# Patient Record
Sex: Female | Born: 1960
Health system: Southern US, Community
[De-identification: ages and names within clinical notes are randomized; demographics above are authoritative.]

## PROBLEM LIST (undated history)

## (undated) DIAGNOSIS — J45909 Unspecified asthma, uncomplicated: Secondary | ICD-10-CM

## (undated) DIAGNOSIS — F419 Anxiety disorder, unspecified: Secondary | ICD-10-CM

## (undated) DIAGNOSIS — E669 Obesity, unspecified: Secondary | ICD-10-CM

## (undated) DIAGNOSIS — I10 Essential (primary) hypertension: Secondary | ICD-10-CM

## (undated) DIAGNOSIS — I639 Cerebral infarction, unspecified: Secondary | ICD-10-CM

## (undated) HISTORY — DX: Anxiety disorder, unspecified: F41.9

## (undated) HISTORY — PX: NOSE SURGERY: SHX723

## (undated) HISTORY — PX: TUBAL LIGATION: SHX77

---

## 2007-08-18 ENCOUNTER — Emergency Department (HOSPITAL_COMMUNITY): Admission: EM | Admit: 2007-08-18 | Discharge: 2007-08-18 | Payer: Self-pay | Admitting: Emergency Medicine

## 2009-05-23 ENCOUNTER — Emergency Department (HOSPITAL_COMMUNITY): Admission: EM | Admit: 2009-05-23 | Discharge: 2009-05-23 | Payer: Self-pay | Admitting: Emergency Medicine

## 2011-02-15 ENCOUNTER — Emergency Department (HOSPITAL_COMMUNITY)
Admission: EM | Admit: 2011-02-15 | Discharge: 2011-02-15 | Disposition: A | Discharge status: Home / Self Care | Payer: Self-pay | Attending: Emergency Medicine | Admitting: Emergency Medicine

## 2011-02-15 DIAGNOSIS — R209 Unspecified disturbances of skin sensation: Secondary | ICD-10-CM | POA: Insufficient documentation

## 2011-02-15 DIAGNOSIS — R040 Epistaxis: Secondary | ICD-10-CM | POA: Insufficient documentation

## 2011-02-15 DIAGNOSIS — I10 Essential (primary) hypertension: Secondary | ICD-10-CM | POA: Insufficient documentation

## 2011-02-15 LAB — BASIC METABOLIC PANEL
BUN: 12 mg/dL (ref 6–23)
CO2: 25 mEq/L (ref 19–32)
Calcium: 9.5 mg/dL (ref 8.4–10.5)
Creatinine, Ser: 0.69 mg/dL (ref 0.4–1.2)
GFR calc non Af Amer: >60 mL/min (ref >60)
Glucose, Bld: 103 mg/dL — ABNORMAL HIGH (ref 70–99)
Sodium: 139 mEq/L (ref 135–145)

## 2011-02-15 LAB — DIFFERENTIAL
Basophils Absolute: 0 10*3/uL (ref 0.0–0.1)
Eosinophils Relative: 1 % (ref 0–5)
Lymphocytes Relative: 31 % (ref 12–46)
Monocytes Absolute: 0.7 10*3/uL (ref 0.1–1.0)
Monocytes Relative: 6 % (ref 3–12)
Neutro Abs: 6.9 10*3/uL (ref 1.7–7.7)

## 2011-02-15 LAB — CBC
HCT: 33.7 % — ABNORMAL LOW (ref 36.0–46.0)
Hemoglobin: 10.5 g/dL — ABNORMAL LOW (ref 12.0–15.0)
MCH: 23.6 pg — ABNORMAL LOW (ref 26.0–34.0)
MCHC: 31.2 g/dL (ref 30.0–36.0)
RDW: 15.6 % — ABNORMAL HIGH (ref 11.5–15.5)

## 2011-10-06 ENCOUNTER — Emergency Department (HOSPITAL_COMMUNITY): Payer: Self-pay

## 2011-10-06 ENCOUNTER — Emergency Department (HOSPITAL_COMMUNITY)
Admission: EM | Admit: 2011-10-06 | Discharge: 2011-10-06 | Disposition: A | Discharge status: Home / Self Care | Payer: Self-pay | Attending: Emergency Medicine | Admitting: Emergency Medicine

## 2011-10-06 DIAGNOSIS — I10 Essential (primary) hypertension: Secondary | ICD-10-CM | POA: Insufficient documentation

## 2011-10-06 DIAGNOSIS — M79609 Pain in unspecified limb: Secondary | ICD-10-CM | POA: Insufficient documentation

## 2011-10-06 DIAGNOSIS — S82839A Other fracture of upper and lower end of unspecified fibula, initial encounter for closed fracture: Secondary | ICD-10-CM | POA: Insufficient documentation

## 2011-10-06 DIAGNOSIS — X58XXXA Exposure to other specified factors, initial encounter: Secondary | ICD-10-CM | POA: Insufficient documentation

## 2011-10-06 DIAGNOSIS — M25569 Pain in unspecified knee: Secondary | ICD-10-CM | POA: Insufficient documentation

## 2012-01-04 HISTORY — PX: WRIST FRACTURE SURGERY: SHX121

## 2012-01-07 ENCOUNTER — Emergency Department (HOSPITAL_COMMUNITY): Payer: Self-pay

## 2012-01-07 ENCOUNTER — Encounter (HOSPITAL_COMMUNITY): Payer: Self-pay | Admitting: Emergency Medicine

## 2012-01-07 ENCOUNTER — Emergency Department (HOSPITAL_COMMUNITY)
Admission: EM | Admit: 2012-01-07 | Discharge: 2012-01-08 | Disposition: A | Discharge status: Home / Self Care | Payer: Self-pay | Attending: Emergency Medicine | Admitting: Emergency Medicine

## 2012-01-07 DIAGNOSIS — S52121A Displaced fracture of head of right radius, initial encounter for closed fracture: Secondary | ICD-10-CM

## 2012-01-07 DIAGNOSIS — F17200 Nicotine dependence, unspecified, uncomplicated: Secondary | ICD-10-CM | POA: Insufficient documentation

## 2012-01-07 DIAGNOSIS — S52509A Unspecified fracture of the lower end of unspecified radius, initial encounter for closed fracture: Secondary | ICD-10-CM | POA: Insufficient documentation

## 2012-01-07 DIAGNOSIS — Y92009 Unspecified place in unspecified non-institutional (private) residence as the place of occurrence of the external cause: Secondary | ICD-10-CM | POA: Insufficient documentation

## 2012-01-07 DIAGNOSIS — I10 Essential (primary) hypertension: Secondary | ICD-10-CM | POA: Insufficient documentation

## 2012-01-07 DIAGNOSIS — M25539 Pain in unspecified wrist: Secondary | ICD-10-CM | POA: Insufficient documentation

## 2012-01-07 DIAGNOSIS — M25569 Pain in unspecified knee: Secondary | ICD-10-CM | POA: Insufficient documentation

## 2012-01-07 DIAGNOSIS — W108XXA Fall (on) (from) other stairs and steps, initial encounter: Secondary | ICD-10-CM | POA: Insufficient documentation

## 2012-01-07 DIAGNOSIS — M25469 Effusion, unspecified knee: Secondary | ICD-10-CM | POA: Insufficient documentation

## 2012-01-07 DIAGNOSIS — M25439 Effusion, unspecified wrist: Secondary | ICD-10-CM | POA: Insufficient documentation

## 2012-01-07 DIAGNOSIS — W19XXXA Unspecified fall, initial encounter: Secondary | ICD-10-CM

## 2012-01-07 HISTORY — DX: Obesity, unspecified: E66.9

## 2012-01-07 HISTORY — DX: Essential (primary) hypertension: I10

## 2012-01-07 MED ORDER — HYDROCODONE-ACETAMINOPHEN 5-325 MG PO TABS: 1.0000 | ORAL_TABLET | ORAL | Status: DC | PRN

## 2012-01-07 MED ORDER — OXYCODONE-ACETAMINOPHEN 5-325 MG PO TABS
2.0000 | ORAL_TABLET | Freq: Once | ORAL | Status: AC
  Administered 2012-01-07: 2 via ORAL
  Filled 2012-01-07: qty 2

## 2012-01-07 NOTE — ED Notes (Signed)
Splint being applied  °

## 2012-01-07 NOTE — ED Notes (Signed)
Patient transported to X-ray 

## 2012-01-07 NOTE — ED Notes (Signed)
PT ambulated with a steady gait; VSS; splint applied and pulses intact; no signs of distress; A&Ox3; respirations even and unlabored; skin warm and dry; no questions.

## 2012-01-07 NOTE — ED Notes (Signed)
Ortho tech paged  

## 2012-01-07 NOTE — ED Provider Notes (Signed)
History     CSN: [id number]  Arrival date & time 01/07/12  2032   First MD Initiated Contact with Patient 01/07/12 2228      Chief Complaint  Patient presents with  . Fall     HPI  History provider the patient. Patient is a 51 year old female with history of hypertension obesity who presents after a fall with complaints of right wrist pain and right knee pain. Patient reports walking up or 3 steps into her house and tripping causing her to fall. She also states that her right knee seemed to give out on her some as she came down. Patient hit her right knee against a cement ground in her right wrist. She complains of greatest pain in the right wrist with swelling. Patient has been ambulatory since fall. She has not used any medications or had any treatment for pain. She denies any numbness or tingling in her hands and fingers. Patient denies any head trauma or LOC. Patient has no other complaints.    Past Medical History  Diagnosis Date  . Hypertension   . Obesity     History reviewed. No pertinent past surgical history.  No family history on file.  History  Substance Use Topics  . Smoking status: Current Everyday Smoker  . Smokeless tobacco: Not on file  . Alcohol Use: Yes    OB History    Grav Para Term Preterm Abortions TAB SAB Ect Mult Living                  Review of Systems  All other systems reviewed and are negative.    Allergies  Review of patient's allergies indicates no known allergies.  Home Medications   Current Outpatient Rx  Name Route Sig Dispense Refill  . NAPROXEN 500 MG PO TABS Oral Take 500 mg by mouth 2 (two) times daily with a meal.      BP 162/108  Pulse 91  Temp(Src) 98.2 F (36.8 C) (Oral)  Resp 22  SpO2 95%  LMP 12/06/2011  Physical Exam  Nursing note and vitals reviewed. Constitutional: She is oriented to person, place, and time. She appears well-developed and well-nourished. No distress.  HENT:  Head: Normocephalic and  atraumatic.  Cardiovascular: Normal rate and regular rhythm.   Pulmonary/Chest: Effort normal and breath sounds normal.  Abdominal: Soft.  Musculoskeletal:       Mild swelling to the lateral inferior aspect of right knee. Full range of motion of the knee. No crepitus or popping. No gross deformity the knee. She is able to bear weight and ambulate. Swelling over dorsal aspect of right wrist with tenderness to palpation. decreased range of motion and wrists secondary swelling and pain. Normal distal sensations and fingers with normal cap refill.  Neurological: She is alert and oriented to person, place, and time.  Skin: Skin is warm and dry. No rash noted.  Psychiatric: She has a normal mood and affect. Her behavior is normal.    ED Course  Procedures    Labs Reviewed - No data to display Dg Wrist Complete Right  01/07/2012  *RADIOLOGY REPORT*  Clinical Data: Pain and swelling post fall.  RIGHT WRIST - COMPLETE 3+ VIEW  Comparison: None.  Findings: There is a transverse minimally comminuted fracture of the distal radial metaphysis. A component of the fracture extends to the distal radial articular surface subchondral cortex, with approximate 2 mm distraction.  There is impaction of fracture fragments, with neutral angulation of the distal radial  articular surface.  There is an oblique minimally displaced fracture of the ulnar styloid.  Carpal rows intact.  Normal mineralization and alignment.  No significant osseous degenerative changes.  IMPRESSION:  1.  Comminuted impacted intra-articular fracture of the distal radius. 2.  Minimally-displaced ulnar styloid fracture.  Original Report Authenticated By: Osa Craver, M.D.   Dg Knee Complete 4 Views Right  01/07/2012  *RADIOLOGY REPORT*  Clinical Data: Right anterior knee pain status post fall  RIGHT KNEE - COMPLETE 4+ VIEW  Comparison: 10/06/2011  Findings: Tricompartmental DJD. Small joint effusion.  No acute fracture or dislocation  identified.  IMPRESSION: No acute fracture identified.  Tricompartmental DJD.If clinical concern for a fracture persists, recommend a repeat radiograph in 5- 10 days to evaluate for interval change or callus formation.  Original Report Authenticated By: Waneta Martins, M.D.     1. Fall   2. Fracture of radial head, right, closed       MDM  10:30 PM patient seen and evaluated. Patient no acute distress.   Patient discussed with attending physician and x-rays reviewed. Plan to put patient in Sugar tong splint and give orthopedic hand followup.     Angus Seller, [place] 01/08/12 [phone]

## 2012-01-07 NOTE — ED Notes (Signed)
Pt. fell today and injured right wrist , no loc , ambulatory, presents with right wrist swelling /pain .

## 2012-01-08 NOTE — ED Provider Notes (Signed)
Medical screening examination/treatment/procedure(s) were conducted as a shared visit with non-physician practitioner(s) and myself.  I personally evaluated the patient during the encounter    Celene Kras, MD 01/08/12 1102

## 2012-01-11 ENCOUNTER — Encounter (HOSPITAL_COMMUNITY): Payer: Self-pay

## 2012-01-11 ENCOUNTER — Encounter (HOSPITAL_COMMUNITY)
Admission: RE | Admit: 2012-01-11 | Discharge: 2012-01-11 | Disposition: A | Discharge status: Home / Self Care | Payer: Self-pay | Source: Ambulatory Visit | Attending: Orthopedic Surgery | Admitting: Orthopedic Surgery

## 2012-01-11 ENCOUNTER — Other Ambulatory Visit: Payer: Self-pay | Admitting: Orthopedic Surgery

## 2012-01-11 ENCOUNTER — Encounter (HOSPITAL_COMMUNITY)
Admission: RE | Admit: 2012-01-11 | Discharge: 2012-01-11 | Disposition: A | Discharge status: Home / Self Care | Payer: Self-pay | Source: Ambulatory Visit | Attending: Anesthesiology | Admitting: Anesthesiology

## 2012-01-11 ENCOUNTER — Other Ambulatory Visit: Payer: Self-pay

## 2012-01-11 LAB — COMPREHENSIVE METABOLIC PANEL
ALT: 21 U/L (ref 0–35)
AST: 22 U/L (ref 0–37)
CO2: 29 mEq/L (ref 19–32)
Calcium: 9.7 mg/dL (ref 8.4–10.5)
Chloride: 104 mEq/L (ref 96–112)
Creatinine, Ser: 0.66 mg/dL (ref 0.50–1.10)
GFR calc Af Amer: >90 mL/min (ref >90)
GFR calc non Af Amer: >90 mL/min (ref >90)
Glucose, Bld: 83 mg/dL (ref 70–99)
Sodium: 139 mEq/L (ref 135–145)
Total Bilirubin: 0.2 mg/dL — ABNORMAL LOW (ref 0.3–1.2)

## 2012-01-11 LAB — SURGICAL PCR SCREEN
MRSA, PCR: NEGATIVE
Staphylococcus aureus: NEGATIVE

## 2012-01-11 LAB — PROTIME-INR
INR: 1.02 (ref 0.00–1.49)
Prothrombin Time: 13.6 seconds (ref 11.6–15.2)

## 2012-01-11 LAB — CBC
MCH: 24.8 pg — ABNORMAL LOW (ref 26.0–34.0)
MCHC: 31.1 g/dL (ref 30.0–36.0)
Platelets: 266 10*3/uL (ref 150–400)

## 2012-01-11 NOTE — Progress Notes (Signed)
NOTIFIED ALLISON ZELENAK PA OF PATIENT  REPORTING DRINKING ALCOHOL DAILY AND REPORTING HYPERTENSION BUT HAS NO MEDICAL MD TO GIVE RX. LABS, CXR, EKG WERE DONE AS ORDERED.

## 2012-01-11 NOTE — Pre-Procedure Instructions (Signed)
20 SONNI BARSE  01/11/2012   Your procedure is scheduled on:  Saturday January 12, 2012  Report to [facility] at 0600 AM.  Call this number if you have problems the morning of surgery: [phone]   Remember:   Do not eat food:After Midnight.  May have clear liquids: up to 4 Hours before arrival.  (up to 2:00am)  Clear liquids include soda, tea, black coffee, apple or grape juice, broth.  Take these medicines the morning of surgery with A SIP OF WATER: hydrocodone   Do not wear jewelry, make-up or nail polish.  Do not wear lotions, powders, or perfumes. You may wear deodorant.  Do not shave 48 hours prior to surgery.  Do not bring valuables to the hospital.  Contacts, dentures or bridgework may not be worn into surgery.  Leave suitcase in the car. After surgery it may be brought to your room.  For patients admitted to the hospital, checkout time is 11:00 AM the day of discharge.   Patients discharged the day of surgery will not be allowed to drive home.  Name and phone number of your driver:  Family / friend  Special Instructions: CHG Shower Use Special Wash: 1/2 bottle night before surgery and 1/2 bottle morning of surgery.   Please read over the following fact sheets that you were given: Pain Booklet, Coughing and Deep Breathing, MRSA Information and Surgical Site Infection Prevention

## 2012-01-11 NOTE — Progress Notes (Signed)
REQUESTED ORDERS FROM DR [facility] OFFICE, [place] TO FAX.

## 2012-01-12 ENCOUNTER — Ambulatory Visit (HOSPITAL_COMMUNITY)
Admission: RE | Admit: 2012-01-12 | Discharge: 2012-01-12 | Disposition: A | Discharge status: Home / Self Care | Payer: Self-pay | Source: Ambulatory Visit | Attending: Orthopedic Surgery | Admitting: Orthopedic Surgery

## 2012-01-12 ENCOUNTER — Ambulatory Visit (HOSPITAL_COMMUNITY): Payer: Self-pay | Admitting: Anesthesiology

## 2012-01-12 ENCOUNTER — Encounter (HOSPITAL_COMMUNITY)
Admission: RE | Disposition: A | Discharge status: Home / Self Care | Payer: Self-pay | Source: Ambulatory Visit | Attending: Orthopedic Surgery

## 2012-01-12 ENCOUNTER — Encounter (HOSPITAL_COMMUNITY): Payer: Self-pay | Admitting: Anesthesiology

## 2012-01-12 ENCOUNTER — Encounter (HOSPITAL_COMMUNITY): Payer: Self-pay | Admitting: *Deleted

## 2012-01-12 DIAGNOSIS — Z01810 Encounter for preprocedural cardiovascular examination: Secondary | ICD-10-CM | POA: Insufficient documentation

## 2012-01-12 DIAGNOSIS — I10 Essential (primary) hypertension: Secondary | ICD-10-CM | POA: Insufficient documentation

## 2012-01-12 DIAGNOSIS — X58XXXA Exposure to other specified factors, initial encounter: Secondary | ICD-10-CM | POA: Insufficient documentation

## 2012-01-12 DIAGNOSIS — S52599A Other fractures of lower end of unspecified radius, initial encounter for closed fracture: Secondary | ICD-10-CM | POA: Insufficient documentation

## 2012-01-12 DIAGNOSIS — K219 Gastro-esophageal reflux disease without esophagitis: Secondary | ICD-10-CM | POA: Insufficient documentation

## 2012-01-12 DIAGNOSIS — E669 Obesity, unspecified: Secondary | ICD-10-CM | POA: Insufficient documentation

## 2012-01-12 DIAGNOSIS — S52501A Unspecified fracture of the lower end of right radius, initial encounter for closed fracture: Secondary | ICD-10-CM

## 2012-01-12 DIAGNOSIS — Z01818 Encounter for other preprocedural examination: Secondary | ICD-10-CM | POA: Insufficient documentation

## 2012-01-12 DIAGNOSIS — F17200 Nicotine dependence, unspecified, uncomplicated: Secondary | ICD-10-CM | POA: Insufficient documentation

## 2012-01-12 DIAGNOSIS — Z01812 Encounter for preprocedural laboratory examination: Secondary | ICD-10-CM | POA: Insufficient documentation

## 2012-01-12 HISTORY — PX: CARPAL TUNNEL RELEASE: SHX101

## 2012-01-12 SURGERY — OPEN REDUCTION INTERNAL FIXATION (ORIF) DISTAL RADIUS FRACTURE
Anesthesia: General | Laterality: Right

## 2012-01-12 MED ORDER — MIDAZOLAM HCL 5 MG/5ML IJ SOLN
INTRAMUSCULAR | Status: DC | PRN
  Administered 2012-01-12: 2 mg via INTRAVENOUS

## 2012-01-12 MED ORDER — CEFAZOLIN SODIUM 1-5 GM-% IV SOLN: 1.0000 g | INTRAVENOUS | Status: DC

## 2012-01-12 MED ORDER — CEFAZOLIN SODIUM-DEXTROSE 2-3 GM-% IV SOLR
INTRAVENOUS | Status: AC
  Administered 2012-01-12: 2 g via INTRAVENOUS
  Filled 2012-01-12: qty 50

## 2012-01-12 MED ORDER — LIDOCAINE HCL (CARDIAC) 20 MG/ML IV SOLN
INTRAVENOUS | Status: DC | PRN
  Administered 2012-01-12: 50 mg via INTRAVENOUS

## 2012-01-12 MED ORDER — OXYCODONE-ACETAMINOPHEN 5-325 MG PO TABS: 1.0000 | ORAL_TABLET | ORAL | Status: AC | PRN

## 2012-01-12 MED ORDER — ONDANSETRON HCL 4 MG/2ML IJ SOLN: 4.0000 mg | Freq: Four times a day (QID) | INTRAMUSCULAR | Status: DC | PRN

## 2012-01-12 MED ORDER — EPHEDRINE SULFATE 50 MG/ML IJ SOLN
INTRAMUSCULAR | Status: DC | PRN
  Administered 2012-01-12: 5 mg via INTRAVENOUS

## 2012-01-12 MED ORDER — LABETALOL HCL 5 MG/ML IV SOLN
INTRAVENOUS | Status: DC | PRN
  Administered 2012-01-12: 5 mg via INTRAVENOUS

## 2012-01-12 MED ORDER — HYDROMORPHONE HCL PF 1 MG/ML IJ SOLN
0.2500 mg | INTRAMUSCULAR | Status: DC | PRN
  Administered 2012-01-12 (×3): 0.5 mg via INTRAVENOUS

## 2012-01-12 MED ORDER — PROPOFOL 10 MG/ML IV EMUL
INTRAVENOUS | Status: DC | PRN
  Administered 2012-01-12: 160 mg via INTRAVENOUS

## 2012-01-12 MED ORDER — HYDROMORPHONE HCL PF 1 MG/ML IJ SOLN
INTRAMUSCULAR | Status: AC
  Administered 2012-01-12: 0.5 mg via INTRAVENOUS
  Filled 2012-01-12: qty 1

## 2012-01-12 MED ORDER — FENTANYL CITRATE 0.05 MG/ML IJ SOLN
INTRAMUSCULAR | Status: DC | PRN
  Administered 2012-01-12 (×2): 50 ug via INTRAVENOUS
  Administered 2012-01-12: 100 ug via INTRAVENOUS
  Administered 2012-01-12: 50 ug via INTRAVENOUS

## 2012-01-12 MED ORDER — BUPIVACAINE HCL (PF) 0.25 % IJ SOLN
INTRAMUSCULAR | Status: DC | PRN
  Administered 2012-01-12: 10 mL

## 2012-01-12 MED ORDER — CHLORHEXIDINE GLUCONATE 4 % EX LIQD: 60.0000 mL | Freq: Once | CUTANEOUS | Status: DC

## 2012-01-12 MED ORDER — LACTATED RINGERS IV SOLN
INTRAVENOUS | Status: DC | PRN
  Administered 2012-01-12 (×2): via INTRAVENOUS

## 2012-01-12 MED ORDER — ACETAMINOPHEN 10 MG/ML IV SOLN
1000.0000 mg | Freq: Once | INTRAVENOUS | Status: AC
  Administered 2012-01-12: 1000 mg via INTRAVENOUS

## 2012-01-12 MED ORDER — SUCCINYLCHOLINE CHLORIDE 20 MG/ML IJ SOLN
INTRAMUSCULAR | Status: DC | PRN
  Administered 2012-01-12: 120 mg via INTRAVENOUS

## 2012-01-12 MED ORDER — ACETAMINOPHEN 10 MG/ML IV SOLN
INTRAVENOUS | Status: AC
  Administered 2012-01-12: 1000 mg via INTRAVENOUS
  Filled 2012-01-12: qty 100

## 2012-01-12 MED ORDER — ONDANSETRON HCL 4 MG/2ML IJ SOLN
INTRAMUSCULAR | Status: DC | PRN
  Administered 2012-01-12: 4 mg via INTRAVENOUS

## 2012-01-12 SURGICAL SUPPLY — 52 items
APL SKNCLS STERI-STRIP NONHPOA (GAUZE/BANDAGES/DRESSINGS) ×1
BANDAGE ACE 4 STERILE (GAUZE/BANDAGES/DRESSINGS) ×1 IMPLANT
BANDAGE ELASTIC 3 VELCRO ST LF (GAUZE/BANDAGES/DRESSINGS) ×2 IMPLANT
BANDAGE GAUZE ELAST BULKY 4 IN (GAUZE/BANDAGES/DRESSINGS) ×2 IMPLANT
BENZOIN TINCTURE PRP APPL 2/3 (GAUZE/BANDAGES/DRESSINGS) ×1 IMPLANT
BNDG CMPR 9X4 STRL LF SNTH (GAUZE/BANDAGES/DRESSINGS) ×1
BNDG ESMARK 4X9 LF (GAUZE/BANDAGES/DRESSINGS) ×2 IMPLANT
CLOSURE STERI STRIP 1/2 X4 (GAUZE/BANDAGES/DRESSINGS) ×1 IMPLANT
CLOTH BEACON ORANGE TIMEOUT ST (SAFETY) ×2 IMPLANT
CORDS BIPOLAR (ELECTRODE) ×2 IMPLANT
COVER MAYO STAND STRL (DRAPES) IMPLANT
COVER SURGICAL LIGHT HANDLE (MISCELLANEOUS) ×2 IMPLANT
CUFF TOURNIQUET SINGLE 18IN (TOURNIQUET CUFF) IMPLANT
CUFF TOURNIQUET SINGLE 24IN (TOURNIQUET CUFF) IMPLANT
DRAPE SURG 17X23 STRL (DRAPES) ×2 IMPLANT
DURAPREP 26ML APPLICATOR (WOUND CARE) ×2 IMPLANT
GAUZE KERLIX 2  STERILE LF (GAUZE/BANDAGES/DRESSINGS) ×1 IMPLANT
GAUZE SPONGE 4X4 12PLY STRL LF (GAUZE/BANDAGES/DRESSINGS) ×1 IMPLANT
GAUZE XEROFORM 1X8 LF (GAUZE/BANDAGES/DRESSINGS) ×2 IMPLANT
GLOVE BIO SURGEON STRL SZ8.5 (GLOVE) ×2 IMPLANT
GOWN PREVENTION PLUS XXLARGE (GOWN DISPOSABLE) ×2 IMPLANT
GOWN SRG XL XLNG 56XLVL 4 (GOWN DISPOSABLE) ×1 IMPLANT
GOWN STRL NON-REIN LRG LVL3 (GOWN DISPOSABLE) ×2 IMPLANT
GOWN STRL NON-REIN XL XLG LVL4 (GOWN DISPOSABLE) ×2
KIT BASIN OR (CUSTOM PROCEDURE TRAY) ×2 IMPLANT
KIT ROOM TURNOVER OR (KITS) ×2 IMPLANT
NDL HYPO 25GX1X1/2 BEV (NEEDLE) IMPLANT
NEEDLE 22X1 1/2 (OR ONLY) (NEEDLE) IMPLANT
NEEDLE HYPO 25GX1X1/2 BEV (NEEDLE) IMPLANT
NS IRRIG 1000ML POUR BTL (IV SOLUTION) ×2 IMPLANT
PACK ORTHO EXTREMITY (CUSTOM PROCEDURE TRAY) ×2 IMPLANT
PAD ARMBOARD 7.5X6 YLW CONV (MISCELLANEOUS) ×4 IMPLANT
PAD CAST 4YDX4 CTTN HI CHSV (CAST SUPPLIES) ×1 IMPLANT
PADDING CAST COTTON 4X4 STRL (CAST SUPPLIES) ×2
PADDING WEBRIL 4 STERILE (GAUZE/BANDAGES/DRESSINGS) ×1 IMPLANT
PEG SUBCHONDRAL SMOOTH 2.0X20 (Peg) ×4 IMPLANT
PEG SUBCHONDRAL SMOOTH 2.0X22 (Peg) ×2 IMPLANT
PLATE F3 FRAG HI STR RT (Plate) ×1 IMPLANT
SCREW BN 12X3.5XNS CORT TI (Screw) IMPLANT
SCREW CORT 3.5X12 (Screw) ×2 IMPLANT
SCREW CORT 3.5X14 LNG (Screw) ×2 IMPLANT
SPONGE GAUZE 4X4 12PLY (GAUZE/BANDAGES/DRESSINGS) ×2 IMPLANT
STRIP CLOSURE SKIN 1/2X4 (GAUZE/BANDAGES/DRESSINGS) ×2 IMPLANT
SUT ETHILON 4 0 PS 2 18 (SUTURE) IMPLANT
SUT PROLENE 3 0 PS 2 (SUTURE) IMPLANT
SUT VIC AB 3-0 PS2 18 (SUTURE)
SUT VIC AB 3-0 PS2 18XBRD (SUTURE) IMPLANT
SYR CONTROL 10ML LL (SYRINGE) IMPLANT
TOWEL OR 17X24 6PK STRL BLUE (TOWEL DISPOSABLE) ×2 IMPLANT
TOWEL OR 17X26 10 PK STRL BLUE (TOWEL DISPOSABLE) ×2 IMPLANT
UNDERPAD 30X30 INCONTINENT (UNDERPADS AND DIAPERS) ×2 IMPLANT
WATER STERILE IRR 1000ML POUR (IV SOLUTION) ×2 IMPLANT

## 2012-01-12 NOTE — Progress Notes (Signed)
Dr. Chaney Malling updated concerning pain level. Acetominophen IV ordered and hung dw

## 2012-01-12 NOTE — Progress Notes (Signed)
Pt states pain still  9-10/10. resp rate and 02 sat dropping to lower levels for short intervals . Will hold additional pain med for now. Encouraging deep breathing and coughing. Discussed with patient and stated understanding.

## 2012-01-12 NOTE — Brief Op Note (Signed)
01/12/2012  8:43 AM  PATIENT:  Kathleen [name]  51 y.o. female  PRE-OPERATIVE DIAGNOSIS:  RIGHT DISTAL RADIUS FRACTURE  POST-OPERATIVE DIAGNOSIS:  right distal radius fracture  PROCEDURE:  Procedure(s): OPEN REDUCTION INTERNAL FIXATION (ORIF) DISTAL RADIAL FRACTURE CARPAL TUNNEL RELEASE  SURGEON:  Surgeon(s): Marlowe Shores, MD  PHYSICIAN ASSISTANT:   ASSISTANTS: none   ANESTHESIA:   general  EBL:     BLOOD ADMINISTERED:none  DRAINS: none   LOCAL MEDICATIONS USED:  MARCAINE 10CC  SPECIMEN:  No Specimen  DISPOSITION OF SPECIMEN:  N/A  COUNTS:  YES  TOURNIQUET:   Total Tourniquet Time Documented: Upper Arm (Right) - 48 minutes  DICTATION: .Other Dictation: Dictation Number [id number]  PLAN OF CARE: Discharge to home after PACU  PATIENT DISPOSITION:  PACU - hemodynamically stable.   Delay start of Pharmacological VTE agent (>24hrs) due to surgical blood loss or risk of bleeding:  {YES/NO/NOT APPLICABLE:20182

## 2012-01-12 NOTE — Anesthesia Preprocedure Evaluation (Addendum)
Anesthesia Evaluation  Patient identified by MRN, date of birth, ID band Patient awake    Reviewed: Allergy & Precautions, H&P , NPO status , Patient's Chart, lab work & pertinent test results  History of Anesthesia Complications (+) AWARENESS UNDER ANESTHESIA  Airway Mallampati: III  Neck ROM: limited    Dental   Pulmonary Current Smoker,          Cardiovascular hypertension,     Neuro/Psych    GI/Hepatic GERD-  Poorly Controlled,  Endo/Other    Renal/GU      Musculoskeletal   Abdominal   Peds  Hematology   Anesthesia Other Findings   Reproductive/Obstetrics                          Anesthesia Physical Anesthesia Plan  ASA: II  Anesthesia Plan: General   Post-op Pain Management:    Induction: Intravenous  Airway Management Planned: Oral ETT  Additional Equipment:   Intra-op Plan:   Post-operative Plan: Extubation in OR  Informed Consent: I have reviewed the patients History and Physical, chart, labs and discussed the procedure including the risks, benefits and alternatives for the proposed anesthesia with the patient or authorized representative who has indicated his/her understanding and acceptance.     Plan Discussed with: CRNA and Surgeon  Anesthesia Plan Comments:         Anesthesia Quick Evaluation

## 2012-01-12 NOTE — Anesthesia Procedure Notes (Signed)
Procedure Name: Intubation Date/Time: 01/12/2012 7:39 AM Performed by: Romie Minus Pre-anesthesia Checklist: Patient identified, Emergency Drugs available, Suction available and Patient being monitored Patient Re-evaluated:Patient Re-evaluated prior to inductionOxygen Delivery Method: Circle System Utilized Preoxygenation: Pre-oxygenation with 100% oxygen Intubation Type: IV induction and Rapid sequence Laryngoscope Size: Miller and 2 Grade View: Grade I Tube type: Oral Tube size: 7.5 mm Number of attempts: 1 Placement Confirmation: ETT inserted through vocal cords under direct vision,  positive ETCO2 and breath sounds checked- equal and bilateral Secured at: 21 cm Tube secured with: Tape Dental Injury: Teeth and Oropharynx as per pre-operative assessment

## 2012-01-12 NOTE — Anesthesia Postprocedure Evaluation (Signed)
Anesthesia Post Note  Patient: Kathleen [name]  Procedure(s) Performed:  OPEN REDUCTION INTERNAL FIXATION (ORIF) DISTAL RADIAL FRACTURE; CARPAL TUNNEL RELEASE  Anesthesia type: General  Patient location: PACU  Post pain: Pain level controlled and Adequate analgesia  Post assessment: Post-op Vital signs reviewed, Patient's Cardiovascular Status Stable, Respiratory Function Stable, Patent Airway and Pain level controlled  Last Vitals:  Filed Vitals:   01/12/12 0919  BP: 145/96  Pulse: 80  Temp:   Resp: 15    Post vital signs: Reviewed and stable  Level of consciousness: awake, alert  and oriented  Complications: No apparent anesthesia complications

## 2012-01-12 NOTE — Preoperative (Signed)
Beta Blockers   Reason not to administer Beta Blockers:Not Applicable 

## 2012-01-12 NOTE — Addendum Note (Signed)
Addendum  created 01/12/12 1610 by Raiford Simmonds, MD   Modules edited:Orders

## 2012-01-12 NOTE — Op Note (Signed)
NAMEINZA, MIKRUT               ACCOUNT NO.:  [id number]  MEDICAL RECORD NO.:  [id number]  LOCATION:  [facility]                         FACILITY:  [facility]  PHYSICIAN:  Artist Pais. Lerlene Treadwell, M.D.DATE OF BIRTH:  February 22, 1961  DATE OF PROCEDURE:  01/12/2012 DATE OF DISCHARGE:                              OPERATIVE REPORT   PREOPERATIVE DIAGNOSIS:  Displaced intra-articular fracture distal radius, right side.  POSTOPERATIVE DIAGNOSIS:  Displaced intra-articular fracture distal radius, right side.  PROCEDURE:  Open reduction and internal fixation above using DVR plate and screws, carpal tunnel release.  SURGEON:  Artist Pais. Mina Marble, MD  ASSISTANT:  None.  ANESTHESIA:  General.  TOURNIQUET TIME:  46 minutes.  COMPLICATIONS:  No complication.  DRAINS:  No drains.  DESCRIPTION OF PROCEDURE:  The patient was taken to the operating suite. After induction of adequate general anesthesia, right upper extremity was prepped and draped in usual sterile fashion.  An Esmarch was used to exsanguinate the limb.  Tourniquet was inflated to 250 mmHg.  At this point in time, standard DVR volar aspect approach of the distal radius was undertaken.  The skin was incised off the FCR tendon.  The FCR tendon sheath was incised, was tracked in the midline radial artery on lateral side.  Dissection was carried down to the level of pronator quadratus which was bilaterally stripped off.  X-rays revealing a volar displaced intra-articular fracture, 2-3 parts.  The brachioradialis was released off the distal fragment to aid in reduction.  Reduction was performed with the wrist being extended over rolled up towels.  Once this was done, a DVR plate was fastened to the volar aspect of the distal radius into the slotted hole, a screw was placed.  Intraoperative fluoroscopy was used to determine adequate position in AP and lateral view.  Remaining cortical screws were placed proximally followed by smooth pegs  distally.  Intraoperative fluoroscopy again revealed adequate reduction in AP, lateral, and oblique view.  At this point in time, the wound was thoroughly irrigated.  The median nerve was identified in the proximal aspect of the wound traced in the carpal canal.  A path was created dorsal and volar to the transverse carpal ligament.  The median nerve was protected with a Therapist, nutritional and then the transverse carpal ligament was divided with curved blunt scissors. Canal was inspected.  No osseous lesion was present.  It was irrigated. The wounds were closed in layers of 2-0 undyed Vicryl to reapproximate the pronator quadratus and a 3-0 Prolene subcuticular stitch on the skin.  Steri-Strips, 4x4s, fluffs, and a volar splint was applied.  The patient tolerated the procedure well and went to recovery room in stable fashion.     Artist Pais Mina Marble, M.D.     MAW/MEDQ  D:  01/12/2012  T:  01/12/2012  Job:  [id number]

## 2012-01-12 NOTE — H&P (Signed)
Kathleen [name] is an 52 y.o. female.   Chief Complaint: right wrist pain HPI: 51 y/o female s/p fall with displaced right distal radius fracture  Past Medical History  Diagnosis Date  . Obesity   . Hypertension     NO MED MD TO GIVE RX    Past Surgical History  Procedure Date  . Nose surgery     BROKEN   MANY YRS AGO   . Tubal ligation   . Cesarean section     History reviewed. No pertinent family history. Social History:  reports that she has been smoking.  She does not have any smokeless tobacco history on file. She reports that she drinks alcohol. She reports that she does not use illicit drugs.  Allergies:  Allergies  Allergen Reactions  . Hydrocodone Itching    Medications Prior to Admission  Medication Dose Route Frequency Provider Last Rate Last Dose  . ceFAZolin (ANCEF) 2-3 GM-% IVPB SOLR           . ceFAZolin (ANCEF) IVPB 1 g/50 mL premix  1 g Intravenous 60 min Pre-Op Marlowe Shores, MD      . chlorhexidine (HIBICLENS) 4 % liquid 4 application  60 mL Topical Once Marlowe Shores, MD      . chlorhexidine (HIBICLENS) 4 % liquid 4 application  60 mL Topical Once        Medications Prior to Admission  Medication Sig Dispense Refill  . HYDROcodone-acetaminophen (NORCO) 5-325 MG per tablet Take 1 tablet by mouth every 4 (four) hours as needed. For pain        Results for orders placed during the hospital encounter of 01/11/12 (from the past 48 hour(s))  COMPREHENSIVE METABOLIC PANEL     Status: Abnormal   Collection Time   01/11/12  2:17 PM      Component Value Range Comment   Sodium 139  135 - 145 (mEq/L)    Potassium 3.6  3.5 - 5.1 (mEq/L)    Chloride 104  96 - 112 (mEq/L)    CO2 29  19 - 32 (mEq/L)    Glucose, Bld 83  70 - 99 (mg/dL)    BUN 15  6 - 23 (mg/dL)    Creatinine, Ser 9.56  0.50 - 1.10 (mg/dL)    Calcium 9.7  8.4 - 10.5 (mg/dL)    Total Protein 7.6  6.0 - 8.3 (g/dL)    Albumin 3.4 (*) 3.5 - 5.2 (g/dL)    AST 22  0 - 37 (U/L)    ALT 21  0  - 35 (U/L)    Alkaline Phosphatase 94  39 - 117 (U/L)    Total Bilirubin 0.2 (*) 0.3 - 1.2 (mg/dL)    GFR calc non Af Amer >90  >90 (mL/min)    GFR calc Af Amer >90  >90 (mL/min)   CBC     Status: Abnormal   Collection Time   01/11/12  2:17 PM      Component Value Range Comment   WBC 9.2  4.0 - 10.5 (K/uL)    RBC 4.91  3.87 - 5.11 (MIL/uL)    Hemoglobin 12.2  12.0 - 15.0 (g/dL)    HCT 21.3  08.6 - 57.8 (%)    MCV 79.8  78.0 - 100.0 (fL)    MCH 24.8 (*) 26.0 - 34.0 (pg)    MCHC 31.1  30.0 - 36.0 (g/dL)    RDW 46.9  62.9 - 52.8 (%)  Platelets 266  150 - 400 (K/uL)   PROTIME-INR     Status: Normal   Collection Time   01/11/12  2:17 PM      Component Value Range Comment   Prothrombin Time 13.6  11.6 - 15.2 (seconds)    INR 1.02  0.00 - 1.49    APTT     Status: Normal   Collection Time   01/11/12  2:17 PM      Component Value Range Comment   aPTT 25  24 - 37 (seconds)   HCG, SERUM, QUALITATIVE     Status: Normal   Collection Time   01/11/12  2:17 PM      Component Value Range Comment   Preg, Serum NEGATIVE  NEGATIVE    SURGICAL PCR SCREEN     Status: Normal   Collection Time   01/11/12  2:19 PM      Component Value Range Comment   MRSA, PCR NEGATIVE  NEGATIVE     Staphylococcus aureus NEGATIVE  NEGATIVE     Dg Chest 2 View  01/11/2012  *RADIOLOGY REPORT*  Clinical Data: Preop for carpal tunnel surgery, smoking history  CHEST - 2 VIEW  Comparison: None.  Findings: No active infiltrate or effusion is seen.  The heart is mildly enlarged. Mediastinal contours appear normal.  No acute bony abnormality is seen with degenerative change in the mid to lower thoracic spine.  IMPRESSION: No active lung disease.  Original Report Authenticated By: Juline Patch, M.D.    Review of Systems  Constitutional: Negative.   HENT: Negative.   Eyes: Negative.   Respiratory: Negative.   Cardiovascular: Negative.   Gastrointestinal: Negative.   Genitourinary: Negative.   Musculoskeletal: Negative.     Skin: Negative.   Neurological: Negative.   Endo/Heme/Allergies: Negative.   Psychiatric/Behavioral: Negative.     Blood pressure 159/111, pulse 76, temperature 98.1 F (36.7 C), temperature source Oral, resp. rate 22, last menstrual period 12/06/2011, SpO2 99.00%. Physical Exam  Constitutional: She is oriented to person, place, and time. She appears well-developed and well-nourished.  HENT:  Head: Normocephalic and atraumatic.  Eyes: Pupils are equal, round, and reactive to light.  Neck: Normal range of motion.  Cardiovascular: Normal rate.   Respiratory: Effort normal.  Musculoskeletal:       Right wrist: She exhibits tenderness, bony tenderness, swelling and deformity.  Neurological: She is alert and oriented to person, place, and time.  Skin: Skin is warm.  Psychiatric: She has a normal mood and affect. Her speech is normal and behavior is normal. Thought content normal.     Assessment/Plan 51 y/o female with right distal radius fracture  Plan orif  Kamau Weatherall A 01/12/2012, 7:22 AM

## 2012-01-12 NOTE — Discharge Instructions (Signed)
Wrist Fracture Your caregiver has diagnosed you as having a fracture of the wrist. A fracture is a break in the bone or bones. A cast or splint is used to protect and keep your injured bone(s) from moving. The cast or splint will usually be on for about 5 to 6 weeks. One of the bones of the wrist (the navicular bone) often does not show up as a fracture on X-ray until later or in the healing phase. With this bone your caregiver will often cast as though it is fractured even if not seen on the X-ray. HOME CARE INSTRUCTIONS   To lessen the swelling, keep the injured part elevated while sitting or lying down. Keeping the injury above the level of your heart (the center of the chest) will decrease swelling and pain.   Do not wear rings or jewelry on the injured hand or wrist.   Apply ice to the injury for 15 to 20 minutes, 3 to 4 times per day while awake for 2 days. Put the ice in a plastic bag and place a thin towel between the bag of ice and your cast.   If you have a plaster or fiberglass cast:   Do not try to scratch the skin under the cast using sharp or pointed objects.   Check the skin around the cast every day. You may put lotion on any red or sore areas.   Keep your cast dry and clean.   If you have a plaster splint:   Wear the splint as directed.   You may loosen the elastic around the splint if your fingers become numb, tingle, or turn cold or blue.   If you have been put in a removable splint, wear and use as directed.   Do not use powders or deodorants in or around the cast or splint.   Do not remove padding from your cast or splint.   Do not put pressure on any part of your cast or splint. It may break. Rest your cast or splint only on a pillow the first 24 hours until it is fully hardened.   Gently move your fingers often, so they do not get stiff.   Do not remove the splint unless directed by your caregiver. Casts must be removed by an orthopedist.   Your cast or  splint can be protected during bathing with a plastic bag. Do not lower the cast or splint into water.   Only take over-the-counter or prescription medicines for pain, discomfort, or fever as directed by your caregiver.   Follow up with your caregiver as directed.  SEEK IMMEDIATE MEDICAL CARE IF:   Your cast or splint gets damaged or breaks.   Your cast or splint feels too tight or loose.   You have increased pain, not controlled with medication.   You have increased swelling.   Your skin or nails below the injury turn blue or grey or feel cold or numb.   You have trouble moving or feeling your fingers.   You experience any burning or stinging from the cast or splint.   There is a bad smell coming from under the cast or splint.   New stains or fluids are coming from under the cast or splint.   You have any new injuries while wearing the cast or splint.  Document Released: 08/29/2005 Document Revised: 08/01/2011 Document Reviewed: 06/18/2007 [employer] Patient Information 2012 [employer]. 

## 2012-01-12 NOTE — Transfer of Care (Signed)
Immediate Anesthesia Transfer of Care Note  Patient: Kathleen [name]  Procedure(s) Performed:  OPEN REDUCTION INTERNAL FIXATION (ORIF) DISTAL RADIAL FRACTURE; CARPAL TUNNEL RELEASE  Patient Location: PACU  Anesthesia Type: General  Level of Consciousness: awake  Airway & Oxygen Therapy: Patient Spontanous Breathing and Patient connected to nasal cannula oxygen  Post-op Assessment: Report given to PACU RN and Post -op Vital signs reviewed and stable  Post vital signs: stable  Complications: No apparent anesthesia complications

## 2012-01-12 NOTE — Op Note (Signed)
See dictated note [phone]

## 2012-01-14 ENCOUNTER — Encounter (HOSPITAL_COMMUNITY): Payer: Self-pay | Admitting: Orthopedic Surgery

## 2012-10-17 ENCOUNTER — Emergency Department (HOSPITAL_COMMUNITY)
Admission: EM | Admit: 2012-10-17 | Discharge: 2012-10-17 | Disposition: A | Discharge status: Home / Self Care | Payer: Self-pay | Attending: Emergency Medicine | Admitting: Emergency Medicine

## 2012-10-17 ENCOUNTER — Encounter (HOSPITAL_COMMUNITY): Payer: Self-pay | Admitting: Family Medicine

## 2012-10-17 ENCOUNTER — Emergency Department (HOSPITAL_COMMUNITY): Payer: Self-pay

## 2012-10-17 DIAGNOSIS — F17200 Nicotine dependence, unspecified, uncomplicated: Secondary | ICD-10-CM | POA: Insufficient documentation

## 2012-10-17 DIAGNOSIS — E669 Obesity, unspecified: Secondary | ICD-10-CM | POA: Insufficient documentation

## 2012-10-17 DIAGNOSIS — M25569 Pain in unspecified knee: Secondary | ICD-10-CM

## 2012-10-17 DIAGNOSIS — M25579 Pain in unspecified ankle and joints of unspecified foot: Secondary | ICD-10-CM | POA: Insufficient documentation

## 2012-10-17 DIAGNOSIS — M79673 Pain in unspecified foot: Secondary | ICD-10-CM

## 2012-10-17 DIAGNOSIS — I10 Essential (primary) hypertension: Secondary | ICD-10-CM | POA: Insufficient documentation

## 2012-10-17 MED ORDER — OXYCODONE-ACETAMINOPHEN 5-325 MG PO TABS
1.0000 | ORAL_TABLET | Freq: Once | ORAL | Status: AC
  Administered 2012-10-17: 1 via ORAL
  Filled 2012-10-17: qty 1

## 2012-10-17 MED ORDER — OXYCODONE-ACETAMINOPHEN 5-325 MG PO TABS: 1.0000 | ORAL_TABLET | Freq: Four times a day (QID) | ORAL | Status: DC | PRN

## 2012-10-17 NOTE — ED Notes (Signed)
Pt c/o bilateral knee pain for "a long time". States pain is worse today.

## 2012-10-17 NOTE — Progress Notes (Signed)
Orthopedic Tech Progress Note Patient Details:  Kathleen [name] [birth year] [id number]  Ortho Devices Type of Ortho Device: Knee Immobilizer Ortho Device/Splint Location: left leg Ortho Device/Splint Interventions: Application   Nikki Dom 10/17/2012, 5:02 PM

## 2012-10-17 NOTE — ED Notes (Signed)
Pt is supposed to be on BP med but does not take it.

## 2012-10-17 NOTE — ED Provider Notes (Signed)
History   This chart was scribed for [employer]. Rubin Payor, MD, by Marcina Millard scribe. The patient was seen in room TR06C/TR06C and the patient's care was started at 1450.    CSN: [id number]  Arrival date & time 10/17/12  1344   First MD Initiated Contact with Patient 10/17/12 1450      Chief Complaint  Patient presents with  . Knee Pain    (Consider location/radiation/quality/duration/timing/severity/associated sxs/prior treatment) HPI Comments: Kathleen [name] is a 51 y.o. female who presents to the Emergency Department complaining of constant, gradually worsening bilateral knee pain. She states that has a h/o of bilateral knee pain, but the current pain worsened 3 days ago to the point where she is unable to move after she wakes up. She reports that her left knee, which is aggravated by applying pressure, is worse than her right knee and that it gives out periodically, She reports that she has fallen 3 times within the past year because of her left knee giving out with the last time resulting in breaking her right wrist. She reports that she has been sent home from work and has been to the ED for similar issues in the past. She denies any other issues with other joints.      Past Medical History  Diagnosis Date  . Obesity   . Hypertension     NO MED MD TO GIVE RX    Past Surgical History  Procedure Date  . Nose surgery     BROKEN   MANY YRS AGO   . Tubal ligation   . Cesarean section   . Carpal tunnel release 01/12/2012    Procedure: CARPAL TUNNEL RELEASE;  Surgeon: Marlowe Shores, MD;  Location: [facility] OR;  Service: Orthopedics;  Laterality: Right;    History reviewed. No pertinent family history.  History  Substance Use Topics  . Smoking status: Current Every Day Smoker  . Smokeless tobacco: Not on file  . Alcohol Use: Yes     Comment: DAILY ALCOHOL     OB History    Grav Para Term Preterm Abortions TAB SAB Ect Mult Living                  Review of  Systems  Constitutional: Negative for fever.  Musculoskeletal:       Knee and left ankle pain  All other systems reviewed and are negative.    Allergies  Hydrocodone  Home Medications   Current Outpatient Rx  Name  Route  Sig  Dispense  Refill  . IBUPROFEN 200 MG PO TABS   Oral   Take 800 mg by mouth every 6 (six) hours as needed. For pain         . OXYCODONE-ACETAMINOPHEN 5-325 MG PO TABS   Oral   Take 1-2 tablets by mouth every 6 (six) hours as needed for pain.   15 tablet   0     BP 154/114  Pulse 93  Temp 98 F (36.7 C)  Resp 18  SpO2 98%  LMP 12/20/2011  Physical Exam  Nursing note and vitals reviewed. Constitutional: She is oriented to person, place, and time. She appears well-developed and well-nourished. No distress.  HENT:  Head: Normocephalic and atraumatic.  Eyes: EOM are normal. Pupils are equal, round, and reactive to light.  Neck: Normal range of motion. Neck supple. No tracheal deviation present.  Cardiovascular: Normal rate.   Pulmonary/Chest: Effort normal. No respiratory distress.  Abdominal: Soft. She exhibits no  distension.  Musculoskeletal: Normal range of motion. She exhibits edema.       She has mild tenderness to right knee and moderate tenderness to the left knee. She also has moderate tenderness to the left heel. She also bilateral pitting edema bilaterally to both legs.   Neurological: She is alert and oriented to person, place, and time.  Skin: Skin is warm and dry.  Psychiatric: She has a normal mood and affect. Her behavior is normal.    ED Course  Procedures (including critical care time)   COORDINATION OF CARE:  15:18- Discussed planned course of treatment with the patient, including a left foot and knee X-ray,  who is agreeable at this time.  16:52- Recheck- Discussed the X-ray findings with the pt.  Results for orders placed during the hospital encounter of 10/17/12  URIC ACID      Component Value Range   Uric Acid,  Serum 3.8  2.4 - 7.0 mg/dL   Dg Knee Complete 4 Views Left  10/17/2012  *RADIOLOGY REPORT*  Clinical Data:  Generalized left knee pain  LEFT KNEE - COMPLETE 4+ VIEW  Comparison: None  Findings: Osseous demineralization. Osteoarthritic changes with joint space narrowing and spur formation, greatest at medial compartment. No acute fracture, dislocation or bone destruction. Knee joint effusion present.  IMPRESSION: Osteoarthritic changes with knee joint effusion. Osseous demineralization.   Original Report Authenticated By: Ulyses Southward, M.D.    Dg Foot Complete Left  10/17/2012  *RADIOLOGY REPORT*  Clinical Data: Left foot and heel pain  LEFT FOOT - COMPLETE 3+ VIEW  Comparison: None  Findings: Osseous demineralization. Minimal hallux valgus with overlying soft tissue swelling medially. Joint spaces preserved. Plantar calcaneal spur. No acute fracture, dislocation or bone destruction. Soft tissue swelling of foot and ankle.  IMPRESSION: No acute osseous abnormalities.   Original Report Authenticated By: Ulyses Southward, M.D.        Labs Reviewed  URIC ACID   Dg Knee Complete 4 Views Left  10/17/2012  *RADIOLOGY REPORT*  Clinical Data:  Generalized left knee pain  LEFT KNEE - COMPLETE 4+ VIEW  Comparison: None  Findings: Osseous demineralization. Osteoarthritic changes with joint space narrowing and spur formation, greatest at medial compartment. No acute fracture, dislocation or bone destruction. Knee joint effusion present.  IMPRESSION: Osteoarthritic changes with knee joint effusion. Osseous demineralization.   Original Report Authenticated By: Ulyses Southward, M.D.    Dg Foot Complete Left  10/17/2012  *RADIOLOGY REPORT*  Clinical Data: Left foot and heel pain  LEFT FOOT - COMPLETE 3+ VIEW  Comparison: None  Findings: Osseous demineralization. Minimal hallux valgus with overlying soft tissue swelling medially. Joint spaces preserved. Plantar calcaneal spur. No acute fracture, dislocation or bone  destruction. Soft tissue swelling of foot and ankle.  IMPRESSION: No acute osseous abnormalities.   Original Report Authenticated By: Ulyses Southward, M.D.      1. Knee pain   2. Foot pain       MDM  Patient with knee pain and foot pain. Has been going on for years but worse recently. She is an effusion on the left knee. X-ray does not show fracture. She'll be discharged with a knee immobilizer and Ortho follow up  I personally performed the services described in this documentation, which was scribed in my presence. The recorded information has been reviewed and is accurate.         Juliet Rude. Rubin Payor, MD 10/17/12 [phone]

## 2012-10-17 NOTE — ED Notes (Signed)
Per pt years of bilateral knee pain that is worsening.

## 2012-10-17 NOTE — ED Notes (Signed)
Patient is alert and orientedx4.  Patient was explained discharge instructions and she did not have any questions. 

## 2012-12-19 ENCOUNTER — Emergency Department (HOSPITAL_COMMUNITY)
Admission: EM | Admit: 2012-12-19 | Discharge: 2012-12-19 | Disposition: A | Discharge status: Home / Self Care | Payer: Self-pay | Attending: Emergency Medicine | Admitting: Emergency Medicine

## 2012-12-19 ENCOUNTER — Encounter (HOSPITAL_COMMUNITY): Payer: Self-pay | Admitting: Emergency Medicine

## 2012-12-19 DIAGNOSIS — E669 Obesity, unspecified: Secondary | ICD-10-CM | POA: Insufficient documentation

## 2012-12-19 DIAGNOSIS — Z79899 Other long term (current) drug therapy: Secondary | ICD-10-CM | POA: Insufficient documentation

## 2012-12-19 DIAGNOSIS — M549 Dorsalgia, unspecified: Secondary | ICD-10-CM | POA: Insufficient documentation

## 2012-12-19 DIAGNOSIS — M25519 Pain in unspecified shoulder: Secondary | ICD-10-CM | POA: Insufficient documentation

## 2012-12-19 DIAGNOSIS — Z9089 Acquired absence of other organs: Secondary | ICD-10-CM | POA: Insufficient documentation

## 2012-12-19 DIAGNOSIS — I10 Essential (primary) hypertension: Secondary | ICD-10-CM | POA: Insufficient documentation

## 2012-12-19 DIAGNOSIS — F17200 Nicotine dependence, unspecified, uncomplicated: Secondary | ICD-10-CM | POA: Insufficient documentation

## 2012-12-19 DIAGNOSIS — R079 Chest pain, unspecified: Secondary | ICD-10-CM | POA: Insufficient documentation

## 2012-12-19 MED ORDER — OXYCODONE-ACETAMINOPHEN 5-325 MG PO TABS: 1.0000 | ORAL_TABLET | ORAL | Status: DC | PRN

## 2012-12-19 MED ORDER — HYDROCHLOROTHIAZIDE 25 MG PO TABS: 25.0000 mg | ORAL_TABLET | Freq: Every day | ORAL | Status: DC

## 2012-12-19 MED ORDER — OXYCODONE-ACETAMINOPHEN 5-325 MG PO TABS
1.0000 | ORAL_TABLET | Freq: Once | ORAL | Status: AC
  Administered 2012-12-19: 1 via ORAL
  Filled 2012-12-19: qty 1

## 2012-12-19 NOTE — ED Provider Notes (Signed)
History     CSN: [id number]  Arrival date & time 12/19/12  0714   First MD Initiated Contact with Patient 12/19/12 [phone]      Chief Complaint  Patient presents with  . Back Pain     HPI Patient ports proximal and one month of right scapular and right back pain radiating into her right shoulder.  She states the pain is been constant for once a month is worse with movement of her right arm and palpation of her right shoulder blade.  She doesn't know if she injured her shoulder.  Her pain is been constant.  She denies nausea vomiting diarrhea.  No shortness of breath.  Some of the pain radiates around to her right lower chest.  She is status post cholecystectomy.  No fevers or chills.  No rash.  No neck pain.  No other complaints.  Her symptoms are mild/moderate in severity.  Nothing improves her symptoms.  She's not have her primary care physician.  She also reports some difficulty with sleep at night and she also reports hypertension that is not currently receiving any medications.  She does not know what medication she is to be on.   Past Medical History  Diagnosis Date  . Obesity   . Hypertension     NO MED MD TO GIVE RX    Past Surgical History  Procedure Date  . Nose surgery     BROKEN   MANY YRS AGO   . Tubal ligation   . Cesarean section   . Carpal tunnel release 01/12/2012    Procedure: CARPAL TUNNEL RELEASE;  Surgeon: Marlowe Shores, MD;  Location: [facility] OR;  Service: Orthopedics;  Laterality: Right;  . Wrist fracture surgery Feb 2013    History reviewed. No pertinent family history.  History  Substance Use Topics  . Smoking status: Current Some Day Smoker  . Smokeless tobacco: Not on file  . Alcohol Use: 0.6 oz/week    1 Glasses of wine per week     Comment: DAILY ALCOHOL     OB History    Grav Para Term Preterm Abortions TAB SAB Ect Mult Living                  Review of Systems  All other systems reviewed and are negative.    Allergies   Hydrocodone  Home Medications   Current Outpatient Rx  Name  Route  Sig  Dispense  Refill  . IBUPROFEN 200 MG PO TABS   Oral   Take 800 mg by mouth every 6 (six) hours as needed. For pain         . HYDROCHLOROTHIAZIDE 25 MG PO TABS   Oral   Take 1 tablet (25 mg total) by mouth daily.   30 tablet   1   . OXYCODONE-ACETAMINOPHEN 5-325 MG PO TABS   Oral   Take 1 tablet by mouth every 4 (four) hours as needed for pain.   20 tablet   0     BP 176/110  Pulse 93  SpO2 100%  LMP 11/13/2011  Physical Exam  Nursing note and vitals reviewed. Constitutional: She is oriented to person, place, and time. She appears well-developed and well-nourished. No distress.  HENT:  Head: Normocephalic and atraumatic.  Eyes: EOM are normal.  Neck: Normal range of motion.       No midline cervical tenderness.   Cardiovascular: Normal rate, regular rhythm and normal heart sounds.   Pulmonary/Chest: Effort normal  and breath sounds normal.  Abdominal: Soft. She exhibits no distension. There is no tenderness.  Musculoskeletal: Normal range of motion.       Tenderness to right medial scapular muscles.  Tenderness of her right trapezius muscle with mild spasm.  Pain with range of motion of her right shoulder which exacerbates the pain in her right scapula.  Normal right radial pulse.  Normal grip strength on the right.  No rash noted on her back.  Neurological: She is alert and oriented to person, place, and time.       5 out of 5 strength in bilateral upper lower extremities  Skin: Skin is warm and dry.  Psychiatric: She has a normal mood and affect. Judgment normal.    ED Course  Procedures (including critical care time)  Labs Reviewed - No data to display No results found.   1. Back pain   2. Shoulder pain   3. HTN (hypertension)       MDM  Her pain seems musculoskeletal in nature.  Pain treated.  Recommended develop a relationship with her primary care physician.  The patient is  hypertensive in the emergency department at 176/110.  I do not think this is related to any of her symptoms today.  She restarted on hydrochlorothiazide and instructed to followup with her primary care Dr. for recheck of her blood pressure and monitoring of additional or changes in hypertensive medications.  Outpatient resources for local primary care physician's an indigent care provided        Lyanne Co, MD 12/19/12 [phone]

## 2012-12-19 NOTE — ED Notes (Signed)
Pt has multiple complaints, c/o back and right shoulder pain, knee pain and right thoracic pain under breast. Pt also states she gets weak and sob on occasion. Pt denies CP or trauma.

## 2013-03-06 ENCOUNTER — Emergency Department (INDEPENDENT_AMBULATORY_CARE_PROVIDER_SITE_OTHER): Payer: Self-pay

## 2013-03-06 ENCOUNTER — Encounter (HOSPITAL_COMMUNITY): Payer: Self-pay | Admitting: Emergency Medicine

## 2013-03-06 ENCOUNTER — Telehealth (HOSPITAL_COMMUNITY): Payer: Self-pay | Admitting: *Deleted

## 2013-03-06 ENCOUNTER — Emergency Department (INDEPENDENT_AMBULATORY_CARE_PROVIDER_SITE_OTHER)
Admission: EM | Admit: 2013-03-06 | Discharge: 2013-03-06 | Disposition: A | Discharge status: Home / Self Care | Payer: Self-pay | Source: Home / Self Care | Attending: Family Medicine | Admitting: Family Medicine

## 2013-03-06 DIAGNOSIS — M722 Plantar fascial fibromatosis: Secondary | ICD-10-CM

## 2013-03-06 MED ORDER — DICLOFENAC POTASSIUM 50 MG PO TABS: 50.0000 mg | ORAL_TABLET | Freq: Three times a day (TID) | ORAL | Status: DC

## 2013-03-06 NOTE — ED Notes (Signed)
Reports: left heal pain and swelling. Pain is severe with walking and putting on shoes  Denies injury. Pt also c/o right wrist and forearm pain. Pain is a tingling/numbing sensation and has weakness in hand.  Pt has a hx of right broken wrist.   Pt has taken otc meds with no relief.

## 2013-03-06 NOTE — ED Notes (Signed)
Pt. called on VM.  I called her back she said she was seen today and needs a work note for Sunday.  I told her there weren't any providers here now.  Suggested she come her tomorrow so they can ask one of the doctors and print it out for her. Pt. is supposed to call Dr. Luiz Blare on Monday for f/u. Kathleen [name] 03/06/2013

## 2013-03-06 NOTE — ED Provider Notes (Signed)
History     CSN: [id number]  Arrival date & time 03/06/13  1429   First MD Initiated Contact with Patient 03/06/13 1727      Chief Complaint  Patient presents with  . Foot Pain    left foot /heal pain.   Marland Kitchen Extremity Weakness    right forearm and wrist pain with weakness    (Consider location/radiation/quality/duration/timing/severity/associated sxs/prior treatment) Patient is a 52 y.o. female presenting with lower extremity pain. The history is provided by the patient.  Foot Pain This is a chronic problem. The current episode started more than 1 week ago (onset 1 yr ago, worse past month, can't afford to be off work, stands all day.). The problem has been gradually worsening. The symptoms are aggravated by walking.    Past Medical History  Diagnosis Date  . Obesity   . Hypertension     NO MED MD TO GIVE RX    Past Surgical History  Procedure Laterality Date  . Nose surgery      BROKEN   MANY YRS AGO   . Tubal ligation    . Cesarean section    . Carpal tunnel release  01/12/2012    Procedure: CARPAL TUNNEL RELEASE;  Surgeon: Marlowe Shores, MD;  Location: [facility] OR;  Service: Orthopedics;  Laterality: Right;  . Wrist fracture surgery  Feb 2013    History reviewed. No pertinent family history.  History  Substance Use Topics  . Smoking status: Current Some Day Smoker  . Smokeless tobacco: Not on file  . Alcohol Use: 0.6 oz/week    1 Glasses of wine per week     Comment: DAILY ALCOHOL     OB History   Grav Para Term Preterm Abortions TAB SAB Ect Mult Living                  Review of Systems  Constitutional: Negative.   Musculoskeletal: Positive for arthralgias and gait problem. Negative for joint swelling.  Skin: Negative.     Allergies  Hydrocodone  Home Medications   Current Outpatient Rx  Name  Route  Sig  Dispense  Refill  . diclofenac (CATAFLAM) 50 MG tablet   Oral   Take 1 tablet (50 mg total) by mouth 3 (three) times daily. For foot pain   30  tablet   0   . hydrochlorothiazide (HYDRODIURIL) 25 MG tablet   Oral   Take 1 tablet (25 mg total) by mouth daily.   30 tablet   1   . ibuprofen (ADVIL,MOTRIN) 200 MG tablet   Oral   Take 800 mg by mouth every 6 (six) hours as needed. For pain         . oxyCODONE-acetaminophen (PERCOCET/ROXICET) 5-325 MG per tablet   Oral   Take 1 tablet by mouth every 4 (four) hours as needed for pain.   20 tablet   0     Pulse 94  Temp(Src) 98.4 F (36.9 C) (Oral)  Resp 18  SpO2 100%  LMP 11/13/2011  Physical Exam  Nursing note and vitals reviewed. Constitutional: She is oriented to person, place, and time. She appears well-developed and well-nourished.  Musculoskeletal: She exhibits tenderness.       Feet:  Neurological: She is alert and oriented to person, place, and time.  Skin: Skin is warm and dry.    ED Course  Procedures (including critical care time)  Labs Reviewed - No data to display Dg Os Calcis Left  03/06/2013  *  RADIOLOGY REPORT*  Clinical Data: Left heel pain and swelling.  LEFT OS CALCIS - 2+ VIEW  Comparison: None.  Findings: Posterior and plantar calcaneal spurring is present.  No fracture, dislocation, or bony destruction is seen.  There is a slightly osteopenic appearance of the bone.  IMPRESSION: Calcaneal spurring.   Original Report Authenticated By: Onalee Hua Call      1. Plantar fasciitis of left foot       MDM  X-rays reviewed and report per radiologist.         Linna Hoff, MD 03/06/13 2003

## 2013-03-06 NOTE — ED Notes (Signed)
Waiting discharge papers 

## 2013-03-11 ENCOUNTER — Emergency Department (INDEPENDENT_AMBULATORY_CARE_PROVIDER_SITE_OTHER)
Admission: EM | Admit: 2013-03-11 | Discharge: 2013-03-11 | Disposition: A | Discharge status: Home / Self Care | Payer: Self-pay | Source: Home / Self Care

## 2013-03-11 ENCOUNTER — Encounter (HOSPITAL_COMMUNITY): Payer: Self-pay

## 2013-03-11 DIAGNOSIS — M79609 Pain in unspecified limb: Secondary | ICD-10-CM

## 2013-03-11 DIAGNOSIS — I10 Essential (primary) hypertension: Secondary | ICD-10-CM

## 2013-03-11 DIAGNOSIS — M79672 Pain in left foot: Secondary | ICD-10-CM

## 2013-03-11 LAB — LIPID PANEL
HDL: 64 mg/dL (ref >39)
Total CHOL/HDL Ratio: 2.7 RATIO
VLDL: 20 mg/dL (ref 0–40)

## 2013-03-11 LAB — BASIC METABOLIC PANEL
BUN: 13 mg/dL (ref 6–23)
CO2: 29 mEq/L (ref 19–32)
Chloride: 104 mEq/L (ref 96–112)
Creatinine, Ser: 0.71 mg/dL (ref 0.50–1.10)
Potassium: 3.5 mEq/L (ref 3.5–5.1)

## 2013-03-11 MED ORDER — LISINOPRIL 20 MG PO TABS: 20.0000 mg | ORAL_TABLET | Freq: Every day | ORAL | Status: DC

## 2013-03-11 MED ORDER — OXYCODONE-ACETAMINOPHEN 5-325 MG PO TABS: 1.0000 | ORAL_TABLET | ORAL | Status: DC | PRN

## 2013-03-11 NOTE — ED Notes (Signed)
Follow up HTN 

## 2013-03-11 NOTE — ED Provider Notes (Signed)
History     CSN: [id number]  Arrival date & time 03/11/13  1131   First MD Initiated Contact with Patient 03/11/13 1151      Chief Complaint  Patient presents with  . Follow-up    (Consider location/radiation/quality/duration/timing/severity/associated sxs/prior treatment) HPI Pt is 52 yo female who presents to clinic for evaluation of high BP. She was in the orthopedic office this AM and the ortho doctor was going to give her prednisone for left foot plantar fasciitis and due to very high BP she was referred to PCP to have that evaluation. Pt reports taking HCTZ regularly, denies checking BP at home so she is not sure how well is control. Pt denies chest pain or shortness of breath this AM, no specific abdominal or urinary concerns.  Past Medical History  Diagnosis Date  . Obesity   . Hypertension     NO MED MD TO GIVE RX    Past Surgical History  Procedure Laterality Date  . Nose surgery      BROKEN   MANY YRS AGO   . Tubal ligation    . Cesarean section    . Carpal tunnel release  01/12/2012    Procedure: CARPAL TUNNEL RELEASE;  Surgeon: Marlowe Shores, MD;  Location: [facility] OR;  Service: Orthopedics;  Laterality: Right;  . Wrist fracture surgery  Feb 2013    Family history of HTN  History  Substance Use Topics  . Smoking status: Current Some Day Smoker  . Smokeless tobacco: Not on file  . Alcohol Use: 0.6 oz/week    1 Glasses of wine per week     Comment: DAILY ALCOHOL     OB History   Grav Para Term Preterm Abortions TAB SAB Ect Mult Living                  Review of Systems Review of Systems  Constitutional: Negative for fever, chills, diaphoresis, activity change, appetite change and fatigue.  HENT: Negative for ear pain, nosebleeds, congestion, facial swelling, rhinorrhea, neck pain, neck stiffness and ear discharge.   Eyes: Negative for pain, discharge, redness, itching and visual disturbance.  Respiratory: Negative for cough, choking, chest tightness,  shortness of breath, wheezing and stridor.   Cardiovascular: Negative for chest pain, palpitations and leg swelling.  Gastrointestinal: Negative for abdominal distention.  Genitourinary: Negative for dysuria, urgency, frequency, hematuria, flank pain, decreased urine volume, difficulty urinating and dyspareunia.  Musculoskeletal: Negative for back pain, joint swelling, arthralgias. Positive for left foot pain Neurological: Negative for dizziness, tremors, seizures, syncope, facial asymmetry, speech difficulty, weakness, light-headedness, numbness and headaches.  Hematological: Negative for adenopathy. Does not bruise/bleed easily.  Psychiatric/Behavioral: Negative for hallucinations, behavioral problems, confusion, dysphoric mood, decreased concentration and agitation.    Allergies  Hydrocodone  Home Medications   Current Outpatient Rx  Name  Route  Sig  Dispense  Refill  . diclofenac (CATAFLAM) 50 MG tablet   Oral   Take 1 tablet (50 mg total) by mouth 3 (three) times daily. For foot pain   30 tablet   0   . hydrochlorothiazide (HYDRODIURIL) 25 MG tablet   Oral   Take 1 tablet (25 mg total) by mouth daily.   30 tablet   1   . ibuprofen (ADVIL,MOTRIN) 200 MG tablet   Oral   Take 800 mg by mouth every 6 (six) hours as needed. For pain         . lisinopril (PRINIVIL,ZESTRIL) 20 MG tablet   Oral  Take 1 tablet (20 mg total) by mouth daily.   30 tablet   3   . oxyCODONE-acetaminophen (PERCOCET/ROXICET) 5-325 MG per tablet   Oral   Take 1 tablet by mouth every 4 (four) hours as needed for pain.   65 tablet   0     BP 164/107  Pulse 76  Temp(Src) 98.1 F (36.7 C) (Oral)  Resp 19  SpO2 97%  LMP 11/13/2011  Physical Exam Physical Exam  Constitutional: Appears well-developed and well-nourished. No distress.  HENT: Normocephalic. External right and left ear normal. Oropharynx is clear and moist.  Eyes: Conjunctivae and EOM are normal. PERRLA, no scleral icterus.   Neck: Normal ROM. Neck supple. No JVD. No tracheal deviation. No thyromegaly.  CVS: RRR, S1/S2 +, no murmurs, no gallops, no carotid bruit.  Pulmonary: Effort and breath sounds normal, no stridor, rhonchi, wheezes, rales.  Abdominal: Soft. BS +,  no distension, tenderness, rebound or guarding.  Musculoskeletal: Normal range of motion. No edema and no tenderness.  Lymphadenopathy: No lymphadenopathy noted, cervical, inguinal. Neuro: Alert. Normal reflexes, muscle tone coordination. No cranial nerve deficit. Skin: Skin is warm and dry. No rash noted. Not diaphoretic. No erythema. No pallor.  Psychiatric: Normal mood and affect. Behavior, judgment, thought content normal.    ED Course  Procedures (including critical care time)  Labs Reviewed  BASIC METABOLIC PANEL  LIPID PANEL   No results found.   1. Left foot pain - recently diagnosed with plantar fasciitis, has been seen by orhto this AM, will continue to follow up on recommendations   2. HTN (hypertension) - BP at the orthopedic doctor apparently > 220/ 120. Repeat BP here in the office much better. I will start pt on Lisinopril and will obtain BMP today to ensure that electrolytes and kidney function is stable. I have advised pt to come back in AM to have BP rechecked and we have also discussed the importance of medical compliance and regular BP checks       MDM  Uncontrolled HTN, add Lisinopril to medical regimen         Dorothea Ogle, MD 03/11/13 1209

## 2013-03-12 ENCOUNTER — Emergency Department (HOSPITAL_COMMUNITY)
Admission: EM | Admit: 2013-03-12 | Discharge: 2013-03-12 | Disposition: A | Discharge status: Home / Self Care | Payer: Self-pay | Source: Home / Self Care

## 2013-03-12 NOTE — ED Notes (Signed)
Patient here for blood pressure check

## 2013-03-26 ENCOUNTER — Ambulatory Visit: Payer: Self-pay | Attending: Family Medicine | Admitting: Physical Therapy

## 2013-03-26 DIAGNOSIS — IMO0001 Reserved for inherently not codable concepts without codable children: Secondary | ICD-10-CM | POA: Insufficient documentation

## 2013-03-26 DIAGNOSIS — R269 Unspecified abnormalities of gait and mobility: Secondary | ICD-10-CM | POA: Insufficient documentation

## 2013-03-26 DIAGNOSIS — R262 Difficulty in walking, not elsewhere classified: Secondary | ICD-10-CM | POA: Insufficient documentation

## 2013-03-26 DIAGNOSIS — M25579 Pain in unspecified ankle and joints of unspecified foot: Secondary | ICD-10-CM | POA: Insufficient documentation

## 2013-04-01 ENCOUNTER — Ambulatory Visit: Payer: Self-pay | Admitting: Physical Therapy

## 2013-04-07 ENCOUNTER — Ambulatory Visit: Payer: No Typology Code available for payment source | Attending: Family Medicine | Admitting: Physical Therapy

## 2013-04-07 DIAGNOSIS — M25579 Pain in unspecified ankle and joints of unspecified foot: Secondary | ICD-10-CM | POA: Insufficient documentation

## 2013-04-07 DIAGNOSIS — IMO0001 Reserved for inherently not codable concepts without codable children: Secondary | ICD-10-CM | POA: Insufficient documentation

## 2013-04-07 DIAGNOSIS — R269 Unspecified abnormalities of gait and mobility: Secondary | ICD-10-CM | POA: Insufficient documentation

## 2013-04-07 DIAGNOSIS — R262 Difficulty in walking, not elsewhere classified: Secondary | ICD-10-CM | POA: Insufficient documentation

## 2013-04-08 ENCOUNTER — Emergency Department (HOSPITAL_COMMUNITY)
Admission: EM | Admit: 2013-04-08 | Discharge: 2013-04-08 | Disposition: A | Discharge status: Home / Self Care | Payer: No Typology Code available for payment source | Source: Home / Self Care

## 2013-04-08 ENCOUNTER — Encounter (HOSPITAL_COMMUNITY): Payer: Self-pay

## 2013-04-08 DIAGNOSIS — G4700 Insomnia, unspecified: Secondary | ICD-10-CM

## 2013-04-08 DIAGNOSIS — F329 Major depressive disorder, single episode, unspecified: Secondary | ICD-10-CM

## 2013-04-08 DIAGNOSIS — F32A Depression, unspecified: Secondary | ICD-10-CM

## 2013-04-08 DIAGNOSIS — M5416 Radiculopathy, lumbar region: Secondary | ICD-10-CM

## 2013-04-08 DIAGNOSIS — I10 Essential (primary) hypertension: Secondary | ICD-10-CM

## 2013-04-08 MED ORDER — CYCLOBENZAPRINE HCL 5 MG PO TABS: 5.0000 mg | ORAL_TABLET | Freq: Two times a day (BID) | ORAL | Status: DC | PRN

## 2013-04-08 MED ORDER — ESCITALOPRAM OXALATE 10 MG PO TABS: 10.0000 mg | ORAL_TABLET | Freq: Every day | ORAL | Status: DC

## 2013-04-08 MED ORDER — TRAZODONE HCL 50 MG PO TABS: 50.0000 mg | ORAL_TABLET | Freq: Every day | ORAL | Status: DC

## 2013-04-08 NOTE — ED Provider Notes (Signed)
History     CSN: [id number]  Arrival date & time 04/08/13  7746  52 year old morbidly obese female with a history of hypertension, osteoarthritis who presents to [facility] for evaluation of her blood pressure. The patient states that she has been taking ibuprofen 3 tablets on a daily basis and that does not help her with the pain. She works at [employer] and has to stand on her feet for the most part. The patient is tearful and is requesting something for depression. She also states that she has a hard time falling asleep and stays up at least 2-3 hours in the night if she wakes up at night. She denies any chest pain any shortness of breath any orthopnea paroxysmal nocturnal dyspnea. She has dependent swelling in her left lower extremity and is awaiting a steroid injection to her left ankle    Chief Complaint  Patient presents with  . Follow-up    (Consider location/radiation/quality/duration/timing/severity/associated sxs/prior treatment) HPI  Past Medical History  Diagnosis Date  . Obesity   . Hypertension     NO MED MD TO GIVE RX    Past Surgical History  Procedure Laterality Date  . Nose surgery      BROKEN   MANY YRS AGO   . Tubal ligation    . Cesarean section    . Carpal tunnel release  01/12/2012    Procedure: CARPAL TUNNEL RELEASE;  Surgeon: Marlowe Shores, MD;  Location: [facility] OR;  Service: Orthopedics;  Laterality: Right;  . Wrist fracture surgery  Feb 2013    No family history on file.  History  Substance Use Topics  . Smoking status: Current Some Day Smoker  . Smokeless tobacco: Not on file  . Alcohol Use: 0.6 oz/week    1 Glasses of wine per week     Comment: DAILY ALCOHOL     OB History   Grav Para Term Preterm Abortions TAB SAB Ect Mult Living                  Review of Systems As in history of present illness Allergies  Hydrocodone  Home Medications   Current Outpatient Rx  Name  Route  Sig  Dispense  Refill  . cyclobenzaprine (FLEXERIL) 5  MG tablet   Oral   Take 1 tablet (5 mg total) by mouth 2 (two) times daily as needed for muscle spasms.   30 tablet   0   . diclofenac (CATAFLAM) 50 MG tablet   Oral   Take 1 tablet (50 mg total) by mouth 3 (three) times daily. For foot pain   30 tablet   0   . escitalopram (LEXAPRO) 10 MG tablet   Oral   Take 1 tablet (10 mg total) by mouth daily.   30 tablet   0   . hydrochlorothiazide (HYDRODIURIL) 25 MG tablet   Oral   Take 1 tablet (25 mg total) by mouth daily.   30 tablet   1   . ibuprofen (ADVIL,MOTRIN) 200 MG tablet   Oral   Take 800 mg by mouth every 6 (six) hours as needed. For pain         . lisinopril (PRINIVIL,ZESTRIL) 20 MG tablet   Oral   Take 1 tablet (20 mg total) by mouth daily.   30 tablet   3   . oxyCODONE-acetaminophen (PERCOCET/ROXICET) 5-325 MG per tablet   Oral   Take 1 tablet by mouth every 4 (four) hours as needed for pain.  65 tablet   0   . traZODone (DESYREL) 50 MG tablet   Oral   Take 1 tablet (50 mg total) by mouth at bedtime.   30 tablet   0     BP 151/107  Pulse 84  Temp(Src) 98 F (36.7 C) (Oral)  Resp 16  SpO2 98%  LMP 11/13/2011  Physical Exam CVS: RRR, S1/S2 +, no murmurs, no gallops, no carotid bruit.  Pulmonary: Effort and breath sounds normal, no stridor, rhonchi, wheezes, rales.  Abdominal: Soft. BS +, no distension, tenderness, rebound or guarding.  Musculoskeletal: Normal range of motion. No edema and no tenderness.  Lymphadenopathy: No lymphadenopathy noted, cervical, inguinal. Neuro: Alert. Normal reflexes, muscle tone coordination. No cranial nerve deficit.  Skin: Skin is warm and dry. No rash noted. Not diaphoretic. No erythema. No pallor.  Psychiatric: Normal mood and affect. Behavior, judgment, thought content normal.   ED Course  Procedures (including critical care time)  Labs Reviewed - No data to display No results found.   1. Hypertension continue with the current regimen. Blood pressure  exacerbated by the fact that the patient is taking NSAIDs.    2. Depression start the patient on Lexapro 10 mg a day   3. Lumbar radiculopathy, chronic started the patient on Flexeril 5 mg as needed twice a day Prescription for Percocet provided as the patient had a recent refill on 03/11/13 for 65 tablets,   4. Insomnia prescribed the patient trazodone     patient explained about the drug drug interactions and advised to take sleeping pill around 9 PM to get a full night sleep before she starts driving to work   MDM           Richarda Overlie, MD 04/08/13 1750

## 2013-04-08 NOTE — ED Notes (Signed)
Follow up-back pain and hypertension

## 2013-04-09 ENCOUNTER — Ambulatory Visit: Payer: No Typology Code available for payment source | Admitting: Physical Therapy

## 2013-06-02 ENCOUNTER — Ambulatory Visit: Payer: No Typology Code available for payment source

## 2013-10-14 ENCOUNTER — Ambulatory Visit: Payer: Self-pay | Attending: Internal Medicine | Admitting: Internal Medicine

## 2013-10-14 VITALS — BP 134/93 | HR 89 | Temp 98.7°F | Resp 16 | Ht 66.0 in | Wt 326.0 lb

## 2013-10-14 DIAGNOSIS — E669 Obesity, unspecified: Secondary | ICD-10-CM | POA: Insufficient documentation

## 2013-10-14 DIAGNOSIS — I10 Essential (primary) hypertension: Secondary | ICD-10-CM | POA: Insufficient documentation

## 2013-10-14 DIAGNOSIS — M129 Arthropathy, unspecified: Secondary | ICD-10-CM

## 2013-10-14 DIAGNOSIS — M1990 Unspecified osteoarthritis, unspecified site: Secondary | ICD-10-CM

## 2013-10-14 DIAGNOSIS — G4700 Insomnia, unspecified: Secondary | ICD-10-CM

## 2013-10-14 MED ORDER — LISINOPRIL-HYDROCHLOROTHIAZIDE 20-12.5 MG PO TABS: 1.0000 | ORAL_TABLET | Freq: Every day | ORAL | Status: DC

## 2013-10-14 MED ORDER — CYCLOBENZAPRINE HCL 5 MG PO TABS: 5.0000 mg | ORAL_TABLET | Freq: Two times a day (BID) | ORAL | Status: DC | PRN

## 2013-10-14 MED ORDER — TRAZODONE HCL 100 MG PO TABS: 50.0000 mg | ORAL_TABLET | Freq: Every day | ORAL | Status: DC

## 2013-10-14 NOTE — Progress Notes (Signed)
Patient Demographics  Kathleen [name], is a 52 y.o. female  RUE:[id number]  JYN:[id number]  DOB - [birth year]  Chief Complaint  Patient presents with  . Establish Care        Subjective:   Kathleen [name] today is here for a follow up visit. Patient is a 52 year old African American female who has a history of obesity, hypertension, severe osteoarthritis of her bilateral knees presenting for a followup visit. She has been noncompliant with antihypertensive medications, only intermittently takes them. She complains of pain in her right forearm area, she claims that a few months ago she had wrist fracture and that was repaired. For approximately a month she has had pain in the right upper extremity-mostly in the right forearm area. There is no swelling or erythema in the forearm. She also has ongoing pain in her bilateral knees, follows up with a primary orthopedist-? [facility]. She has been told she probably will need knee replacement surgery, and periodically gets intra-articular steroids from her orthopedist.  Patient claims that she does not like doctors and does not feel like she needs to go on unless her symptoms worsen. I've tried to make her understand that she needs to go see her regular doctors on a regular basis so that we can intervene early. I have asked her to go and make an appointment with her primary orthopedic surgeon as soon as possible.  Patient has No headache, No chest pain, No abdominal pain - No Nausea, No new weakness tingling or numbness, No Cough - SOB.  Objective:    Filed Vitals:   10/14/13 1534  BP: 134/93  Pulse: 89  Temp: 98.7 F (37.1 C)  TempSrc: Oral  Resp: 16  Height: 5\' 6"  (1.676 m)  Weight: 326 lb (147.873 kg)  SpO2: 90%     ALLERGIES:   Allergies  Allergen Reactions  . Hydrocodone Itching    PAST MEDICAL HISTORY: Past Medical History  Diagnosis Date  . Obesity   . Hypertension     NO MED MD TO GIVE RX    MEDICATIONS AT  HOME: Prior to Admission medications   Medication Sig Start Date End Date Taking? Authorizing Provider  cyclobenzaprine (FLEXERIL) 5 MG tablet Take 1 tablet (5 mg total) by mouth 2 (two) times daily as needed for muscle spasms. 10/14/13  Yes Shanker Levora Dredge, MD  diclofenac (CATAFLAM) 50 MG tablet Take 1 tablet (50 mg total) by mouth 3 (three) times daily. For foot pain 03/06/13   Linna Hoff, MD  ibuprofen (ADVIL,MOTRIN) 200 MG tablet Take 800 mg by mouth every 6 (six) hours as needed. For pain    Historical Provider, MD  lisinopril-hydrochlorothiazide (ZESTORETIC) 20-12.5 MG per tablet Take 1 tablet by mouth daily. 10/14/13   Shanker Levora Dredge, MD  traZODone (DESYREL) 100 MG tablet Take 0.5 tablets (50 mg total) by mouth at bedtime. 10/14/13   Shanker Levora Dredge, MD     Exam  General appearance :Awake, alert, not in any distress. Speech Clear. Not toxic Looking HEENT: Atraumatic and Normocephalic, pupils equally reactive to light and accomodation Neck: supple, no JVD. No cervical lymphadenopathy.  Chest:Good air entry bilaterally, no added sounds  CVS: S1 S2 regular, no murmurs.  Abdomen: Bowel sounds present, Non tender and not distended with no gaurding, rigidity or rebound. Extremities: B/L Lower Ext shows no edema, both legs are warm to touch Neurology: Awake alert, and oriented X 3, CN II-XII intact, Non focal Skin:No Rash Wounds:N/A    Data Review  CBC No results found for this basename: WBC, HGB, HCT, PLT, MCV, MCH, MCHC, RDW, NEUTRABS, LYMPHSABS, MONOABS, EOSABS, BASOSABS, BANDABS, BANDSABD,  in the last 168 hours  Chemistries   No results found for this basename: NA, K, CL, CO2, GLUCOSE, BUN, CREATININE, GFRCGP, CALCIUM, MG, AST, ALT, ALKPHOS, BILITOT,  in the last 168 hours ------------------------------------------------------------------------------------------------------------------ No results found for this basename: HGBA1C,  in the last 72  hours ------------------------------------------------------------------------------------------------------------------ No results found for this basename: CHOL, HDL, LDLCALC, TRIG, CHOLHDL, LDLDIRECT,  in the last 72 hours ------------------------------------------------------------------------------------------------------------------ No results found for this basename: TSH, T4TOTAL, FREET3, T3FREE, THYROIDAB,  in the last 72 hours ------------------------------------------------------------------------------------------------------------------ No results found for this basename: VITAMINB12, FOLATE, FERRITIN, TIBC, IRON, RETICCTPCT,  in the last 72 hours  Coagulation profile  No results found for this basename: INR, PROTIME,  in the last 168 hours    Assessment & Plan  Hypertension - Refill lisinopril/HCTZ-counseled regarding compliance to medication and followup  Obesity - Counseled regarding weight loss  Right forearm pain/bilateral knee pain - I have asked the patient to follow up with her primary orthopedic surgeon-patient claims that she will make an appointment as soon as possible. In the meantime patient is to use Tylenol or NSAIDs for pain.  Insomnia - Will refill trazodone  ? Depression - Referred to psychiatry  Health Maintenance -Colonoscopy: Will refer to GI -Pap Smear: Will refer to GYN -Mammogram: Will order -Vaccinations:  -Influenza- will ask our and to administer today  Follow up in one month  The patient was given clear instructions to go to ER or return to medical center if symptoms don't improve, worsen or new problems develop. The patient verbalized understanding. The patient was told to call to get lab results if they haven't heard anything in the next week.

## 2013-10-14 NOTE — Progress Notes (Signed)
Pt is here to establish care. Pt had surgery on her right arm that is now causing her a lot of pain. She is also having B/L knee pain.

## 2013-10-16 ENCOUNTER — Ambulatory Visit: Payer: Self-pay

## 2013-10-27 ENCOUNTER — Other Ambulatory Visit: Payer: Self-pay | Admitting: Internal Medicine

## 2013-10-27 ENCOUNTER — Encounter (HOSPITAL_COMMUNITY): Payer: Self-pay | Admitting: Emergency Medicine

## 2013-10-27 ENCOUNTER — Emergency Department (HOSPITAL_COMMUNITY)
Admission: EM | Admit: 2013-10-27 | Discharge: 2013-10-27 | Disposition: A | Discharge status: Home / Self Care | Payer: Self-pay | Attending: Emergency Medicine | Admitting: Emergency Medicine

## 2013-10-27 DIAGNOSIS — W1809XA Striking against other object with subsequent fall, initial encounter: Secondary | ICD-10-CM | POA: Insufficient documentation

## 2013-10-27 DIAGNOSIS — E669 Obesity, unspecified: Secondary | ICD-10-CM | POA: Insufficient documentation

## 2013-10-27 DIAGNOSIS — Y9389 Activity, other specified: Secondary | ICD-10-CM | POA: Insufficient documentation

## 2013-10-27 DIAGNOSIS — I10 Essential (primary) hypertension: Secondary | ICD-10-CM | POA: Insufficient documentation

## 2013-10-27 DIAGNOSIS — M25569 Pain in unspecified knee: Secondary | ICD-10-CM | POA: Insufficient documentation

## 2013-10-27 DIAGNOSIS — Z79899 Other long term (current) drug therapy: Secondary | ICD-10-CM | POA: Insufficient documentation

## 2013-10-27 DIAGNOSIS — W19XXXA Unspecified fall, initial encounter: Secondary | ICD-10-CM

## 2013-10-27 DIAGNOSIS — Y9289 Other specified places as the place of occurrence of the external cause: Secondary | ICD-10-CM | POA: Insufficient documentation

## 2013-10-27 DIAGNOSIS — F17200 Nicotine dependence, unspecified, uncomplicated: Secondary | ICD-10-CM | POA: Insufficient documentation

## 2013-10-27 DIAGNOSIS — Z1231 Encounter for screening mammogram for malignant neoplasm of breast: Secondary | ICD-10-CM

## 2013-10-27 DIAGNOSIS — G8929 Other chronic pain: Secondary | ICD-10-CM | POA: Insufficient documentation

## 2013-10-27 DIAGNOSIS — S0990XA Unspecified injury of head, initial encounter: Secondary | ICD-10-CM | POA: Insufficient documentation

## 2013-10-27 MED ORDER — LORAZEPAM 1 MG PO TABS: 1.0000 mg | ORAL_TABLET | Freq: Three times a day (TID) | ORAL | Status: DC | PRN

## 2013-10-27 MED ORDER — ACETAMINOPHEN 325 MG PO TABS
650.0000 mg | ORAL_TABLET | Freq: Once | ORAL | Status: AC
  Administered 2013-10-27: 650 mg via ORAL
  Filled 2013-10-27: qty 2

## 2013-10-27 NOTE — ED Provider Notes (Signed)
CSN: [id number]     Arrival date & time 10/27/13  1157 History   First MD Initiated Contact with Patient 10/27/13 1206     Chief Complaint  Patient presents with  . Fall     HPI  Patient presents today as after a fall with pain persistently about the left posterior lateral head.  She states that she was sitting, fell backwards.  No loss of consciousness, either then or subsequently. No confusion, disorientation, visual changes, nausea, vomiting, incontinence, new weakness in any extremity. Patient has not taken any medication for relief of her symptoms thus far.    Past Medical History  Diagnosis Date  . Obesity   . Hypertension     NO MED MD TO GIVE RX   Past Surgical History  Procedure Laterality Date  . Nose surgery      BROKEN   MANY YRS AGO   . Tubal ligation    . Cesarean section    . Carpal tunnel release  01/12/2012    Procedure: CARPAL TUNNEL RELEASE;  Surgeon: Marlowe Shores, MD;  Location: [facility] OR;  Service: Orthopedics;  Laterality: Right;  . Wrist fracture surgery  Feb 2013   History reviewed. No pertinent family history. History  Substance Use Topics  . Smoking status: Current Some Day Smoker  . Smokeless tobacco: Not on file  . Alcohol Use: 0.6 oz/week    1 Glasses of wine per week     Comment: DAILY ALCOHOL    OB History   Grav Para Term Preterm Abortions TAB SAB Ect Mult Living                 Review of Systems  Constitutional:       Per HPI, otherwise negative  HENT:       Per HPI, otherwise negative  Respiratory:       Per HPI, otherwise negative  Cardiovascular:       Per HPI, otherwise negative  Gastrointestinal: Negative for nausea and vomiting.  Endocrine:       Negative aside from HPI  Genitourinary:       Neg aside from HPI   Musculoskeletal:       Chronic bilat knee pain and R UE pain, all unchanged  Skin: Negative.   Neurological: Positive for headaches. Negative for dizziness, tremors, seizures, syncope, facial asymmetry,  speech difficulty, weakness, light-headedness and numbness.    Allergies  Hydrocodone  Home Medications   Current Outpatient Rx  Name  Route  Sig  Dispense  Refill  . cyclobenzaprine (FLEXERIL) 5 MG tablet   Oral   Take 1 tablet (5 mg total) by mouth 2 (two) times daily as needed for muscle spasms.   30 tablet   0   . ibuprofen (ADVIL,MOTRIN) 200 MG tablet   Oral   Take 800 mg by mouth every 6 (six) hours as needed. For pain         . lisinopril-hydrochlorothiazide (ZESTORETIC) 20-12.5 MG per tablet   Oral   Take 1 tablet by mouth daily.   90 tablet   3   . traZODone (DESYREL) 100 MG tablet   Oral   Take 0.5 tablets (50 mg total) by mouth at bedtime.   30 tablet   0    BP 135/98  Pulse 107  Temp(Src) 98.2 F (36.8 C) (Oral)  Resp 18  Wt 320 lb 14.4 oz (145.559 kg)  SpO2 99%  LMP 11/13/2011 Physical Exam  Nursing note and vitals  reviewed. Constitutional: She is oriented to person, place, and time. She appears well-developed and well-nourished. No distress.  HENT:  Head: Normocephalic and atraumatic.    Right Ear: External ear normal.  Left Ear: External ear normal.  Nose: Nose normal.  Eyes: Conjunctivae and EOM are normal.  Neck: No spinous process tenderness present.    Cardiovascular: Normal rate and regular rhythm.   Pulmonary/Chest: Effort normal and breath sounds normal. No stridor. No respiratory distress.  Abdominal: She exhibits no distension.  Musculoskeletal: She exhibits no edema.  Neurological: She is alert and oriented to person, place, and time. No cranial nerve deficit.  Skin: Skin is warm and dry.  Psychiatric: She has a normal mood and affect.    ED Course  Procedures (including critical care time) Labs Review Labs Reviewed - No data to display Imaging Review No results found.  EKG Interpretation   None       MDM   1. Fall, initial encounter    This patient presents with after a fall with ongoing pain.  On exam she is  awake and alert, neurologically intact and in no distress.  Patient does have elevated blood pressure, though no evidence for end organ effects.  She has a primary care physician with whom she will followup, as she was advised to do in this regard.  With no distress, no neurologic compromise, and given the passage of this since the fall, there is no indication for imaging.  Patient was provided new analgesics, with relaxants given the description of a tense pain with muscle tightness.  She discharged in stable condition to    Gerhard Munch, MD 10/27/13 1228

## 2013-10-27 NOTE — ED Notes (Signed)
Pt states she was sitting backwards on Sunday and hit the back of her head and her heads been hurting since. States she has "felt woozy" but her vision is normal and denies LOC. Pt states she also wants to have her R arm checked out because shes been having weakness in the R arm "for a while." A&Ox4, ambulatory, mae

## 2013-10-31 ENCOUNTER — Encounter (HOSPITAL_COMMUNITY): Payer: Self-pay | Admitting: Emergency Medicine

## 2013-10-31 ENCOUNTER — Emergency Department (HOSPITAL_COMMUNITY)
Admission: EM | Admit: 2013-10-31 | Discharge: 2013-10-31 | Disposition: A | Discharge status: Home / Self Care | Payer: Self-pay | Attending: Emergency Medicine | Admitting: Emergency Medicine

## 2013-10-31 ENCOUNTER — Emergency Department (HOSPITAL_COMMUNITY): Payer: Self-pay

## 2013-10-31 DIAGNOSIS — E669 Obesity, unspecified: Secondary | ICD-10-CM | POA: Insufficient documentation

## 2013-10-31 DIAGNOSIS — K0889 Other specified disorders of teeth and supporting structures: Secondary | ICD-10-CM

## 2013-10-31 DIAGNOSIS — Z79899 Other long term (current) drug therapy: Secondary | ICD-10-CM | POA: Insufficient documentation

## 2013-10-31 DIAGNOSIS — R519 Headache, unspecified: Secondary | ICD-10-CM

## 2013-10-31 DIAGNOSIS — J329 Chronic sinusitis, unspecified: Secondary | ICD-10-CM | POA: Insufficient documentation

## 2013-10-31 DIAGNOSIS — R51 Headache: Secondary | ICD-10-CM | POA: Insufficient documentation

## 2013-10-31 DIAGNOSIS — K089 Disorder of teeth and supporting structures, unspecified: Secondary | ICD-10-CM | POA: Insufficient documentation

## 2013-10-31 DIAGNOSIS — I10 Essential (primary) hypertension: Secondary | ICD-10-CM | POA: Insufficient documentation

## 2013-10-31 DIAGNOSIS — F17200 Nicotine dependence, unspecified, uncomplicated: Secondary | ICD-10-CM | POA: Insufficient documentation

## 2013-10-31 MED ORDER — OXYCODONE-ACETAMINOPHEN 5-325 MG PO TABS: 1.0000 | ORAL_TABLET | ORAL | Status: DC | PRN

## 2013-10-31 MED ORDER — AMOXICILLIN-POT CLAVULANATE 875-125 MG PO TABS: 1.0000 | ORAL_TABLET | Freq: Two times a day (BID) | ORAL | Status: DC

## 2013-10-31 MED ORDER — OXYCODONE-ACETAMINOPHEN 5-325 MG PO TABS
1.0000 | ORAL_TABLET | Freq: Once | ORAL | Status: AC
  Administered 2013-10-31: 1 via ORAL
  Filled 2013-10-31: qty 1

## 2013-10-31 MED ORDER — PENICILLIN V POTASSIUM 250 MG PO TABS
500.0000 mg | ORAL_TABLET | Freq: Once | ORAL | Status: AC
  Administered 2013-10-31: 500 mg via ORAL
  Filled 2013-10-31: qty 2

## 2013-10-31 NOTE — Discharge Instructions (Signed)
Take antibiotic as prescribed for both sinus and dental infection.  Take percocet as needed for severe pain.  Do not drive within four hours of taking this medication (may cause drowsiness or confusion).   Try over the counter coricidin for sinus congestion and post-nasal drip.  Follow up with your dentist and primary care doctor. Please return to the ER if your headache worsens or becomes associated with fever, vision changes, dizziness, vomiting, or any other concerning symptoms.  You should also return if you develop increased swelling of the left side of your mouth.

## 2013-10-31 NOTE — ED Notes (Addendum)
Facial swelling since Wednesday. Location: left side. States, "upper jaw hurts, making it difficult to eat."  and h/a that she cannot get rid of. Fall at work last Sunday.  Don't know if her head is hurting from that - here on 10/27/13. Similar complaints. Prescribed lorazepam from prev. Visit for back pain.

## 2013-10-31 NOTE — ED Provider Notes (Signed)
CSN: [id number]     Arrival date & time 10/31/13  1928 History   First MD Initiated Contact with Patient 10/31/13 2018     Chief Complaint  Patient presents with  . Facial Swelling  . Headache   (Consider location/radiation/quality/duration/timing/severity/associated sxs/prior Treatment) HPI History provided by pt.   Pt presents w/ two complaints.  Had a fall 6 days ago and may or may not have hit her head, but has had a persistent headache that waxes and wanes but is currently severe, ever since.  Located on top of head and bilateral temples.  Associated w/ "wooziness".  Denies fever, vision changes, photo/phonophobia, nasal congestion, rhinorrhea, N/V, atypical extremity weakness/paresthesias. No prior h/o same.  Also c/o L upper dental pain x 3 days.  Severe and radiates to entire L side of face/head.  Has taken aleve and tylenol pm for both dental pain and headache w/out relief.   Past Medical History  Diagnosis Date  . Obesity   . Hypertension     NO MED MD TO GIVE RX   Past Surgical History  Procedure Laterality Date  . Nose surgery      BROKEN   MANY YRS AGO   . Tubal ligation    . Cesarean section    . Carpal tunnel release  01/12/2012    Procedure: CARPAL TUNNEL RELEASE;  Surgeon: Marlowe Shores, MD;  Location: [facility] OR;  Service: Orthopedics;  Laterality: Right;  . Wrist fracture surgery  Feb 2013   History reviewed. No pertinent family history. History  Substance Use Topics  . Smoking status: Current Some Day Smoker  . Smokeless tobacco: Not on file  . Alcohol Use: 0.6 oz/week    1 Glasses of wine per week     Comment: DAILY ALCOHOL    OB History   Grav Para Term Preterm Abortions TAB SAB Ect Mult Living                 Review of Systems  All other systems reviewed and are negative.    Allergies  Hydrocodone  Home Medications   Current Outpatient Rx  Name  Route  Sig  Dispense  Refill  . Ibuprofen-Diphenhydramine HCl (ADVIL PM) 200-25 MG CAPS   Oral    Take 2 tablets by mouth at bedtime as needed (sleep).         Marland Kitchen lisinopril-hydrochlorothiazide (PRINZIDE,ZESTORETIC) 20-12.5 MG per tablet   Oral   Take 1 tablet by mouth daily.         Marland Kitchen LORazepam (ATIVAN) 1 MG tablet   Oral   Take 1 mg by mouth every 8 (eight) hours as needed for anxiety.         . naproxen sodium (ANAPROX) 220 MG tablet   Oral   Take 660 mg by mouth once as needed (for pain).         . traZODone (DESYREL) 100 MG tablet   Oral   Take 100 mg by mouth at bedtime.          BP 141/95  Pulse 97  Temp(Src) 98.7 F (37.1 C) (Oral)  Resp 22  SpO2 98%  LMP 11/13/2011 Physical Exam  Nursing note and vitals reviewed. Constitutional: She is oriented to person, place, and time. She appears well-developed and well-nourished. No distress.  HENT:  Head: Normocephalic and atraumatic.  No tenderness of sinuses or temples.   Mouth exam limited by pain, but there is no visible abscess.  There is plaque on gingiva  adjacent to L upper molars, and these teeth as well as the gingiva are ttp w/ guarding.  No obvious edema of buccal mucosa.   Eyes:  Normal appearance  Neck: Normal range of motion. Neck supple. No rigidity. No Brudzinski's sign and no Kernig's sign noted.  Cardiovascular: Normal rate, regular rhythm and intact distal pulses.   Pulmonary/Chest: Effort normal and breath sounds normal.  Musculoskeletal: Normal range of motion.  Lymphadenopathy:    She has no cervical adenopathy.  Neurological: She is alert and oriented to person, place, and time. No sensory deficit. Coordination normal.  CN 3-12 intact.  No nystagmus. 5/5 and equal upper and lower extremity strength.  No past pointing.     Skin: Skin is warm and dry. No rash noted.  Psychiatric: She has a normal mood and affect. Her behavior is normal.    ED Course  Procedures (including critical care time) Labs Review Labs Reviewed - No data to display Imaging Review Ct Head Wo  Contrast  10/31/2013   CLINICAL DATA:  52 year old female with left-sided headache and left facial swelling.  EXAM: CT HEAD WITHOUT CONTRAST  TECHNIQUE: Contiguous axial images were obtained from the base of the skull through the vertex without intravenous contrast.  COMPARISON:  None.  FINDINGS: No intracranial abnormalities are identified, including mass lesion or mass effect, hydrocephalus, extra-axial fluid collection, midline shift, hemorrhage, or acute infarction.  Mucosal thickening within the left maxillary sinus is noted.  The visualized bony calvarium is unremarkable.  IMPRESSION: No evidence of intracranial abnormality.  Chronic left maxillary sinus disease/ sinusitis.   Electronically Signed   By: Laveda Abbe M.D.   On: 10/31/2013 21:37    EKG Interpretation   None       MDM   1. Headache   2. Sinusitis   3. Pain, dental    52yo obese F w/ HTN presents w/ persistent headache since fall 6 days ago, as well as 3 days of dental pain.  Suspect periapical abscess. Pt to receive 1 percocet as well as first dose penicillin.  She has a dentist to f/u with.  Afebrile, non-toxic and comfortable appearing, no focal neuro deficits and no meningeal signs on exam.  Because this is a new problem at the age of 25 as well as the course of sx and reported severity of pain, CT head ordered and is pending.  9:13 PM   CT head negative w/ exception of L maxillary sinusitis, likely chronic.  Results discussed w/ patient.  On re-examination, L maxillary sinus is very ttp w/ guarding.  Prescribed augmentin for dental infection because that will cover for potential bacterial sinusitis as well.  Headache may be secondary to sinusitis or radiated from tooth.  Also prescribed short course of oxycodone.  Return precautions discussed.     Otilio Miu, PA-C 11/01/13 [phone]

## 2013-11-01 NOTE — ED Provider Notes (Signed)
Medical screening examination/treatment/procedure(s) were performed by non-physician practitioner and as supervising physician I was immediately available for consultation/collaboration.  EKG Interpretation   None         William Takiah Maiden, MD 11/01/13 1935 

## 2013-11-16 ENCOUNTER — Ambulatory Visit: Payer: Self-pay

## 2013-11-16 ENCOUNTER — Encounter: Payer: Self-pay | Admitting: Internal Medicine

## 2013-11-19 ENCOUNTER — Other Ambulatory Visit: Payer: Self-pay

## 2013-11-19 ENCOUNTER — Other Ambulatory Visit: Payer: Self-pay | Admitting: Internal Medicine

## 2013-11-19 MED ORDER — TRAZODONE HCL 100 MG PO TABS: 100.0000 mg | ORAL_TABLET | Freq: Every day | ORAL | Status: DC

## 2013-12-09 ENCOUNTER — Ambulatory Visit: Payer: Self-pay

## 2014-01-29 ENCOUNTER — Emergency Department (HOSPITAL_COMMUNITY)
Admission: EM | Admit: 2014-01-29 | Discharge: 2014-01-29 | Disposition: A | Discharge status: Home / Self Care | Payer: Self-pay | Attending: Emergency Medicine | Admitting: Emergency Medicine

## 2014-01-29 ENCOUNTER — Encounter (HOSPITAL_COMMUNITY): Payer: Self-pay | Admitting: Emergency Medicine

## 2014-01-29 DIAGNOSIS — G8929 Other chronic pain: Secondary | ICD-10-CM | POA: Insufficient documentation

## 2014-01-29 DIAGNOSIS — M170 Bilateral primary osteoarthritis of knee: Secondary | ICD-10-CM

## 2014-01-29 DIAGNOSIS — E669 Obesity, unspecified: Secondary | ICD-10-CM | POA: Insufficient documentation

## 2014-01-29 DIAGNOSIS — M1710 Unilateral primary osteoarthritis, unspecified knee: Secondary | ICD-10-CM | POA: Insufficient documentation

## 2014-01-29 DIAGNOSIS — R209 Unspecified disturbances of skin sensation: Secondary | ICD-10-CM | POA: Insufficient documentation

## 2014-01-29 DIAGNOSIS — IMO0002 Reserved for concepts with insufficient information to code with codable children: Secondary | ICD-10-CM | POA: Insufficient documentation

## 2014-01-29 DIAGNOSIS — F17200 Nicotine dependence, unspecified, uncomplicated: Secondary | ICD-10-CM | POA: Insufficient documentation

## 2014-01-29 DIAGNOSIS — M25561 Pain in right knee: Secondary | ICD-10-CM

## 2014-01-29 DIAGNOSIS — Z79899 Other long term (current) drug therapy: Secondary | ICD-10-CM | POA: Insufficient documentation

## 2014-01-29 DIAGNOSIS — IMO0001 Reserved for inherently not codable concepts without codable children: Secondary | ICD-10-CM | POA: Insufficient documentation

## 2014-01-29 DIAGNOSIS — M25562 Pain in left knee: Secondary | ICD-10-CM

## 2014-01-29 DIAGNOSIS — I10 Essential (primary) hypertension: Secondary | ICD-10-CM | POA: Insufficient documentation

## 2014-01-29 MED ORDER — OXYCODONE-ACETAMINOPHEN 5-325 MG PO TABS
1.0000 | ORAL_TABLET | Freq: Once | ORAL | Status: AC
  Administered 2014-01-29: 1 via ORAL
  Filled 2014-01-29: qty 1

## 2014-01-29 MED ORDER — OXYCODONE-ACETAMINOPHEN 5-325 MG PO TABS: 1.0000 | ORAL_TABLET | ORAL | Status: DC | PRN

## 2014-01-29 NOTE — ED Notes (Signed)
Patient didn't drive today.   Patient states that she is supposed to have bilateral knee replacement, but claims "I am not ready for that yet".   Patient states she just needs something for pain.

## 2014-01-29 NOTE — ED Provider Notes (Signed)
CSN: [id number]     Arrival date & time 01/29/14  1001 History   This chart was scribed for non-physician practitioner, Jeannett Senior, PA-C, working with Mervin Kung, MD by Ladene Artist, ED Scribe. This patient was seen in room TR08C/TR08C and the patient's care was started at 11:20 AM.   Chief Complaint  Patient presents with  . Knee Pain     The history is provided by the patient. No language interpreter was used.    HPI Comments: Kathleen [name] is a 53 y.o. female with history of chronic bilateral knee pain secondary to arthritis who presents to the Emergency Department complaining of sharp bilateral knee pain onset a few days ago, however the right knee pain is more intense. She also states that her knee was warm to touch last night. Pt states that the pain sometimes radiates to her hip and lower back. Pt states that she has swelling in both knees and feels a "pop" with standing. She also reports intermittent numbness in her feet after standing for extended periods of time. Patient has seen her PCP and an orthopedist, Dr. Rip Harbour, regarding her arthritis, and bilateral knee replacements were recommended. She has not pursued due to financial reasons. She has received cortisone shots in the past for relief and her last one was approximately in November 2014. Currently she takes 3 tablets of Aleve per day with minimal relief. Her medical history includes hypertension. She smokes cigarettes.   Past Medical History  Diagnosis Date  . Obesity   . Hypertension     NO MED MD TO GIVE RX   Past Surgical History  Procedure Laterality Date  . Nose surgery      BROKEN   MANY YRS AGO   . Tubal ligation    . Cesarean section    . Carpal tunnel release  01/12/2012    Procedure: CARPAL TUNNEL RELEASE;  Surgeon: Schuyler Amor, MD;  Location: [facility];  Service: Orthopedics;  Laterality: Right;  . Wrist fracture surgery  Feb 2013   History reviewed. No pertinent family  history. History  Substance Use Topics  . Smoking status: Current Some Day Smoker  . Smokeless tobacco: Not on file  . Alcohol Use: 0.6 oz/week    1 Glasses of wine per week     Comment: DAILY ALCOHOL    OB History   Grav Para Term Preterm Abortions TAB SAB Ect Mult Living                 Review of Systems  Musculoskeletal: Positive for arthralgias (bilateral knee pain), back pain and myalgias.  Neurological: Positive for numbness. Negative for weakness.      Allergies  Hydrocodone  Home Medications   Current Outpatient Rx  Name  Route  Sig  Dispense  Refill  . lisinopril-hydrochlorothiazide (PRINZIDE,ZESTORETIC) 20-12.5 MG per tablet   Oral   Take 1 tablet by mouth daily.         Marland Kitchen LORazepam (ATIVAN) 1 MG tablet   Oral   Take 1 mg by mouth every 8 (eight) hours as needed for anxiety.         . naproxen sodium (ANAPROX) 220 MG tablet   Oral   Take 660 mg by mouth once as needed (for pain).         . traZODone (DESYREL) 100 MG tablet   Oral   Take 1 tablet (100 mg total) by mouth at bedtime.   30 tablet   1  Triage Vitals: BP 157/121  Pulse 84  Temp(Src) 98 F (36.7 C) (Oral)  Resp 17  Wt 320 lb (145.151 kg)  SpO2 99%  LMP 11/13/2011 Physical Exam  Nursing note and vitals reviewed. Constitutional: She is oriented to person, place, and time. She appears well-developed and well-nourished. No distress.  HENT:  Head: Normocephalic and atraumatic.  Eyes: EOM are normal.  Neck: Neck supple. No tracheal deviation present.  Cardiovascular: Normal rate.   Pulmonary/Chest: Effort normal. No respiratory distress.  Musculoskeletal: Normal range of motion.  Normal appearing bilateral knees. No obvious swelling, erythema, warmth to the touch. Pain with flexion and extension of the knee joints bilaterally. Diffuse tenderness over bilateral anterior, feel, lateral knee joints. Negative anterior posterior drawer signs. Dorsal pedal pulses intact bilaterally.   Neurological: She is alert and oriented to person, place, and time.  Skin: Skin is warm and dry.  Psychiatric: She has a normal mood and affect. Her behavior is normal.    ED Course  Procedures (including critical care time) DIAGNOSTIC STUDIES: Oxygen Saturation is 99% on room air, normal by my interpretation.    COORDINATION OF CARE: 11:28 AM- Patient informed of current plan for treatment and evaluation and agrees with plan at this time.     Labs Review Labs Reviewed - No data to display   Imaging Review No results found.  EKG Interpretation  None  MDM   Final diagnoses:  Knee pain, bilateral  Osteoarthritis of both knees    Patient with chronic bilateral knee pain, history and prior x-ray showing tricompartmental osteoarthritis. Patient was told she needed knee replacements but states she's not ready and financially not there yet. Patient is followed by [facility] orthoped[facility]. She is here for pain control. There is no signs of infections of the joints. Will treat with oxycodone, followup with orthopedics. Also discussed weight management.  Filed Vitals:   01/29/14 1004 01/29/14 1157  BP: 157/121 152/103  Pulse: 84 87  Temp: 98 F (36.7 C)   TempSrc: Oral   Resp: 17 16  Weight: 320 lb (145.151 kg)   SpO2: 99% 98%   I personally performed the services described in this documentation, which was scribed in my presence. The recorded information has been reviewed and is accurate.    Renold Genta, PA-C 01/29/14 1649

## 2014-01-29 NOTE — ED Notes (Signed)
Per pt sts chronic bilateral knee pain. sts she has been taking aleve with minimal relief.

## 2014-01-29 NOTE — Discharge Instructions (Signed)
Alternate ice and heat at home. Continue aleve. Take percocet as prescribed as needed for severe pain. Follow up with your orthopedics doctor for recheck as soon as able.   Wear and Tear Disorders of the Knee (Arthritis, Osteoarthritis) Everyone will experience wear and tear injuries (arthritis, osteoarthritis) of the knee. These are the changes we all get as we age. They come from the joint stress of daily living. The amount of cartilage damage in your knee and your symptoms determine if you need surgery. Mild problems require approximately two months recovery time. More severe problems take several months to recover. With mild problems, your surgeon may find worn and rough cartilage surfaces. With severe changes, your surgeon may find cartilage that has completely worn away and exposed the bone. Loose bodies of bone and cartilage, bone spurs (excess bone growth), and injuries to the menisci (cushions between the large bones of your leg) are also common. All of these problems can cause pain. For a mild wear and tear problem, rough cartilage may simply need to be shaved and smoothed. For more severe problems with areas of exposed bone, your surgeon may use an instrument for roughing up the bone surfaces to stimulate new cartilage growth. Loose bodies are usually removed. Torn menisci may be trimmed or repaired. ABOUT THE ARTHROSCOPIC PROCEDURE Arthroscopy is a surgical technique. It allows your orthopedic surgeon to diagnose and treat your knee injury with accuracy. The surgeon looks into your knee through a small scope. The scope is like a small (pencil-sized) telescope. Arthroscopy is less invasive than open knee surgery. You can expect a more rapid recovery. After the procedure, you will be moved to a recovery area until most of the effects of the medication have worn off. Your caregiver will discuss the test results with you. RECOVERY The severity of the arthritis and the type of procedure performed will  determine recovery time. Other important factors include age, physical condition, medical conditions, and the type of rehabilitation program. Strengthening your muscles after arthroscopy helps guarantee a better recovery. Follow your caregiver's instructions. Use crutches, rest, elevate, ice, and do knee exercises as instructed. Your caregivers will help you and instruct you with exercises and other physical therapy required to regain your mobility, muscle strength, and functioning following surgery. Only take over-the-counter or prescription medicines for pain, discomfort, or fever as directed by your caregiver.  SEEK MEDICAL CARE IF:   There is increased bleeding (more than a small spot) from the wound.  You notice redness, swelling, or increasing pain in the wound.  Pus is coming from wound.  You develop an unexplained oral temperature above 102 F (38.9 C) , or as your caregiver suggests.  You notice a foul smell coming from the wound or dressing.  You have severe pain with motion of the knee. SEEK IMMEDIATE MEDICAL CARE IF:   You develop a rash.  You have difficulty breathing.  You have any allergic problems. MAKE SURE YOU:   Understand these instructions.  Will watch your condition.  Will get help right away if you are not doing well or get worse. Document Released: 11/16/2000 Document Revised: 02/11/2012 Document Reviewed: 04/14/2008 [facility] Patient Information 2014 [employer], [place].

## 2014-02-01 NOTE — ED Provider Notes (Signed)
Medical screening examination/treatment/procedure(s) were performed by non-physician practitioner and as supervising physician I was immediately available for consultation/collaboration.   EKG Interpretation None        Mervin Kung, MD 02/01/14 1012

## 2014-04-11 ENCOUNTER — Encounter (HOSPITAL_COMMUNITY): Payer: Self-pay | Admitting: Emergency Medicine

## 2014-04-11 ENCOUNTER — Emergency Department (HOSPITAL_COMMUNITY)
Admission: EM | Admit: 2014-04-11 | Discharge: 2014-04-11 | Disposition: A | Discharge status: Home / Self Care | Payer: Self-pay | Attending: Emergency Medicine | Admitting: Emergency Medicine

## 2014-04-11 ENCOUNTER — Emergency Department (HOSPITAL_COMMUNITY): Payer: Self-pay

## 2014-04-11 DIAGNOSIS — I10 Essential (primary) hypertension: Secondary | ICD-10-CM | POA: Insufficient documentation

## 2014-04-11 DIAGNOSIS — D259 Leiomyoma of uterus, unspecified: Secondary | ICD-10-CM | POA: Insufficient documentation

## 2014-04-11 DIAGNOSIS — A599 Trichomoniasis, unspecified: Secondary | ICD-10-CM

## 2014-04-11 DIAGNOSIS — A5901 Trichomonal vulvovaginitis: Secondary | ICD-10-CM | POA: Insufficient documentation

## 2014-04-11 DIAGNOSIS — Z9851 Tubal ligation status: Secondary | ICD-10-CM | POA: Insufficient documentation

## 2014-04-11 DIAGNOSIS — E669 Obesity, unspecified: Secondary | ICD-10-CM | POA: Insufficient documentation

## 2014-04-11 DIAGNOSIS — Z79899 Other long term (current) drug therapy: Secondary | ICD-10-CM | POA: Insufficient documentation

## 2014-04-11 DIAGNOSIS — F17200 Nicotine dependence, unspecified, uncomplicated: Secondary | ICD-10-CM | POA: Insufficient documentation

## 2014-04-11 LAB — COMPREHENSIVE METABOLIC PANEL
ALBUMIN: 3.6 g/dL (ref 3.5–5.2)
ALT: 19 U/L (ref 0–35)
AST: 22 U/L (ref 0–37)
Alkaline Phosphatase: 84 U/L (ref 39–117)
BUN: 11 mg/dL (ref 6–23)
CALCIUM: 9.4 mg/dL (ref 8.4–10.5)
CO2: 24 mEq/L (ref 19–32)
Chloride: 103 mEq/L (ref 96–112)
Creatinine, Ser: 0.65 mg/dL (ref 0.50–1.10)
GFR calc non Af Amer: >90 mL/min (ref >90)
GLUCOSE: 105 mg/dL — AB (ref 70–99)
Potassium: 4.1 mEq/L (ref 3.7–5.3)
Sodium: 140 mEq/L (ref 137–147)
TOTAL PROTEIN: 7.4 g/dL (ref 6.0–8.3)
Total Bilirubin: 0.3 mg/dL (ref 0.3–1.2)

## 2014-04-11 LAB — BASIC METABOLIC PANEL
BUN: 11 mg/dL (ref 6–23)
CALCIUM: 9.4 mg/dL (ref 8.4–10.5)
CO2: 24 meq/L (ref 19–32)
Chloride: 102 mEq/L (ref 96–112)
Creatinine, Ser: 0.66 mg/dL (ref 0.50–1.10)
GFR calc Af Amer: >90 mL/min (ref >90)
GFR calc non Af Amer: >90 mL/min (ref >90)
GLUCOSE: 111 mg/dL — AB (ref 70–99)
Potassium: 4.1 mEq/L (ref 3.7–5.3)
Sodium: 139 mEq/L (ref 137–147)

## 2014-04-11 LAB — LIPASE, BLOOD: LIPASE: 18 U/L (ref 11–59)

## 2014-04-11 LAB — CBC WITH DIFFERENTIAL/PLATELET
Basophils Absolute: 0 10*3/uL (ref 0.0–0.1)
Basophils Relative: 0 % (ref 0–1)
EOS ABS: 0 10*3/uL (ref 0.0–0.7)
EOS PCT: 1 % (ref 0–5)
HCT: 39.8 % (ref 36.0–46.0)
Hemoglobin: 12.3 g/dL (ref 12.0–15.0)
LYMPHS ABS: 1.3 10*3/uL (ref 0.7–4.0)
Lymphocytes Relative: 16 % (ref 12–46)
MCH: 25.5 pg — ABNORMAL LOW (ref 26.0–34.0)
MCHC: 30.9 g/dL (ref 30.0–36.0)
MCV: 82.6 fL (ref 78.0–100.0)
Monocytes Absolute: 0.4 10*3/uL (ref 0.1–1.0)
Monocytes Relative: 5 % (ref 3–12)
Neutro Abs: 6.5 10*3/uL (ref 1.7–7.7)
Neutrophils Relative %: 78 % — ABNORMAL HIGH (ref 43–77)
PLATELETS: 179 10*3/uL (ref 150–400)
RBC: 4.82 MIL/uL (ref 3.87–5.11)
RDW: 14.8 % (ref 11.5–15.5)
WBC: 8.3 10*3/uL (ref 4.0–10.5)

## 2014-04-11 LAB — WET PREP, GENITAL
CLUE CELLS WET PREP: NONE SEEN
WBC WET PREP: NONE SEEN
Yeast Wet Prep HPF POC: NONE SEEN

## 2014-04-11 LAB — RPR

## 2014-04-11 LAB — HIV ANTIBODY (ROUTINE TESTING W REFLEX): HIV 1&2 Ab, 4th Generation: NONREACTIVE

## 2014-04-11 MED ORDER — OXYCODONE-ACETAMINOPHEN 5-325 MG PO TABS: 2.0000 | ORAL_TABLET | Freq: Once | ORAL | Status: DC

## 2014-04-11 MED ORDER — ONDANSETRON 4 MG PO TBDP
4.0000 mg | ORAL_TABLET | Freq: Once | ORAL | Status: AC
  Administered 2014-04-11: 4 mg via ORAL
  Filled 2014-04-11: qty 1

## 2014-04-11 MED ORDER — OXYCODONE-ACETAMINOPHEN 5-325 MG PO TABS: 1.0000 | ORAL_TABLET | Freq: Four times a day (QID) | ORAL | Status: DC | PRN

## 2014-04-11 MED ORDER — HYDROMORPHONE HCL PF 1 MG/ML IJ SOLN
1.0000 mg | Freq: Once | INTRAMUSCULAR | Status: AC
  Administered 2014-04-11: 1 mg via INTRAVENOUS
  Filled 2014-04-11: qty 1

## 2014-04-11 MED ORDER — ONDANSETRON HCL 4 MG/2ML IJ SOLN: 4.0000 mg | Freq: Once | INTRAMUSCULAR | Status: DC

## 2014-04-11 MED ORDER — METRONIDAZOLE 500 MG PO TABS
2000.0000 mg | ORAL_TABLET | Freq: Once | ORAL | Status: AC
  Administered 2014-04-11: 2000 mg via ORAL
  Filled 2014-04-11: qty 4

## 2014-04-11 NOTE — ED Provider Notes (Signed)
CSN: [id number]     Arrival date & time 04/11/14  1040 History   First MD Initiated Contact with Patient 04/11/14 1112     Chief Complaint  Patient presents with  . Abdominal Pain     (Consider location/radiation/quality/duration/timing/severity/associated sxs/prior Treatment) HPI Kathleen [name] is a(n) 53 y.o. female who presents the emergency department with chief complaint of vaginal bleeding, abdominal pain and back pain. She is perimenopausal. She states that the longest time she has gone without having a period has been 4 months. Patient states that she got her period last month and started her period 2 nights ago again. She states that at 3 AM on Saturday, 04/10/2014 she'll from sleep with vaginal bleeding, severe pelvic and lower back pain. She states that she took ibuprofen, Tylenol without relief of her symptoms. She also used warm compresses on her stomach in her lower back. She states that over the course of the past day and half she has had increasingly worsening severe pain. She has difficulty describing it but states that "her vagina is on fire." She complains of pain in the pubic symphysis to she points. She states that it is constant, pressure-like and she feels the sensation to have a bowel movement. Patient states that she has been having normal bowel movements and denies constipation. She also states she does not feel she could be pregnant. She states that she noticed some blood clots when she urinated which were from her vagina. She denies a history of similar symptoms as today. She states this is the worst pain she's ever had. She has associated nausea and one episode of vomiting nonbloody nonbilious vomitus. Patient denies any flank pain or urinary symptoms. She denies a history of kidney stones. Denies fevers, chills, myalgias, arthralgias. Denies DOE, SOB, chest tightness or pressure, radiation to left arm, jaw or back, or diaphoresis. Denies dysuria, flank pain, suprapubic  pain, frequency, urgency, or hematuria. Denies headaches, light headedness, weakness, visual disturbances.   Past Medical History  Diagnosis Date  . Obesity   . Hypertension     NO MED MD TO GIVE RX   Past Surgical History  Procedure Laterality Date  . Nose surgery      BROKEN   MANY YRS AGO   . Tubal ligation    . Cesarean section    . Carpal tunnel release  01/12/2012    Procedure: CARPAL TUNNEL RELEASE;  Surgeon: Schuyler Amor, MD;  Location: [facility];  Service: Orthopedics;  Laterality: Right;  . Wrist fracture surgery  Feb 2013   No family history on file. History  Substance Use Topics  . Smoking status: Current Some Day Smoker  . Smokeless tobacco: Not on file  . Alcohol Use: 0.6 oz/week    1 Glasses of wine per week     Comment: DAILY ALCOHOL    OB History   Grav Para Term Preterm Abortions TAB SAB Ect Mult Living                 Review of Systems  Ten systems reviewed and are negative for acute change, except as noted in the HPI.    Allergies  Hydrocodone  Home Medications   Prior to Admission medications   Medication Sig Start Date End Date Taking? Authorizing Provider  lisinopril-hydrochlorothiazide (PRINZIDE,ZESTORETIC) 20-12.5 MG per tablet Take 1 tablet by mouth daily.    Historical Provider, MD  LORazepam (ATIVAN) 1 MG tablet Take 1 mg by mouth every 8 (eight) hours as  needed for anxiety.    Historical Provider, MD  naproxen sodium (ANAPROX) 220 MG tablet Take 660 mg by mouth once as needed (for pain).    Historical Provider, MD  oxyCODONE-acetaminophen (PERCOCET) 5-325 MG per tablet Take 1 tablet by mouth every 4 (four) hours as needed for severe pain. 01/29/14   Tatyana A Kirichenko, PA-C  traZODone (DESYREL) 100 MG tablet Take 1 tablet (100 mg total) by mouth at bedtime. 11/19/13   Shanker Kristeen Mans, MD   BP 172/122  Pulse 74  Temp(Src) 99 F (37.2 C) (Oral)  Resp 16  Wt 280 lb (127.007 kg)  SpO2 100%  LMP 11/13/2011 Physical Exam Physical  Exam  Nursing note and vitals reviewed. Constitutional: She is oriented to person, place, and time. She appears well-developed and well-nourished. Patient appears extremely uncomfortable. She looks as if she cannot find a comfortable position. HENT:  Head: Normocephalic and atraumatic.  Eyes: Conjunctivae normal and EOM are normal. Pupils are equal, round, and reactive to light. No scleral icterus.  Neck: Normal range of motion.  Cardiovascular: Normal rate, regular rhythm and normal heart sounds.  Exam reveals no gallop and no friction rub.   No murmur heard. Pulmonary/Chest: Effort normal and breath sounds normal. No respiratory distress.  Abdominal: Soft. Bowel sounds are normal. She exhibits no distension and no mass. Patient is tender to palpation at the pubic symphysis.  Neurological: She is alert and oriented to person, place, and time.  Skin: Skin is warm and dry. She is not diaphoretic.    ED Course  Procedures (including critical care time) Labs Review Labs Reviewed  WET PREP, GENITAL - Abnormal; Notable for the following:    Trich, Wet Prep RARE (*)    All other components within normal limits  BASIC METABOLIC PANEL - Abnormal; Notable for the following:    Glucose, Bld 111 (*)    All other components within normal limits  CBC WITH DIFFERENTIAL - Abnormal; Notable for the following:    MCH 25.5 (*)    Neutrophils Relative % 78 (*)    All other components within normal limits  COMPREHENSIVE METABOLIC PANEL - Abnormal; Notable for the following:    Glucose, Bld 105 (*)    All other components within normal limits  GC/CHLAMYDIA PROBE AMP  LIPASE, BLOOD  URINALYSIS, ROUTINE W REFLEX MICROSCOPIC  RPR  HIV ANTIBODY (ROUTINE TESTING)    Imaging Review No results found.   EKG Interpretation None      MDM   Final diagnoses:  None    12:07 PM BP 172/122  Pulse 74  Temp(Src) 99 F (37.2 C) (Oral)  Resp 16  Wt 280 lb (127.007 kg)  SpO2 100%  LMP  11/13/2011 Patient is hypertensive. This is likely due to her pain. Her BMP is unremarkable. Except for slightly elevated glucose. Her CBC is without acute abnormality. Pelvic examination is pending.  1:57 PM BP 158/99  Pulse 79  Temp(Src) 99 F (37.2 C) (Oral)  Resp 20  Wt 280 lb (127.007 kg)  SpO2 98%  LMP 11/13/2011  Pelvic exam: VULVA: normal appearing vulva with no masses, tenderness or lesions, VAGINA: normal appearing vagina with normal color and discharge, no lesions, CERVIX: normal appearing cervix without discharge or lesions, bleeding, UTERUS: uterus is normal size, shape, consistency and nontender, unable to palpate, ADNEXA: nontender, exam limited by Obesity, exam chaperoned by [employer] + for trich. Will treat wit flagyl 2g here in the ED. Hyperglycemia. Her htn is decreasing.  Filed Vitals:   04/11/14 1052 04/11/14 1313 04/11/14 1531  BP: 172/122 158/99 163/98  Pulse: 74 79 71  Temp: 99 F (37.2 C)    TempSrc: Oral    Resp: 16 20 19   Weight: 280 lb (127.007 kg)    SpO2: 100% 98% 96%    Patient US shows uterine fibroid. contines to be hypertensive. Discussed that she need s pcp / ob follow up . Discussed htn. Her sxs today are likelydue to her fibroid. Discussed need for partner treatment for trich. The patient appears reasonably screened and/or stabilized for discharge and I doubt any other medical condition or other [name] requiring further screening, evaluation, or treatment in the ED at this time prior to discharge.      Margarita Mail, PA-C 04/13/14 [phone]

## 2014-04-11 NOTE — ED Notes (Addendum)
Patient reports onset of lower abdominal pain/vaginal pain last night.  She states she had a light period last month.  She states she has spotting last night and today.  She states her pain radiates into her back.  Patient reports she has also had n/v.  She took muscle relaxer at 0300 and aleve at 0730

## 2014-04-11 NOTE — Discharge Instructions (Signed)
Uterine Fibroid A uterine fibroid is a growth (tumor) that occurs in your uterus. This type of tumor is not cancerous and does not spread out of the uterus. You can have one or many fibroids. Fibroids can vary in size, weight, and where they grow in the uterus. Some can become quite large. Most fibroids do not require medical treatment, but some can cause pain or heavy bleeding during and between periods. CAUSES  A fibroid is the result of a single uterine cell that keeps growing (unregulated), which is different than most cells in the human body. Most cells have a control mechanism that keeps them from reproducing without control.  SIGNS AND SYMPTOMS   Bleeding.  Pelvic pain and pressure.  Bladder problems due to the size of the fibroid.  Infertility and miscarriages depending on the size and location of the fibroid. DIAGNOSIS  Uterine fibroids are diagnosed through a physical exam. Your health care provider may feel the lumpy tumors during a pelvic exam. Ultrasonography may be done to get information regarding size, location, and number of tumors.  TREATMENT   Your health care provider may recommend watchful waiting. This involves getting the fibroid checked by your health care provider to see if it grows or shrinks.   Hormone treatment or an intrauterine device (IUD) may be prescribed.   Surgery may be needed to remove the fibroids (myomectomy) or the uterus (hysterectomy). This depends on your situation. When fibroids interfere with fertility and a woman wants to become pregnant, a health care provider may recommend having the fibroids removed.  [place] care depends on how you were treated. In general:   Keep all follow-up appointments with your health care provider.   Only take over-the-counter or prescription medicines as directed by your health care provider. If you were prescribed a hormone treatment, take the hormone medicines exactly as directed. Do not  take aspirin. It can cause bleeding.   Talk to your health care provider about taking iron pills.  If your periods are troublesome but not so heavy, lie down with your feet raised slightly above your heart. Place cold packs on your lower abdomen.   If your periods are heavy, write down the number of pads or tampons you use per month. Bring this information to your health care provider.   Include green vegetables in your diet.  SEEK IMMEDIATE MEDICAL CARE IF:  You have pelvic pain or cramps not controlled with medicines.   You have a sudden increase in pelvic pain.   You have an increase in bleeding between and during periods.   You have excessive periods and soak tampons or pads in a half hour or less.  You feel lightheaded or have fainting episodes. Document Released: 11/16/2000 Document Revised: 09/09/2013 Document Reviewed: 06/18/2013 [facility] Patient Information 2014 [place], [place].  Trichomoniasis Trichomoniasis is an infection, caused by the Trichomonas organism, that affects both women and men. In women, the outer female genitalia and the vagina are affected. In men, the penis is mainly affected, but the prostate and other reproductive organs can also be involved. Trichomoniasis is a sexually transmitted disease (STD) and is most often passed to another person through sexual contact. The majority of people who get trichomoniasis do so from a sexual encounter and are also at risk for other STDs. CAUSES   Sexual intercourse with an infected partner.  It can be present in swimming pools or hot tubs. SYMPTOMS   Abnormal gray-green frothy vaginal discharge in women.  Vaginal itching and irritation in women.  Itching and irritation of the area outside the vagina in women.  Penile discharge with or without pain in males.  Inflammation of the urethra (urethritis), causing painful urination.  Bleeding after sexual intercourse. RELATED COMPLICATIONS  Pelvic  inflammatory disease.  Infection of the uterus (endometritis).  Infertility.  Tubal (ectopic) pregnancy.  It can be associated with other STDs, including gonorrhea and chlamydia, hepatitis B, and HIV. COMPLICATIONS DURING PREGNANCY  Early (premature) delivery.  Premature rupture of the membranes (PROM).  Low birth weight. DIAGNOSIS   Visualization of Trichomonas under the microscope from the vagina discharge.  Ph of the vagina greater than 4.5, tested with a test tape.  Trich Rapid Test.  Culture of the organism, but this is not usually needed.  It may be found on a Pap test.  Having a "strawberry cervix,"which means the cervix looks very red like a strawberry. TREATMENT   You may be given medication to fight the infection. Inform your caregiver if you could be or are pregnant. Some medications used to treat the infection should not be taken during pregnancy.  Over-the-counter medications or creams to decrease itching or irritation may be recommended.  Your sexual partner will need to be treated if infected. HOME CARE INSTRUCTIONS   Take all medication prescribed by your caregiver.  Take over-the-counter medication for itching or irritation as directed by your caregiver.  Do not have sexual intercourse while you have the infection.  Do not douche or wear tampons.  Discuss your infection with your partner, as your partner may have acquired the infection from you. Or, your partner may have been the person who transmitted the infection to you.  Have your sex partner examined and treated if necessary.  Practice safe, informed, and protected sex.  See your caregiver for other STD testing. SEEK MEDICAL CARE IF:   You still have symptoms after you finish the medication.  You have an oral temperature above 102 F (38.9 C).  You develop belly (abdominal) pain.  You have pain when you urinate.  You have bleeding after sexual intercourse.  You develop a  rash.  The medication makes you sick or makes you throw up (vomit). Document Released: 05/15/2001 Document Revised: 02/11/2012 Document Reviewed: 06/10/2009 [facility] Patient Information 2014 [place], [place].

## 2014-04-11 NOTE — ED Notes (Signed)
Pt states vaginal pain and vaginal bleeding. Also states pain to lower back. 7/10 pain at the time. States "my vagina is on fire." denies urinary symptoms. Pt is alert and oriented x4.

## 2014-04-11 NOTE — ED Notes (Signed)
The patient is unable to give an urine specimen at this time. The patient has been advised to use call for assistance to the restroom. The tech has reported to the RN in charge. 

## 2014-04-11 NOTE — ED Notes (Signed)
CBG 108. 

## 2014-04-11 NOTE — ED Notes (Signed)
Pelvic cart set up at bedside  

## 2014-04-12 LAB — GC/CHLAMYDIA PROBE AMP
CT PROBE, AMP APTIMA: NEGATIVE
GC Probe RNA: NEGATIVE

## 2014-04-20 NOTE — ED Provider Notes (Signed)
Medical screening examination/treatment/procedure(s) were performed by non-physician practitioner and as supervising physician I was immediately available for consultation/collaboration.   EKG Interpretation None      Rolland Porter, MD, Abram Sander   Janice Norrie, MD 04/20/14 1308

## 2014-04-22 ENCOUNTER — Encounter: Payer: Self-pay | Admitting: Obstetrics & Gynecology

## 2014-04-22 ENCOUNTER — Ambulatory Visit (INDEPENDENT_AMBULATORY_CARE_PROVIDER_SITE_OTHER): Payer: Self-pay | Admitting: Obstetrics & Gynecology

## 2014-04-22 DIAGNOSIS — N920 Excessive and frequent menstruation with regular cycle: Secondary | ICD-10-CM

## 2014-04-22 MED ORDER — MISOPROSTOL 200 MCG PO TABS: ORAL_TABLET | ORAL | Status: DC

## 2014-04-22 NOTE — Progress Notes (Signed)
   Subjective:    Patient ID: Kathleen [name], female    DOB: 02-24-1961, 53 y.o.   MRN: [id number]  HPI 53 yo AA lady with morbid obesity and HTN is here for follow up after being seen in the ER for a heavy period. Her hbg was found to be 12.8. Her u/s showed a 2.5 cm intramural fibroid.   Review of Systems She will get a pap smear at the free cancer screening clinic.    Objective:   Physical Exam        Assessment & Plan:  Menorrhagia/perimenopause with multiple comorbidities. While she initially tells me that she just "wants it out", I have explained that 600 people per year in the [place] die from complications of a hysterectomy. I have recommended a Mirena instead. I did offer depo provera and megace. After demonstration with the Mirena model, she has agreed to try this method. I will prescribe cytotec to be taken orally the night prior to Mirena placement. She will fill out the paperwork to get the free Mirena from the company.

## 2014-05-14 ENCOUNTER — Encounter: Payer: Self-pay | Admitting: *Deleted

## 2014-05-14 ENCOUNTER — Other Ambulatory Visit: Payer: Self-pay | Admitting: Internal Medicine

## 2014-05-14 DIAGNOSIS — M25469 Effusion, unspecified knee: Secondary | ICD-10-CM

## 2014-05-14 DIAGNOSIS — I10 Essential (primary) hypertension: Secondary | ICD-10-CM

## 2014-05-14 MED ORDER — DICLOFENAC SODIUM 1 % TD GEL: 4.0000 g | Freq: Three times a day (TID) | TRANSDERMAL | Status: DC | PRN

## 2014-05-14 MED ORDER — LISINOPRIL-HYDROCHLOROTHIAZIDE 20-25 MG PO TABS: 1.0000 | ORAL_TABLET | Freq: Every day | ORAL | Status: DC

## 2014-05-14 MED ORDER — DICLOFENAC SODIUM 1 % TD GEL: 2.0000 g | Freq: Four times a day (QID) | TRANSDERMAL | Status: DC

## 2014-05-14 NOTE — Progress Notes (Unsigned)
Patient ID: Kathleen [name], female   DOB: [birth year], 53 y.o.   MRN: [id number] Patient here today c/o right  knee swelling and pain. Patient is being seen at the orthopedist for knee injections. Patient states the orthopedist office will not see her until she pays up on her bill. Patient also needing a refill on her HTN medication. Consulted with Roney Jaffe, NP who prescribed Diclofenac gel. Patient HTN medication refilled for one month until she sees Dr. Dereck Leep. Kathleen Birmingham, RN

## 2014-06-12 ENCOUNTER — Encounter (HOSPITAL_COMMUNITY): Payer: Self-pay | Admitting: Emergency Medicine

## 2014-06-12 ENCOUNTER — Emergency Department (HOSPITAL_COMMUNITY)
Admission: EM | Admit: 2014-06-12 | Discharge: 2014-06-12 | Disposition: A | Discharge status: Home / Self Care | Payer: Medicaid Other | Attending: Emergency Medicine | Admitting: Emergency Medicine

## 2014-06-12 DIAGNOSIS — M25561 Pain in right knee: Secondary | ICD-10-CM

## 2014-06-12 DIAGNOSIS — Z9889 Other specified postprocedural states: Secondary | ICD-10-CM | POA: Diagnosis not present

## 2014-06-12 DIAGNOSIS — Z79899 Other long term (current) drug therapy: Secondary | ICD-10-CM | POA: Diagnosis not present

## 2014-06-12 DIAGNOSIS — F17200 Nicotine dependence, unspecified, uncomplicated: Secondary | ICD-10-CM | POA: Diagnosis not present

## 2014-06-12 DIAGNOSIS — M79609 Pain in unspecified limb: Secondary | ICD-10-CM | POA: Diagnosis not present

## 2014-06-12 DIAGNOSIS — Z791 Long term (current) use of non-steroidal anti-inflammatories (NSAID): Secondary | ICD-10-CM | POA: Diagnosis not present

## 2014-06-12 DIAGNOSIS — E669 Obesity, unspecified: Secondary | ICD-10-CM | POA: Insufficient documentation

## 2014-06-12 DIAGNOSIS — G8929 Other chronic pain: Secondary | ICD-10-CM | POA: Insufficient documentation

## 2014-06-12 DIAGNOSIS — Z8679 Personal history of other diseases of the circulatory system: Secondary | ICD-10-CM

## 2014-06-12 DIAGNOSIS — I10 Essential (primary) hypertension: Secondary | ICD-10-CM | POA: Diagnosis not present

## 2014-06-12 MED ORDER — NAPROXEN 500 MG PO TABS: 500.0000 mg | ORAL_TABLET | Freq: Two times a day (BID) | ORAL | Status: DC

## 2014-06-12 MED ORDER — TRAMADOL HCL 50 MG PO TABS: 50.0000 mg | ORAL_TABLET | Freq: Four times a day (QID) | ORAL | Status: DC | PRN

## 2014-06-12 NOTE — ED Notes (Signed)
Discharge instructions reviewed with pt. Pt verbalized understanding.   

## 2014-06-12 NOTE — Discharge Instructions (Signed)
Call an orthopedic specialist for further evaluation of your knee pain Call for a follow up appointment with a Family or Primary Care Provider.  Return if Symptoms worsen.   Take medication as prescribed.  Ice your knees and elevate above your heart when you're not walking or standing.

## 2014-06-12 NOTE — ED Notes (Signed)
The pt has had pain in her rt knee and pain in the heel of her lt foot pain for 2-3 days.  Hx of the same.  No recent injury

## 2014-06-12 NOTE — ED Provider Notes (Signed)
Medical screening examination/treatment/procedure(s) were performed by non-physician practitioner and as supervising physician I was immediately available for consultation/collaboration.   EKG Interpretation None       Kalman Drape, MD 06/12/14 [phone]

## 2014-06-12 NOTE — ED Notes (Signed)
Pt here with c/o rt knee and foot pain. Pt reports her knee is bone to bone and she could not afford to continue to follow up with the orthopedist she was seeing so she would like a new referral. Pt states she has swelling in the rt leg when she stands on it too long, and it is becoming increasing difficult to get up from a sitting position. Pt states she is supposed to go to her pcp on the 16th but the pain has gotten unbearable and aleve, ice and elevation is not working. Pt rates pain 9/10.

## 2014-06-12 NOTE — ED Notes (Signed)
The pt was here ealrier tonight but decided not to wait too many people waiting

## 2014-06-12 NOTE — ED Provider Notes (Signed)
CSN: [id number]     Arrival date & time 06/12/14  0148 History   First MD Initiated Contact with Patient 06/12/14 [phone]     Chief Complaint  Patient presents with  . Leg Pain     (Consider location/radiation/quality/duration/timing/severity/associated sxs/prior Treatment) HPI Comments: Patient is a 53 year old female with past medical history of obesity, hypertension, presents emergency room chief complaint of right knee pain for several days. The patient reports chronic pain to right knee with chronic swelling. She reports previously diagnosed with "bone on bone", and was told she wound need surgery to correct by Dr. Rip Harbour. Worsened by standing on feet all day. She denies recent injury. She reports she has seen an orthopedist in the past and has had multiple cortisone injections into bilateral knees. She is requesting a referral to an orthopedist due to an outstanding bill at her current orthopedic office. She is also requesting a new antihypertensive medications. Stating she still has lisinopril/HCTZ but did not take prior to arrival.  Patient is a 53 y.o. female presenting with leg pain. The history is provided by the patient. No language interpreter was used.  Leg Pain Associated symptoms: no fever     Past Medical History  Diagnosis Date  . Obesity   . Hypertension     NO MED MD TO GIVE RX   Past Surgical History  Procedure Laterality Date  . Nose surgery      BROKEN   MANY YRS AGO   . Tubal ligation    . Cesarean section    . Carpal tunnel release  01/12/2012    Procedure: CARPAL TUNNEL RELEASE;  Surgeon: Schuyler Amor, MD;  Location: [facility];  Service: Orthopedics;  Laterality: Right;  . Wrist fracture surgery  Feb 2013   No family history on file. History  Substance Use Topics  . Smoking status: Current Some Day Smoker  . Smokeless tobacco: Never Used  . Alcohol Use: 0.6 oz/week    1 Glasses of wine per week     Comment: DAILY ALCOHOL    OB History   Grav Para Term  Preterm Abortions TAB SAB Ect Mult Living                 Review of Systems  Constitutional: Negative for fever and chills.  Musculoskeletal: Positive for arthralgias and joint swelling.  Skin: Negative for color change and wound.      Allergies  Hydrocodone  Home Medications   Prior to Admission medications   Medication Sig Start Date End Date Taking? Authorizing Provider  lisinopril-hydrochlorothiazide (PRINZIDE,ZESTORETIC) 20-25 MG per tablet Take 1 tablet by mouth daily. 05/14/14  Yes Lance Bosch, NP  naproxen sodium (ANAPROX) 220 MG tablet Take 660 mg by mouth daily.    Yes Historical Provider, MD   BP 160/113  Pulse 75  Temp(Src) 98.3 F (36.8 C) (Oral)  Resp 19  Ht 5' 6.5" (1.689 m)  Wt 322 lb (146.058 kg)  BMI 51.20 kg/m2  SpO2 96%  LMP 11/13/2011 Physical Exam  Nursing note and vitals reviewed. Constitutional: She is oriented to person, place, and time. She appears well-developed and well-nourished.  Non-toxic appearance. She does not have a sickly appearance. She does not appear ill. No distress.  HENT:  Head: Normocephalic and atraumatic.  Neck: Neck supple.  Pulmonary/Chest: Effort normal. No respiratory distress.  Musculoskeletal:       Right knee: She exhibits normal range of motion, no ecchymosis, no deformity, no erythema, normal alignment and  normal patellar mobility. Tenderness found. Medial joint line and lateral joint line tenderness noted.  Mild tenderness to palpation of bilateral joint lines. No increase warmth to touch, no overlying erythema.  Neurological: She is alert and oriented to person, place, and time.  Skin: Skin is warm and dry. She is not diaphoretic.  Psychiatric: She has a normal mood and affect. Her behavior is normal.    ED Course  Procedures (including critical care time) Labs Review Labs Reviewed - No data to display  Imaging Review No results found.   EKG Interpretation None      MDM   Final diagnoses:  Chronic  knee pain, right  History of hypertension   Patient presents with chronic knee pain, no recent injury. Patient requesting or so referral, work note for today, to start on antihypertensive. Discussed following up with PCP for antihypertensive therapy. Chronic knee pain no new injury, no obvious deformity or signs of infected joint, plan to treat for pain and referral given. Discussed treatment plan with the patient. Return precautions given. Reports understanding and no other concerns at this time.  Patient is stable for discharge at this time.  Meds given in ED:  Medications - No data to display  Discharge Medication List as of 06/12/2014  6:46 AM    START taking these medications   Details  naproxen (NAPROSYN) 500 MG tablet Take 1 tablet (500 mg total) by mouth 2 (two) times daily., Starting 06/12/2014, Until Discontinued, Print    traMADol (ULTRAM) 50 MG tablet Take 1 tablet (50 mg total) by mouth every 6 (six) hours as needed., Starting 06/12/2014, Until Discontinued, Print            Lorrine Kin, PA-C 06/12/14 [phone]

## 2014-06-17 ENCOUNTER — Ambulatory Visit: Payer: Self-pay | Admitting: Internal Medicine

## 2014-06-22 ENCOUNTER — Ambulatory Visit: Payer: Medicaid Other | Attending: Internal Medicine | Admitting: Internal Medicine

## 2014-06-22 ENCOUNTER — Encounter: Payer: Self-pay | Admitting: Internal Medicine

## 2014-06-22 VITALS — BP 169/102 | HR 82 | Temp 98.1°F | Resp 18 | Ht 66.0 in | Wt 320.0 lb

## 2014-06-22 DIAGNOSIS — F17200 Nicotine dependence, unspecified, uncomplicated: Secondary | ICD-10-CM | POA: Insufficient documentation

## 2014-06-22 DIAGNOSIS — I10 Essential (primary) hypertension: Secondary | ICD-10-CM | POA: Insufficient documentation

## 2014-06-22 DIAGNOSIS — Z888 Allergy status to other drugs, medicaments and biological substances status: Secondary | ICD-10-CM | POA: Insufficient documentation

## 2014-06-22 DIAGNOSIS — M25569 Pain in unspecified knee: Secondary | ICD-10-CM | POA: Diagnosis present

## 2014-06-22 DIAGNOSIS — M25562 Pain in left knee: Secondary | ICD-10-CM

## 2014-06-22 DIAGNOSIS — M25561 Pain in right knee: Secondary | ICD-10-CM

## 2014-06-22 MED ORDER — VALSARTAN-HYDROCHLOROTHIAZIDE 160-25 MG PO TABS: 1.0000 | ORAL_TABLET | Freq: Every day | ORAL | Status: DC

## 2014-06-22 NOTE — Progress Notes (Signed)
Pt here per HFU- chronic bilat knee pain with ankle swelling  States she was seen by Cassie Freer until January due to no insurance Pt was told she needs a knee replacement Need ortho referral/in process of getting disability

## 2014-06-22 NOTE — Progress Notes (Signed)
Patient ID: Kathleen [name], female   DOB: 1961/02/26, 53 y.o.   MRN: [id number]   CC: Followup  HPI: Patient is 53 year old female who comes to clinic with main concern of persistent bilateral knee pain, throbbing, 5/10 in severity, nonradiating, worse with ambulation and somewhat improved with rest, no specific alleviating or aggravating factors. She was told in the past she will eventually need knee replacement but has lost her insurance and currently requires renewal of her insurance. She would like to have a referral to orthopedic specialist for further evaluation. She also wants [place] to consider replacing her blood pressure medicine. She denies chest pain and shortness of breath, no recent sicknesses or hospitalizations, no bone or urinary concerns. Patient also denies any specific focal neurological symptoms.  Allergies  Allergen Reactions  . Hydrocodone Itching   Past Medical History  Diagnosis Date  . Obesity   . Hypertension     NO MED MD TO GIVE RX   Current Outpatient Prescriptions on File Prior to Visit  Medication Sig Dispense Refill  . naproxen (NAPROSYN) 500 MG tablet Take 1 tablet (500 mg total) by mouth 2 (two) times daily.  30 tablet  0  . traMADol (ULTRAM) 50 MG tablet Take 1 tablet (50 mg total) by mouth every 6 (six) hours as needed.  5 tablet  0   No current facility-administered medications on file prior to visit.   No pertinent family medical history History   Social History  . Marital Status: Widowed    Spouse Name: N/A    Number of Children: N/A  . Years of Education: N/A   Occupational History  . Not on file.   Social History Main Topics  . Smoking status: Current Some Day Smoker  . Smokeless tobacco: Never Used  . Alcohol Use: 0.6 oz/week    1 Glasses of wine per week     Comment: DAILY ALCOHOL   . Drug Use: No  . Sexual Activity: Yes    Birth Control/ Protection: None   Other Topics Concern  . Not on file   Social History Narrative  . No  narrative on file    Review of Systems  Constitutional: Negative for fever, chills, diaphoresis, activity change, appetite change and fatigue.  HENT: Negative for ear pain, nosebleeds, congestion, facial swelling, rhinorrhea, neck pain, neck stiffness and ear discharge.   Eyes: Negative for pain, discharge, redness, itching and visual disturbance.  Respiratory: Negative for cough, choking, chest tightness, shortness of breath, wheezing and stridor.   Cardiovascular: Negative for chest pain, palpitations and leg swelling.  Gastrointestinal: Negative for abdominal distention.  Genitourinary: Negative for dysuria, urgency, frequency, hematuria, flank pain, decreased urine volume, difficulty urinating and dyspareunia.  Musculoskeletal: Negative for back pain, joint swelling.  Neurological: Negative for dizziness, tremors, seizures, syncope, facial asymmetry, speech difficulty, weakness, light-headedness, numbness and headaches.  Hematological: Negative for adenopathy. Does not bruise/bleed easily.  Psychiatric/Behavioral: Negative for hallucinations, behavioral problems, confusion, dysphoric mood, decreased concentration and agitation.    Objective:   Filed Vitals:   06/22/14 0945  BP: 169/102  Pulse: 82  Temp: 98.1 F (36.7 C)  Resp: 18    Physical Exam  Constitutional: Appears well-developed and well-nourished. No distress.  CVS: RRR, S1/S2 +, no murmurs, no gallops, no carotid bruit.  Pulmonary: Effort and breath sounds normal, no stridor, rhonchi, wheezes, rales.  Abdominal: Soft. BS +,  no distension, tenderness, rebound or guarding.  Musculoskeletal: Normal range of motion. No edema and no tenderness.  significant tenderness in the medial and lateral aspects of bilateral knees, no erythema and no effusion noted  Lymphadenopathy: No lymphadenopathy noted, cervical, inguinal.  Lab Results  Component Value Date   WBC 8.3 04/11/2014   HGB 12.3 04/11/2014   HCT 39.8 04/11/2014    MCV 82.6 04/11/2014   PLT 179 04/11/2014   Lab Results  Component Value Date   CREATININE 0.65 04/11/2014   BUN 11 04/11/2014   NA 140 04/11/2014   K 4.1 04/11/2014   CL 103 04/11/2014   CO2 24 04/11/2014    No results found for this basename: HGBA1C   Lipid Panel     Component Value Date/Time   CHOL 170 03/11/2013 1151   TRIG 98 03/11/2013 1151   HDL 64 03/11/2013 1151   CHOLHDL 2.7 03/11/2013 1151   VLDL 20 03/11/2013 1151   LDLCALC 86 03/11/2013 1151       Assessment and plan:   Bilateral knee pain - Likely secondary to progressive degenerative joint disease, osteoarthritis - Certainly worse given morbid obesity - We have discussed weight loss, I have provided recommendations on dietary changes - I advised patient to continue taking Aleve as needed - Referral to orthopedic specialists provide Hypertension - Patient wanted blood pressure medicine to be changed to losartan and she was tolerating medicine better in the past - Patient advised to check blood pressure regularly and to call us back if the numbers are persistently higher than 140/90 for that we can readjust the medical regimen accordingly.

## 2014-06-22 NOTE — Patient Instructions (Signed)

## 2014-06-30 ENCOUNTER — Encounter: Payer: Self-pay | Admitting: Sports Medicine

## 2014-06-30 ENCOUNTER — Ambulatory Visit (INDEPENDENT_AMBULATORY_CARE_PROVIDER_SITE_OTHER): Payer: Self-pay | Admitting: Sports Medicine

## 2014-06-30 VITALS — BP 146/110 | Ht 66.0 in | Wt 316.0 lb

## 2014-06-30 DIAGNOSIS — M170 Bilateral primary osteoarthritis of knee: Secondary | ICD-10-CM

## 2014-06-30 DIAGNOSIS — M25562 Pain in left knee: Principal | ICD-10-CM

## 2014-06-30 DIAGNOSIS — M25569 Pain in unspecified knee: Secondary | ICD-10-CM

## 2014-06-30 DIAGNOSIS — M25561 Pain in right knee: Secondary | ICD-10-CM | POA: Insufficient documentation

## 2014-06-30 DIAGNOSIS — M1710 Unilateral primary osteoarthritis, unspecified knee: Secondary | ICD-10-CM

## 2014-06-30 MED ORDER — MELOXICAM 15 MG PO TABS: 15.0000 mg | ORAL_TABLET | Freq: Every day | ORAL | Status: DC

## 2014-06-30 MED ORDER — TRAMADOL HCL 50 MG PO TABS: 100.0000 mg | ORAL_TABLET | Freq: Two times a day (BID) | ORAL | Status: DC | PRN

## 2014-06-30 NOTE — Assessment & Plan Note (Addendum)
Likely OA; will obtain medical records from [facility] - Minimal benefit from previous steroid injections - Would likely benefit from bilateral knee replacement due to failed medical therapy, however she is currently without insurance and working on getting disability - Discontinued Aleve and naproxen due to lower extremity swelling; Mobic 15 mg qd - Refilled Ultram 100 mg every 12 hours when necessary # 60 - Followup with PCP for ongoing pain management

## 2014-06-30 NOTE — Progress Notes (Signed)
  Kathleen [name] - 53 y.o. female MRN [id number]  Date of birth: [birth year]    SUBJECTIVE:     [name] comes in today for evaluation of bilateral knee pain.  He reports being previously followed by orthopedics (Dr. Rip Harbour) for bilateral knee OA where she received multiple steroid injections with minimal benefit, and was told she would need bilateral knee replacement.  She is unable to followup with orthopedics at this time due to an outstanding debt, and unable to proceed with knee replacements to to lack of insurance.  She reports taking 9-12 on Aleve daily for pain, and had minimal pain improvement with Ultram 50 mg.  She reports being educated on the benefits of exercise and weight loss for knee pain, however says she is unable to walk due to the pain.   ROS:     No erythema, warmth, fevers, chills, night sweats  PERTINENT  PMH / PSH FH / / SH:  Past Medical, Surgical, Social, and Family History Reviewed & Updated in the EMR.  Pertinent findings include:   Hypertension  OBJECTIVE: BP 146/110  Ht 5\' 6"  (1.676 m)  Wt 316 lb (143.337 kg)  BMI 51.03 kg/m2  LMP 11/13/2011  Physical Exam:  Vital signs are reviewed.  Bilateral Knees: Normal to inspection with no erythema or effusion or obvious bony abnormalities. Palpation normal with no warmth, joint line tenderness, patellar tenderness, or condyle tenderness. ROM full in flexion and extension Ligaments with solid consistent endpoints including ACL, PCL, LCL, MCL. Painful patellar compression Patellar glide with moderate crepitus. Patellar and quadriceps tendons unremarkable. Hamstring and quadriceps strength is normal.  X-rays of each knee from 2013 are reviewed. They're non-standing films. She has degenerative changes in both knees. More recent x-rays were done at [facility]. We are going to request those records.  ASSESSMENT & PLAN:  See problem based charting & AVS for pt instructions.

## 2014-07-01 ENCOUNTER — Other Ambulatory Visit: Payer: Self-pay | Admitting: *Deleted

## 2014-07-01 ENCOUNTER — Telehealth: Payer: Self-pay | Admitting: *Deleted

## 2014-07-01 DIAGNOSIS — M179 Osteoarthritis of knee, unspecified: Secondary | ICD-10-CM | POA: Insufficient documentation

## 2014-07-01 DIAGNOSIS — M1710 Unilateral primary osteoarthritis, unspecified knee: Secondary | ICD-10-CM | POA: Insufficient documentation

## 2014-07-05 NOTE — Telephone Encounter (Signed)
Called pt and said to stop taking Tramadol and only take Mobic, to rule out the source of reaction.

## 2014-08-25 ENCOUNTER — Other Ambulatory Visit: Payer: Self-pay | Admitting: *Deleted

## 2014-08-25 MED ORDER — MELOXICAM 15 MG PO TABS: 15.0000 mg | ORAL_TABLET | Freq: Every day | ORAL | Status: DC

## 2014-08-27 ENCOUNTER — Ambulatory Visit
Admission: RE | Admit: 2014-08-27 | Discharge: 2014-08-27 | Disposition: A | Discharge status: Home / Self Care | Payer: No Typology Code available for payment source | Source: Ambulatory Visit | Attending: Sports Medicine | Admitting: Sports Medicine

## 2014-08-27 ENCOUNTER — Ambulatory Visit (INDEPENDENT_AMBULATORY_CARE_PROVIDER_SITE_OTHER): Payer: Medicaid Other | Admitting: Sports Medicine

## 2014-08-27 ENCOUNTER — Telehealth: Payer: Self-pay | Admitting: Sports Medicine

## 2014-08-27 ENCOUNTER — Other Ambulatory Visit: Payer: Self-pay | Admitting: Sports Medicine

## 2014-08-27 ENCOUNTER — Encounter: Payer: Self-pay | Admitting: Sports Medicine

## 2014-08-27 VITALS — BP 133/108 | HR 100 | Ht 66.0 in | Wt 315.0 lb

## 2014-08-27 DIAGNOSIS — M170 Bilateral primary osteoarthritis of knee: Secondary | ICD-10-CM

## 2014-08-27 DIAGNOSIS — M1710 Unilateral primary osteoarthritis, unspecified knee: Secondary | ICD-10-CM

## 2014-08-27 MED ORDER — DICLOFENAC SODIUM 1 % TD GEL: 4.0000 g | Freq: Four times a day (QID) | TRANSDERMAL | Status: DC

## 2014-08-27 NOTE — Progress Notes (Signed)
  [name] - 53 y.o. female MRN [id number]  Date of birth: [birth year]   SUBJECTIVE:     [name] comes in today for evaluation of bilateral knee pain.  She reports having known bilateral knee OA but does not have insurance to obtain the bilateral total knee replacements she needs. She has received multiple steroid injections with minimal benefit in the past as well, and was told she would need bilateral knee replacement by Ortho office. She was last seen on our office in July and was started on Mobic 15mg  daily which has helped some. She d/c tramadol because of side effects of itching.  She reports being educated on the benefits of exercise and weight loss for knee pain, however says she is unable to walk due to the pain.   ROS:     No erythema, warmth, fevers, chills, night sweats  PERTINENT  PMH / PSH FH / / SH:  Past Medical, Surgical, Social, and Family History Reviewed & Updated in the EMR.  Pertinent findings include:   Hypertension  OBJECTIVE: BP 146/110  Ht 5\' 6"  (1.676 m)  Wt 316 lb (143.337 kg)  BMI 51.03 kg/m2  LMP 11/13/2011  Physical Exam:  Vital signs are reviewed.  Bilateral Knees: Normal to inspection with no erythema or effusion or obvious bony abnormalities. Palpation normal with no warmth, joint line tenderness, patellar tenderness, or condyle tenderness. ROM full in flexion and extension Ligaments with solid consistent endpoints including ACL, PCL, LCL, MCL. Painful patellar compression Bilateral Patellar glide with severe crepitus  Patellar and quadriceps tendons unremarkable. Hamstring and quadriceps strength is normal.  X-rays of each knee from 2013 are reviewed. Repeat bilateral weight bearing x-rays done today A/P, lateral, and sunrise views reveal bilateral left greater than right tricompartment osteoarthritis of the knee. With bone-on-bone within the medial compartment bilaterally, very large osteophytes within the patella groove  ASSESSMENT &  PLAN: Bilateral tricompartment osteoarthritis of the knees  Recommendations: -Recommended repeating her x-rays which revealed progression of her osteoarthritis with bone-on-bone within the medial compartment of bilateral knees -Recommended continuing Mobic and provided patient with a prescription for Voltaren gel for anti-inflammatory control -Offer patient cortisone injection to temporize her pain although these have not been effective in the past. Patient refused injection today. -Because the patient has known osteoarthritis significant enough to require total knee replacement without insurance she is unable to obtain a surgery. At this point in time there is nothing more our office can offer and recommended to the patient should follow up with PCP for referral to pain management.

## 2014-08-27 NOTE — Telephone Encounter (Signed)
Spoke with patient over the phone this evening notified her that the results of her bilateral 4 view knee x-rays reveal severe tricompartment osteoarthritis, with bone-on-bone medial compartment. Advised her that at this point in time total joint replacement would be the most beneficial intervention. Advised patient to look for alternative options for insurance. Patient verbalized understanding and was agreeable with our discussion

## 2014-09-22 ENCOUNTER — Telehealth: Payer: Self-pay

## 2014-09-22 NOTE — Telephone Encounter (Signed)
Patient has not yet brought in proof of income for [place] application. Called patient who states she is still interested. States she will bring in proof of income/housing letter after she meets with [employer] housing authority (planned meeting November 5th). Informed patient this will be OK.

## 2014-11-08 ENCOUNTER — Ambulatory Visit (HOSPITAL_COMMUNITY)
Admission: RE | Admit: 2014-11-08 | Discharge: 2014-11-08 | Disposition: A | Discharge status: Home / Self Care | Payer: Medicaid Other | Source: Ambulatory Visit | Attending: Internal Medicine | Admitting: Internal Medicine

## 2014-11-08 ENCOUNTER — Ambulatory Visit: Payer: Medicaid Other | Attending: Internal Medicine | Admitting: Internal Medicine

## 2014-11-08 ENCOUNTER — Other Ambulatory Visit: Payer: Self-pay

## 2014-11-08 VITALS — BP 184/100 | HR 72 | Temp 98.1°F | Resp 20

## 2014-11-08 DIAGNOSIS — Z833 Family history of diabetes mellitus: Secondary | ICD-10-CM | POA: Insufficient documentation

## 2014-11-08 DIAGNOSIS — E669 Obesity, unspecified: Secondary | ICD-10-CM | POA: Diagnosis not present

## 2014-11-08 DIAGNOSIS — R0789 Other chest pain: Secondary | ICD-10-CM | POA: Diagnosis present

## 2014-11-08 DIAGNOSIS — H578 Other specified disorders of eye and adnexa: Secondary | ICD-10-CM | POA: Diagnosis not present

## 2014-11-08 DIAGNOSIS — I10 Essential (primary) hypertension: Secondary | ICD-10-CM | POA: Insufficient documentation

## 2014-11-08 DIAGNOSIS — F1099 Alcohol use, unspecified with unspecified alcohol-induced disorder: Secondary | ICD-10-CM | POA: Insufficient documentation

## 2014-11-08 DIAGNOSIS — H5789 Other specified disorders of eye and adnexa: Secondary | ICD-10-CM

## 2014-11-08 DIAGNOSIS — Z8249 Family history of ischemic heart disease and other diseases of the circulatory system: Secondary | ICD-10-CM | POA: Insufficient documentation

## 2014-11-08 DIAGNOSIS — F17210 Nicotine dependence, cigarettes, uncomplicated: Secondary | ICD-10-CM | POA: Insufficient documentation

## 2014-11-08 MED ORDER — HYDROCHLOROTHIAZIDE 25 MG PO TABS: 25.0000 mg | ORAL_TABLET | Freq: Every day | ORAL | Status: DC

## 2014-11-08 MED ORDER — CLONIDINE HCL 0.1 MG PO TABS
0.2000 mg | ORAL_TABLET | Freq: Once | ORAL | Status: AC
  Administered 2014-11-08: 0.2 mg via ORAL

## 2014-11-08 MED ORDER — LORATADINE 10 MG PO TABS: 10.0000 mg | ORAL_TABLET | Freq: Every day | ORAL | Status: DC

## 2014-11-08 MED ORDER — VALSARTAN 160 MG PO TABS: 160.0000 mg | ORAL_TABLET | Freq: Every day | ORAL | Status: DC

## 2014-11-08 NOTE — Progress Notes (Signed)
Patient walked in c/o 3 day hx left eye pain, swelling, itching States wakes in AM with left eye shut with white crust Notices white, milky drainage throughout day Patient denies blurry vision Patient states ran out of diovan 2 months ago States smoking 2-3 cigs/day  BP 160/122 automatic left arm with large cuff Rechecked manually 176/110 Patient denies chest pain but states had chest pressure earlier today C/o headache rates 5/10 at present  Clonidine 0.2 mg given po per Medical Director  ECG done

## 2014-11-08 NOTE — Progress Notes (Signed)
Patient ID: Kathleen [name], female   DOB: 1961-02-26, 53 y.o.   MRN: [id number] . CC: Hypertension  HPI:  Kathleen [name] is a 53 y.o. female with a past medical history of hypertension and obesity that presents today for a follow up visit.  Patient presented today for a walk-in due to eye pain.  She was found at that time to have severely elevated pressures.  She was given 0.2 mg of Clonidine which only lowered her pressure slightly.  She reports some chest pressure, headache, and eye pain.  Patient was previously on diovan but has not taken in 2 months due to cost.  Allergies  Allergen Reactions  . Hydrocodone Itching  . Lisinopril Cough  . Tramadol Itching   Past Medical History  Diagnosis Date  . Obesity   . Hypertension     NO MED MD TO GIVE RX   Current Outpatient Prescriptions on File Prior to Visit  Medication Sig Dispense Refill  . diclofenac sodium (VOLTAREN) 1 % GEL Apply 4 g topically 4 (four) times daily. 100 g 3  . meloxicam (MOBIC) 15 MG tablet Take 1 tablet (15 mg total) by mouth daily. (Patient not taking: Reported on 11/08/2014) 30 tablet 1  . valsartan-hydrochlorothiazide (DIOVAN HCT) 160-25 MG per tablet Take 1 tablet by mouth daily. (Patient not taking: Reported on 11/08/2014) 30 tablet 3 and   No current facility-administered medications on file prior to visit.   Family History  Problem Relation Age of Onset  . Hypertension Father   . Diabetes Father    History   Social History  . Marital Status: Widowed    Spouse Name: N/A    Number of Children: N/A  . Years of Education: N/A   Occupational History  . Not on file.   Social History Main Topics  . Smoking status: Current Some Day Smoker  . Smokeless tobacco: Never Used     Comment: Smoking 2-3 cigs/day  . Alcohol Use: 0.6 oz/week    1 Glasses of wine per week     Comment: DAILY ALCOHOL   . Drug Use: No  . Sexual Activity: Yes    Birth Control/ Protection: None   Other Topics Concern  . Not on  file   Social History Narrative    Review of Systems  Eyes: Positive for pain.  All other systems reviewed and are negative.  .    Objective:   Filed Vitals:   11/08/14 1530  BP: 184/100  Pulse: 72  Temp:   Resp:     Physical Exam  Constitutional: She is oriented to person, place, and time.  Eyes: Conjunctivae and EOM are normal. Pupils are equal, round, and reactive to light.  Neck: Normal range of motion. Neck supple. No JVD present.  Cardiovascular: Normal rate, regular rhythm and normal heart sounds.   Pulmonary/Chest: Effort normal and breath sounds normal.  Abdominal: Soft. Bowel sounds are normal.  Musculoskeletal: She exhibits no edema.  Neurological: She is alert and oriented to person, place, and time.  Skin: Skin is warm and dry.     Lab Results  Component Value Date   WBC 8.3 04/11/2014   HGB 12.3 04/11/2014   HCT 39.8 04/11/2014   MCV 82.6 04/11/2014   PLT 179 04/11/2014   Lab Results  Component Value Date   CREATININE 0.65 04/11/2014   BUN 11 04/11/2014   NA 140 04/11/2014   K 4.1 04/11/2014   CL 103 04/11/2014   CO2 24  04/11/2014    No results found for: HGBA1C Lipid Panel     Component Value Date/Time   CHOL 170 03/11/2013 1151   TRIG 98 03/11/2013 1151   HDL 64 03/11/2013 1151   CHOLHDL 2.7 03/11/2013 1151   VLDL 20 03/11/2013 1151   LDLCALC 86 03/11/2013 1151       Assessment and plan:   Jorge was seen today for eye problem.  Diagnoses and associated orders for this visit:  Malignant hypertension - cloNIDine (CATAPRES) tablet 0.2 mg; Take 2 tablets (0.2 mg total) by mouth once. - Begin hydrochlorothiazide (HYDRODIURIL) 25 MG tablet; Take 1 tablet (25 mg total) by mouth daily. - Begin valsartan (DIOVAN) 160 MG tablet; Take 1 tablet (160 mg total) by mouth daily.  Explained to patient diet modifications that may contribute to elevated BP. Patient will avoid foods that are high in sodium such as  canned soups and vegetables,  tomato juice, commercial baked goods, commercially prepared frozen or canned entrees and sauces. Avoid salty snacks, added salt when cooking, and substituting for low sodium herbs or spices Explained exercise regimen of cardio at least three times weekly to help lower BP and cholesterol  Eye swelling - loratadine (CLARITIN) 10 MG tablet; Take 1 tablet (10 mg total) by mouth daily.   Return in about 2 weeks (around 11/22/2014) for Nurse Visit-BP check and soon physical.        Chari Manning, [facility] [phone] 11/08/2014, 3:55 PM

## 2014-11-08 NOTE — Patient Instructions (Signed)
Hypertension Hypertension, commonly called high blood pressure, is when the force of blood pumping through your arteries is too strong. Your arteries are the blood vessels that carry blood from your heart throughout your body. A blood pressure reading consists of a higher number over a lower number, such as 110/72. The higher number (systolic) is the pressure inside your arteries when your heart pumps. The lower number (diastolic) is the pressure inside your arteries when your heart relaxes. Ideally you want your blood pressure below 120/80. Hypertension forces your heart to work harder to pump blood. Your arteries may become narrow or stiff. Having hypertension puts you at risk for heart disease, stroke, and other problems.  RISK FACTORS Some risk factors for high blood pressure are controllable. Others are not.  Risk factors you cannot control include:   Race. You may be at higher risk if you are African American.  Age. Risk increases with age.  Gender. Men are at higher risk than women before age 45 years. After age 65, women are at higher risk than men. Risk factors you can control include:  Not getting enough exercise or physical activity.  Being overweight.  Getting too much fat, sugar, calories, or salt in your diet.  Drinking too much alcohol. SIGNS AND SYMPTOMS Hypertension does not usually cause signs or symptoms. Extremely high blood pressure (hypertensive crisis) may cause headache, anxiety, shortness of breath, and nosebleed. DIAGNOSIS  To check if you have hypertension, your health care provider will measure your blood pressure while you are seated, with your arm held at the level of your heart. It should be measured at least twice using the same arm. Certain conditions can cause a difference in blood pressure between your right and left arms. A blood pressure reading that is higher than normal on one occasion does not mean that you need treatment. If one blood pressure reading  is high, ask your health care provider about having it checked again. TREATMENT  Treating high blood pressure includes making lifestyle changes and possibly taking medicine. Living a healthy lifestyle can help lower high blood pressure. You may need to change some of your habits. Lifestyle changes may include:  Following the DASH diet. This diet is high in fruits, vegetables, and whole grains. It is low in salt, red meat, and added sugars.  Getting at least 2 hours of brisk physical activity every week.  Losing weight if necessary.  Not smoking.  Limiting alcoholic beverages.  Learning ways to reduce stress. If lifestyle changes are not enough to get your blood pressure under control, your health care provider may prescribe medicine. You may need to take more than one. Work closely with your health care provider to understand the risks and benefits. HOME CARE INSTRUCTIONS  Have your blood pressure rechecked as directed by your health care provider.   Take medicines only as directed by your health care provider. Follow the directions carefully. Blood pressure medicines must be taken as prescribed. The medicine does not work as well when you skip doses. Skipping doses also puts you at risk for problems.   Do not smoke.   Monitor your blood pressure at home as directed by your health care provider. SEEK MEDICAL CARE IF:   You think you are having a reaction to medicines taken.  You have recurrent headaches or feel dizzy.  You have swelling in your ankles.  You have trouble with your vision. SEEK IMMEDIATE MEDICAL CARE IF:  You develop a severe headache or confusion.    You have unusual weakness, numbness, or feel faint.  You have severe chest or abdominal pain.  You vomit repeatedly.  You have trouble breathing. MAKE SURE YOU:   Understand these instructions.  Will watch your condition.  Will get help right away if you are not doing well or get worse. Document  Released: 11/19/2005 Document Revised: 04/05/2014 Document Reviewed: 09/11/2013 Exit[employer] Patient Information 2015 [employer]. This information is not intended to replace advice given to you by your health care provider. Make sure you discuss any questions you have with your health care provider. DASH Eating Plan DASH stands for "Dietary Approaches to Stop Hypertension." The DASH eating plan is a healthy eating plan that has been shown to reduce high blood pressure (hypertension). Additional health benefits may include reducing the risk of type 2 diabetes mellitus, heart disease, and stroke. The DASH eating plan may also help with weight loss. WHAT DO I NEED TO KNOW ABOUT THE DASH EATING PLAN? For the DASH eating plan, you will follow these general guidelines:  Choose foods with a percent daily value for sodium of less than 5% (as listed on the food label).  Use salt-free seasonings or herbs instead of table salt or sea salt.  Check with your health care provider or pharmacist before using salt substitutes.  Eat lower-sodium products, often labeled as "lower sodium" or "no salt added."  Eat fresh foods.  Eat more vegetables, fruits, and low-fat dairy products.  Choose whole grains. Look for the word "whole" as the first word in the ingredient list.  Choose fish and skinless chicken or turkey more often than red meat. Limit fish, poultry, and meat to 6 oz (170 g) each day.  Limit sweets, desserts, sugars, and sugary drinks.  Choose heart-healthy fats.  Limit cheese to 1 oz (28 g) per day.  Eat more home-cooked food and less restaurant, buffet, and fast food.  Limit fried foods.  Cook foods using methods other than frying.  Limit canned vegetables. If you do use them, rinse them well to decrease the sodium.  When eating at a restaurant, ask that your food be prepared with less salt, or no salt if possible. WHAT FOODS CAN I EAT? Seek help from a dietitian for individual  calorie needs. Grains Whole grain or whole wheat bread. Brown rice. Whole grain or whole wheat pasta. Quinoa, bulgur, and whole grain cereals. Low-sodium cereals. Corn or whole wheat flour tortillas. Whole grain cornbread. Whole grain crackers. Low-sodium crackers. Vegetables Fresh or frozen vegetables (raw, steamed, roasted, or grilled). Low-sodium or reduced-sodium tomato and vegetable juices. Low-sodium or reduced-sodium tomato sauce and paste. Low-sodium or reduced-sodium canned vegetables.  Fruits All fresh, canned (in natural juice), or frozen fruits. Meat and Other Protein Products Ground beef (85% or leaner), grass-fed beef, or beef trimmed of fat. Skinless chicken or turkey. Ground chicken or turkey. Pork trimmed of fat. All fish and seafood. Eggs. Dried beans, peas, or lentils. Unsalted nuts and seeds. Unsalted canned beans. Dairy Low-fat dairy products, such as skim or 1% milk, 2% or reduced-fat cheeses, low-fat ricotta or cottage cheese, or plain low-fat yogurt. Low-sodium or reduced-sodium cheeses. Fats and Oils Tub margarines without trans fats. Light or reduced-fat mayonnaise and salad dressings (reduced sodium). Avocado. Safflower, olive, or canola oils. Natural peanut or almond butter. Other Unsalted popcorn and pretzels. The items listed above may not be a complete list of recommended foods or beverages. Contact your dietitian for more options. WHAT FOODS ARE NOT RECOMMENDED? Grains White bread.   White pasta. White rice. Refined cornbread. Bagels and croissants. Crackers that contain trans fat. Vegetables Creamed or fried vegetables. Vegetables in a cheese sauce. Regular canned vegetables. Regular canned tomato sauce and paste. Regular tomato and vegetable juices. Fruits Dried fruits. Canned fruit in light or heavy syrup. Fruit juice. Meat and Other Protein Products Fatty cuts of meat. Ribs, chicken wings, bacon, sausage, bologna, salami, chitterlings, fatback, hot dogs,  bratwurst, and packaged luncheon meats. Salted nuts and seeds. Canned beans with salt. Dairy Whole or 2% milk, cream, half-and-half, and cream cheese. Whole-fat or sweetened yogurt. Full-fat cheeses or blue cheese. Nondairy creamers and whipped toppings. Processed cheese, cheese spreads, or cheese curds. Condiments Onion and garlic salt, seasoned salt, table salt, and sea salt. Canned and packaged gravies. Worcestershire sauce. Tartar sauce. Barbecue sauce. Teriyaki sauce. Soy sauce, including reduced sodium. Steak sauce. Fish sauce. Oyster sauce. Cocktail sauce. Horseradish. Ketchup and mustard. Meat flavorings and tenderizers. Bouillon cubes. Hot sauce. Tabasco sauce. Marinades. Taco seasonings. Relishes. Fats and Oils Butter, stick margarine, lard, shortening, ghee, and bacon fat. Coconut, palm kernel, or palm oils. Regular salad dressings. Other Pickles and olives. Salted popcorn and pretzels. The items listed above may not be a complete list of foods and beverages to avoid. Contact your dietitian for more information. WHERE CAN I FIND MORE INFORMATION? National Heart, [employer], and [employer] Institute: www.nhlbi.nih.gov/health/health-topics/topics/dash/ Document Released: 11/08/2011 Document Revised: 04/05/2014 Document Reviewed: 09/23/2013 Exit[employer] Patient Information 2015 [employer]. This information is not intended to replace advice given to you by your health care provider. Make sure you discuss any questions you have with your health care provider.  

## 2014-11-16 ENCOUNTER — Encounter: Payer: Self-pay | Admitting: Internal Medicine

## 2014-11-22 ENCOUNTER — Encounter: Payer: Self-pay | Admitting: Internal Medicine

## 2015-01-04 ENCOUNTER — Telehealth: Payer: Self-pay | Admitting: Internal Medicine

## 2015-01-04 ENCOUNTER — Other Ambulatory Visit: Payer: Self-pay | Admitting: *Deleted

## 2015-01-04 DIAGNOSIS — I10 Essential (primary) hypertension: Secondary | ICD-10-CM

## 2015-01-04 MED ORDER — LOSARTAN POTASSIUM 50 MG PO TABS: 50.0000 mg | ORAL_TABLET | Freq: Every day | ORAL | Status: DC

## 2015-01-04 NOTE — Telephone Encounter (Signed)
Pt has question for nurse regarding medication. Please f/u with pt.

## 2015-01-05 ENCOUNTER — Other Ambulatory Visit: Payer: Self-pay | Admitting: Internal Medicine

## 2015-01-05 NOTE — Telephone Encounter (Signed)
LVM notifying  Rx Diovan was approved #[id number]

## 2015-03-30 ENCOUNTER — Encounter (HOSPITAL_COMMUNITY): Payer: Self-pay | Admitting: Emergency Medicine

## 2015-03-30 ENCOUNTER — Emergency Department (HOSPITAL_COMMUNITY)
Admission: EM | Admit: 2015-03-30 | Discharge: 2015-03-30 | Disposition: A | Discharge status: Home / Self Care | Payer: Medicaid Other | Source: Home / Self Care | Attending: Family Medicine | Admitting: Family Medicine

## 2015-03-30 ENCOUNTER — Emergency Department (INDEPENDENT_AMBULATORY_CARE_PROVIDER_SITE_OTHER): Payer: Medicaid Other

## 2015-03-30 DIAGNOSIS — R5381 Other malaise: Secondary | ICD-10-CM | POA: Diagnosis not present

## 2015-03-30 DIAGNOSIS — R3129 Other microscopic hematuria: Secondary | ICD-10-CM

## 2015-03-30 DIAGNOSIS — R5383 Other fatigue: Secondary | ICD-10-CM | POA: Diagnosis not present

## 2015-03-30 DIAGNOSIS — R81 Glycosuria: Secondary | ICD-10-CM

## 2015-03-30 DIAGNOSIS — J189 Pneumonia, unspecified organism: Secondary | ICD-10-CM | POA: Diagnosis not present

## 2015-03-30 DIAGNOSIS — R312 Other microscopic hematuria: Secondary | ICD-10-CM

## 2015-03-30 DIAGNOSIS — J9801 Acute bronchospasm: Secondary | ICD-10-CM | POA: Diagnosis not present

## 2015-03-30 DIAGNOSIS — R7989 Other specified abnormal findings of blood chemistry: Secondary | ICD-10-CM

## 2015-03-30 DIAGNOSIS — E860 Dehydration: Secondary | ICD-10-CM

## 2015-03-30 LAB — POCT URINALYSIS DIP (DEVICE)
Glucose, UA: NEGATIVE mg/dL
Leukocytes, UA: NEGATIVE
Nitrite: NEGATIVE
Protein, ur: >=300 mg/dL — AB
Urobilinogen, UA: 2 mg/dL — ABNORMAL HIGH (ref 0.0–1.0)
pH: 5 (ref 5.0–8.0)

## 2015-03-30 LAB — POCT I-STAT, CHEM 8
BUN: 34 mg/dL — ABNORMAL HIGH (ref 6–23)
CHLORIDE: 98 mmol/L (ref 96–112)
Calcium, Ion: 1.37 mmol/L — ABNORMAL HIGH (ref 1.12–1.23)
Creatinine, Ser: 2.4 mg/dL — ABNORMAL HIGH (ref 0.50–1.10)
GLUCOSE: 122 mg/dL — AB (ref 70–99)
HEMATOCRIT: 47 % — AB (ref 36.0–46.0)
HEMOGLOBIN: 16 g/dL — AB (ref 12.0–15.0)
POTASSIUM: 3.6 mmol/L (ref 3.5–5.1)
Sodium: 137 mmol/L (ref 135–145)
TCO2: 28 mmol/L (ref 0–100)

## 2015-03-30 MED ORDER — CEFUROXIME AXETIL 250 MG PO TABS: 250.0000 mg | ORAL_TABLET | Freq: Two times a day (BID) | ORAL | Status: DC

## 2015-03-30 MED ORDER — LIDOCAINE HCL (PF) 1 % IJ SOLN
INTRAMUSCULAR | Status: AC
Start: 2015-03-30 — End: 2015-03-30
  Filled 2015-03-30: qty 5

## 2015-03-30 MED ORDER — ALBUTEROL SULFATE (2.5 MG/3ML) 0.083% IN NEBU
2.5000 mg | INHALATION_SOLUTION | Freq: Once | RESPIRATORY_TRACT | Status: AC
  Administered 2015-03-30: 2.5 mg via RESPIRATORY_TRACT

## 2015-03-30 MED ORDER — CEFTRIAXONE SODIUM 1 G IJ SOLR
1.0000 g | Freq: Once | INTRAMUSCULAR | Status: AC
  Administered 2015-03-30: 1 g via INTRAMUSCULAR

## 2015-03-30 MED ORDER — ALBUTEROL SULFATE HFA 108 (90 BASE) MCG/ACT IN AERS: 2.0000 | INHALATION_SPRAY | RESPIRATORY_TRACT | Status: DC | PRN

## 2015-03-30 MED ORDER — IPRATROPIUM-ALBUTEROL 0.5-2.5 (3) MG/3ML IN SOLN
RESPIRATORY_TRACT | Status: AC
  Filled 2015-03-30: qty 3

## 2015-03-30 MED ORDER — AZITHROMYCIN 250 MG PO TABS: 250.0000 mg | ORAL_TABLET | Freq: Every day | ORAL | Status: DC

## 2015-03-30 MED ORDER — PREDNISONE 20 MG PO TABS: ORAL_TABLET | ORAL | Status: DC

## 2015-03-30 MED ORDER — CEFTRIAXONE SODIUM 1 G IJ SOLR
INTRAMUSCULAR | Status: AC
  Filled 2015-03-30: qty 10

## 2015-03-30 MED ORDER — ALBUTEROL SULFATE (2.5 MG/3ML) 0.083% IN NEBU
INHALATION_SOLUTION | RESPIRATORY_TRACT | Status: AC
  Filled 2015-03-30: qty 3

## 2015-03-30 MED ORDER — IPRATROPIUM-ALBUTEROL 0.5-2.5 (3) MG/3ML IN SOLN
3.0000 mL | Freq: Once | RESPIRATORY_TRACT | Status: AC
  Administered 2015-03-30: 3 mL via RESPIRATORY_TRACT

## 2015-03-30 MED ORDER — SODIUM CHLORIDE 0.9 % IV BOLUS (SEPSIS)
1000.0000 mL | Freq: Once | INTRAVENOUS | Status: AC
  Administered 2015-03-30: 1000 mL via INTRAVENOUS

## 2015-03-30 NOTE — Discharge Instructions (Signed)
Bronchospasm A bronchospasm is when the tubes that carry air in and out of your lungs (airways) spasm or tighten. During a bronchospasm it is hard to breathe. This is because the airways get smaller. A bronchospasm can be triggered by:  Allergies. These may be to animals, pollen, food, or mold.  Infection. This is a common cause of bronchospasm.  Exercise.  Irritants. These include pollution, cigarette smoke, strong odors, aerosol sprays, and paint fumes.  Weather changes.  Stress.  Being emotional. HOME CARE   Always have a plan for getting help. Know when to call your doctor and local emergency services (911 in the [place] Know where you can get emergency care.  Only take medicines as told by your doctor.  If you were prescribed an inhaler or nebulizer machine, ask your doctor how to use it correctly. Always use a spacer with your inhaler if you were given one.  Stay calm during an attack. Try to relax and breathe more slowly.  Control your home environment:  Change your heating and air conditioning filter at least once a month.  Limit your use of fireplaces and wood stoves.  Do not  smoke. Do not  allow smoking in your home.  Avoid perfumes and fragrances.  Get rid of pests (such as roaches and mice) and their droppings.  Throw away plants if you see mold on them.  Keep your house clean and dust free.  Replace carpet with wood, tile, or vinyl flooring. Carpet can trap dander and dust.  Use allergy-proof pillows, mattress covers, and box spring covers.  Wash bed sheets and blankets every week in hot water. Dry them in a dryer.  Use blankets that are made of polyester or cotton.  Wash hands frequently. GET HELP IF:  You have muscle aches.  You have chest pain.  The thick spit you spit or cough up (sputum) changes from clear or white to yellow, green, gray, or bloody.  The thick spit you spit or cough up gets thicker.  There are problems that may be related  to the medicine you are given such as:  A rash.  Itching.  Swelling.  Trouble breathing. GET HELP RIGHT AWAY IF:  You feel you cannot breathe or catch your breath.  You cannot stop coughing.  Your treatment is not helping you breathe better.  You have very bad chest pain. MAKE SURE YOU:   Understand these instructions.  Will watch your condition.  Will get help right away if you are not doing well or get worse. Document Released: 09/16/2009 Document Revised: 11/24/2013 Document Reviewed: 05/12/2013 [facility] Patient Information 2015 [place], [place]. This information is not intended to replace advice given to you by your health care provider. Make sure you discuss any questions you have with your health care provider.  Dehydration, Adult Dehydration is when you lose more fluids from the body than you take in. Vital organs like the kidneys, brain, and heart cannot function without a proper amount of fluids and salt. Any loss of fluids from the body can cause dehydration.  CAUSES   Vomiting.  Diarrhea.  Excessive sweating.  Excessive urine output.  Fever. SYMPTOMS  Mild dehydration  Thirst.  Dry lips.  Slightly dry mouth. Moderate dehydration  Very dry mouth.  Sunken eyes.  Skin does not bounce back quickly when lightly pinched and released.  Dark urine and decreased urine production.  Decreased tear production.  Headache. Severe dehydration  Very dry mouth.  Extreme thirst.  Rapid, weak  pulse (more than 100 beats per minute at rest).  Cold hands and feet.  Not able to sweat in spite of heat and temperature.  Rapid breathing.  Blue lips.  Confusion and lethargy.  Difficulty being awakened.  Minimal urine production.  No tears. DIAGNOSIS  Your caregiver will diagnose dehydration based on your symptoms and your exam. Blood and urine tests will help confirm the diagnosis. The diagnostic evaluation should also identify the cause of  dehydration. TREATMENT  Treatment of mild or moderate dehydration can often be done at home by increasing the amount of fluids that you drink. It is best to drink small amounts of fluid more often. Drinking too much at one time can make vomiting worse. Refer to the home care instructions below. Severe dehydration needs to be treated at the hospital where you will probably be given intravenous (IV) fluids that contain water and electrolytes. HOME CARE INSTRUCTIONS   Ask your caregiver about specific rehydration instructions.  Drink enough fluids to keep your urine clear or pale yellow.  Drink small amounts frequently if you have nausea and vomiting.  Eat as you normally do.  Avoid:  Foods or drinks high in sugar.  Carbonated drinks.  Juice.  Extremely hot or cold fluids.  Drinks with caffeine.  Fatty, greasy foods.  Alcohol.  Tobacco.  Overeating.  Gelatin desserts.  Wash your hands well to avoid spreading bacteria and viruses.  Only take over-the-counter or prescription medicines for pain, discomfort, or fever as directed by your caregiver.  Ask your caregiver if you should continue all prescribed and over-the-counter medicines.  Keep all follow-up appointments with your caregiver. SEEK MEDICAL CARE IF:  You have abdominal pain and it increases or stays in one area (localizes).  You have a rash, stiff neck, or severe headache.  You are irritable, sleepy, or difficult to awaken.  You are weak, dizzy, or extremely thirsty. SEEK IMMEDIATE MEDICAL CARE IF:   You are unable to keep fluids down or you get worse despite treatment.  You have frequent episodes of vomiting or diarrhea.  You have blood or green matter (bile) in your vomit.  You have blood in your stool or your stool looks black and tarry.  You have not urinated in 6 to 8 hours, or you have only urinated a small amount of very dark urine.  You have a fever.  You faint. MAKE SURE YOU:    Understand these instructions.  Will watch your condition.  Will get help right away if you are not doing well or get worse. Document Released: 11/19/2005 Document Revised: 02/11/2012 Document Reviewed: 07/09/2011 [facility] Patient Information 2015 [place], [place]. This information is not intended to replace advice given to you by your health care provider. Make sure you discuss any questions you have with your health care provider.  Fatigue Fatigue is a feeling of tiredness, lack of energy, lack of motivation, or feeling tired all the time. Having enough rest, good nutrition, and reducing stress will normally reduce fatigue. Consult your caregiver if it persists. The nature of your fatigue will help your caregiver to find out its cause. The treatment is based on the cause.  CAUSES  There are many causes for fatigue. Most of the time, fatigue can be traced to one or more of your habits or routines. Most causes fit into one or more of three general areas. They are: Lifestyle problems  Sleep disturbances.  Overwork.  Physical exertion.  Unhealthy habits.  Poor eating habits or eating  disorders.  Alcohol and/or drug use .  Lack of proper nutrition (malnutrition). Psychological problems  Stress and/or anxiety problems.  Depression.  Grief.  Boredom. Medical Problems or Conditions  Anemia.  Pregnancy.  Thyroid gland problems.  Recovery from major surgery.  Continuous pain.  Emphysema or asthma that is not well controlled  Allergic conditions.  Diabetes.  Infections (such as mononucleosis).  Obesity.  Sleep disorders, such as sleep apnea.  Heart failure or other heart-related problems.  Cancer.  Kidney disease.  Liver disease.  Effects of certain medicines such as antihistamines, cough and cold remedies, prescription pain medicines, heart and blood pressure medicines, drugs used for treatment of cancer, and some antidepressants. SYMPTOMS  The  symptoms of fatigue include:   Lack of energy.  Lack of drive (motivation).  Drowsiness.  Feeling of indifference to the surroundings. DIAGNOSIS  The details of how you feel help guide your caregiver in finding out what is causing the fatigue. You will be asked about your present and past health condition. It is important to review all medicines that you take, including prescription and non-prescription items. A thorough exam will be done. You will be questioned about your feelings, habits, and normal lifestyle. Your caregiver may suggest blood tests, urine tests, or other tests to look for common medical causes of fatigue.  TREATMENT  Fatigue is treated by correcting the underlying cause. For example, if you have continuous pain or depression, treating these causes will improve how you feel. Similarly, adjusting the dose of certain medicines will help in reducing fatigue.  HOME CARE INSTRUCTIONS   Try to get the required amount of good sleep every night.  Eat a healthy and nutritious diet, and drink enough water throughout the day.  Practice ways of relaxing (including yoga or meditation).  Exercise regularly.  Make plans to change situations that cause stress. Act on those plans so that stresses decrease over time. Keep your work and personal routine reasonable.  Avoid street drugs and minimize use of alcohol.  Start taking a daily multivitamin after consulting your caregiver. SEEK MEDICAL CARE IF:   You have persistent tiredness, which cannot be accounted for.  You have fever.  You have unintentional weight loss.  You have headaches.  You have disturbed sleep throughout the night.  You are feeling sad.  You have constipation.  You have dry skin.  You have gained weight.  You are taking any new or different medicines that you suspect are causing fatigue.  You are unable to sleep at night.  You develop any unusual swelling of your legs or other parts of your  body. SEEK IMMEDIATE MEDICAL CARE IF:   You are feeling confused.  Your vision is blurred.  You feel faint or pass out.  You develop severe headache.  You develop severe abdominal, pelvic, or back pain.  You develop chest pain, shortness of breath, or an irregular or fast heartbeat.  You are unable to pass a normal amount of urine.  You develop abnormal bleeding such as bleeding from the rectum or you vomit blood.  You have thoughts about harming yourself or committing suicide.  You are worried that you might harm someone else. MAKE SURE YOU:   Understand these instructions.  Will watch your condition.  Will get help right away if you are not doing well or get worse. Document Released: 09/16/2007 Document Revised: 02/11/2012 Document Reviewed: 03/23/2014 [facility] Patient Information 2015 [place], [place]. This information is not intended to replace advice given to  you by your health care provider. Make sure you discuss any questions you have with your health care provider.  How to Use an Inhaler Using your inhaler correctly is very important. Good technique will make sure that the medicine reaches your lungs.  HOW TO USE AN INHALER:  Take the cap off the inhaler.  If this is the first time using your inhaler, you need to prime it. Shake the inhaler for 5 seconds. Release four puffs into the air, away from your face. Ask your doctor for help if you have questions.  Shake the inhaler for 5 seconds.  Turn the inhaler so the bottle is above the mouthpiece.  Put your pointer finger on top of the bottle. Your thumb holds the bottom of the inhaler.  Open your mouth.  Either hold the inhaler away from your mouth (the width of 2 fingers) or place your lips tightly around the mouthpiece. Ask your doctor which way to use your inhaler.  Breathe out as much air as possible.  Breathe in and push down on the bottle 1 time to release the medicine. You will feel the medicine go in  your mouth and throat.  Continue to take a deep breath in very slowly. Try to fill your lungs.  After you have breathed in completely, hold your breath for 10 seconds. This will help the medicine to settle in your lungs. If you cannot hold your breath for 10 seconds, hold it for as long as you can before you breathe out.  Breathe out slowly, through pursed lips. Whistling is an example of pursed lips.  If your doctor has told you to take more than 1 puff, wait at least 15-30 seconds between puffs. This will help you get the best results from your medicine. Do not use the inhaler more than your doctor tells you to.  Put the cap back on the inhaler.  Follow the directions from your doctor or from the inhaler package about cleaning the inhaler. If you use more than one inhaler, ask your doctor which inhalers to use and what order to use them in. Ask your doctor to help you figure out when you will need to refill your inhaler.  If you use a steroid inhaler, always rinse your mouth with water after your last puff, gargle and spit out the water. Do not swallow the water. GET HELP IF:  The inhaler medicine only partially helps to stop wheezing or shortness of breath.  You are having trouble using your inhaler.  You have some increase in thick spit (phlegm). GET HELP RIGHT AWAY IF:  The inhaler medicine does not help your wheezing or shortness of breath or you have tightness in your chest.  You have dizziness, headaches, or fast heart rate.  You have chills, fever, or night sweats.  You have a large increase of thick spit, or your thick spit is bloody. MAKE SURE YOU:   Understand these instructions.  Will watch your condition.  Will get help right away if you are not doing well or get worse. Document Released: 08/28/2008 Document Revised: 09/09/2013 Document Reviewed: 06/18/2013 [facility] Patient Information 2015 [place], [place]. This information is not intended to replace advice given  to you by your health care provider. Make sure you discuss any questions you have with your health care provider.  Pneumonia Pneumonia is an infection of the lungs.  CAUSES Pneumonia may be caused by bacteria or a virus. Usually, these infections are caused by breathing infectious particles  into the lungs (respiratory tract). SIGNS AND SYMPTOMS   Cough.  Fever.  Chest pain.  Increased rate of breathing.  Wheezing.  Mucus production. DIAGNOSIS  If you have the common symptoms of pneumonia, your health care provider will typically confirm the diagnosis with a chest X-ray. The X-ray will show an abnormality in the lung (pulmonary infiltrate) if you have pneumonia. Other tests of your blood, urine, or sputum may be done to find the specific cause of your pneumonia. Your health care provider may also do tests (blood gases or pulse oximetry) to see how well your lungs are working. TREATMENT  Some forms of pneumonia may be spread to other people when you cough or sneeze. You may be asked to wear a mask before and during your exam. Pneumonia that is caused by bacteria is treated with antibiotic medicine. Pneumonia that is caused by the influenza virus may be treated with an antiviral medicine. Most other viral infections must run their course. These infections will not respond to antibiotics.  HOME CARE INSTRUCTIONS   Cough suppressants may be used if you are losing too much rest. However, coughing protects you by clearing your lungs. You should avoid using cough suppressants if you can.  Your health care provider may have prescribed medicine if he or she thinks your pneumonia is caused by bacteria or influenza. Finish your medicine even if you start to feel better.  Your health care provider may also prescribe an expectorant. This loosens the mucus to be coughed up.  Take medicines only as directed by your health care provider.  Do not smoke. Smoking is a common cause of bronchitis and can  contribute to pneumonia. If you are a smoker and continue to smoke, your cough may last several weeks after your pneumonia has cleared.  A cold steam vaporizer or humidifier in your room or home may help loosen mucus.  Coughing is often worse at night. Sleeping in a semi-upright position in a recliner or using a couple pillows under your head will help with this.  Get rest as you feel it is needed. Your body will usually let you know when you need to rest. PREVENTION A pneumococcal shot (vaccine) is available to prevent a common bacterial cause of pneumonia. This is usually suggested for:  People over 64 years old.  Patients on chemotherapy.  People with chronic lung problems, such as bronchitis or emphysema.  People with immune system problems. If you are over 65 or have a high risk condition, you may receive the pneumococcal vaccine if you have not received it before. In some countries, a routine influenza vaccine is also recommended. This vaccine can help prevent some cases of pneumonia.You may be offered the influenza vaccine as part of your care. If you smoke, it is time to quit. You may receive instructions on how to stop smoking. Your health care provider can provide medicines and counseling to help you quit. SEEK MEDICAL CARE IF: You have a fever. SEEK IMMEDIATE MEDICAL CARE IF:   Your illness becomes worse. This is especially true if you are elderly or weakened from any other disease.  You cannot control your cough with suppressants and are losing sleep.  You begin coughing up blood.  You develop pain which is getting worse or is uncontrolled with medicines.  Any of the symptoms which initially brought you in for treatment are getting worse rather than better.  You develop shortness of breath or chest pain. MAKE SURE YOU:   Understand  these instructions.  Will watch your condition.  Will get help right away if you are not doing well or get worse. Document Released:  11/19/2005 Document Revised: 04/05/2014 Document Reviewed: 02/08/2011 [facility] Patient Information 2015 [place], [place]. This information is not intended to replace advice given to you by your health care provider. Make sure you discuss any questions you have with your health care provider.  Rehydration, Adult Rehydration is the replacement of body fluids lost during dehydration. Dehydration is an extreme loss of body fluids to the point of body function impairment. There are many ways extreme fluid loss can occur, including vomiting, diarrhea, or excess sweating. Recovering from dehydration requires replacing lost fluids, continuing to eat to maintain strength, and avoiding foods and beverages that may contribute to further fluid loss or may increase nausea. HOW TO REHYDRATE In most cases, rehydration involves the replacement of not only fluids but also carbohydrates and basic body salts. Rehydration with an oral rehydration solution is one way to replace essential nutrients lost through dehydration. An oral rehydration solution can be purchased at pharmacies, retail stores, and online. Premixed packets of powder that you combine with water to make a solution are also sold. You can prepare an oral rehydration solution at home by mixing the following ingredients together:    - tsp table salt.   tsp baking soda.   tsp salt substitute containing potassium chloride.  1 tablespoons sugar.  1 L (34 oz) of water. Be sure to use exact measurements. Including too much sugar can make diarrhea worse. Drink -1 cup (120-240 mL) of oral rehydration solution each time you have diarrhea or vomit. If drinking this amount makes your vomiting worse, try drinking smaller amounts more often. For example, drink 1-3 tsp every 5-10 minutes.  A general rule for staying hydrated is to drink 1-2 L of fluid per day. Talk to your caregiver about the specific amount you should be drinking each day. Drink enough fluids to  keep your urine clear or pale yellow. EATING WHEN DEHYDRATED Even if you have had severe sweating or you are having diarrhea, do not stop eating. Many healthy items in a normal diet are okay to continue eating while recovering from dehydration. The following tips can help you to lessen nausea when you eat:  Ask someone else to prepare your food. Cooking smells may worsen nausea.  Eat in a well-ventilated room away from cooking smells.  Sit up when you eat. Avoid lying down until 1-2 hours after eating.  Eat small amounts when you eat.  Eat foods that are easy to digest. These include soft, well-cooked, or mashed foods. FOODS AND BEVERAGES TO AVOID Avoid eating or drinking the following foods and beverages that may increase nausea or further loss of fluid:   Fruit juices with a high sugar content, such as concentrated juices.  Alcohol.  Beverages containing caffeine.  Carbonated drinks. They may cause a lot of gas.  Foods that may cause a lot of gas, such as cabbage, broccoli, and beans.  Fatty, greasy, and fried foods.  Spicy, very salty, and very sweet foods or drinks.  Foods or drinks that are very hot or very cold. Consume food or drinks at or near room temperature.  Foods that need a lot of chewing, such as raw vegetables.  Foods that are sticky or hard to swallow, such as peanut butter. Document Released: 02/11/2012 Document Revised: 08/13/2012 Document Reviewed: 02/11/2012 [facility] Patient Information 2015 [place], [place]. This information is not intended to  replace advice given to you by your health care provider. Make sure you discuss any questions you have with your health care provider. ° °

## 2015-03-30 NOTE — ED Notes (Signed)
C/o fatigued States she has no energy, no strength,  Sob, cough States she has no appetite States it took an hour to get dress States on Saturday she passed out

## 2015-03-30 NOTE — ED Provider Notes (Signed)
CSN: [id number]     Arrival date & time 03/30/15  1403 History   First MD Initiated Contact with Patient 03/30/15 1702     Chief Complaint  Patient presents with  . Fatigue   (Consider location/radiation/quality/duration/timing/severity/associated sxs/prior Treatment) HPI Comments: 54 year old morbidly obese female with a history of hypertension, frequent alcohol consumption and smoking on occasion is complaining of fatigue and malaise, significant decrease in activity, cough, dizziness, lightheadedness, decrease in by mouth intake, wheezing, urinary frequency and undocumented fever for approximately 8 days. 4 days ago she states that she lost consciousness and fell to the floor for about 5 minutes. She states she is so weak is difficult for her to walk from one room to the other. Despite the symptoms she felt it was not necessary for her to see her PCP earlier or call EMS when she had a loss of consciousness.   Past Medical History  Diagnosis Date  . Obesity   . Hypertension     NO MED MD TO GIVE RX   Past Surgical History  Procedure Laterality Date  . Nose surgery      BROKEN   MANY YRS AGO   . Tubal ligation    . Cesarean section    . Carpal tunnel release  01/12/2012    Procedure: CARPAL TUNNEL RELEASE;  Surgeon: Schuyler Amor, MD;  Location: [facility];  Service: Orthopedics;  Laterality: Right;  . Wrist fracture surgery  Feb 2013   Family History  Problem Relation Age of Onset  . Hypertension Father   . Diabetes Father    History  Substance Use Topics  . Smoking status: Current Some Day Smoker  . Smokeless tobacco: Never Used     Comment: Smoking 2-3 cigs/day  . Alcohol Use: 0.6 oz/week    1 Glasses of wine per week     Comment: DAILY ALCOHOL    OB History    No data available     Review of Systems  Constitutional: Positive for fever, activity change, appetite change and fatigue.  HENT: Negative for congestion, ear pain, sore throat and trouble swallowing.   Eyes:  Negative for visual disturbance.  Respiratory: Positive for cough, shortness of breath and wheezing. Negative for apnea and choking.   Cardiovascular: Negative for chest pain and palpitations.  Gastrointestinal: Negative for vomiting, abdominal pain, diarrhea and abdominal distention.  Genitourinary: Positive for frequency. Negative for vaginal bleeding.  Musculoskeletal: Negative for neck pain and neck stiffness.  Neurological: Positive for dizziness, syncope and weakness.  Psychiatric/Behavioral: Positive for confusion.    Allergies  Hydrocodone; Lisinopril; and Tramadol  Home Medications   Prior to Admission medications   Medication Sig Start Date End Date Taking? Authorizing Provider  albuterol (PROVENTIL HFA;VENTOLIN HFA) 108 (90 BASE) MCG/ACT inhaler Inhale 2 puffs into the lungs every 4 (four) hours as needed for wheezing or shortness of breath. 03/30/15   Janne Napoleon, NP  azithromycin (ZITHROMAX) 250 MG tablet Take 1 tablet (250 mg total) by mouth daily. Take first 2 tablets together, then 1 every day until finished. 03/30/15   Janne Napoleon, NP  cefUROXime (CEFTIN) 250 MG tablet Take 1 tablet (250 mg total) by mouth 2 (two) times daily with a meal. 03/30/15   Janne Napoleon, NP  diclofenac sodium (VOLTAREN) 1 % GEL Apply 4 g topically 4 (four) times daily. 08/27/14   Deanna M Didiano, DO  hydrochlorothiazide (HYDRODIURIL) 25 MG tablet Take 1 tablet (25 mg total) by mouth daily. 11/08/14   Mateo Flow  San Morelle, NP  loratadine (CLARITIN) 10 MG tablet Take 1 tablet (10 mg total) by mouth daily. 11/08/14   Lance Bosch, NP  predniSONE (DELTASONE) 20 MG tablet Take 3 tabs po on first day, 2 tabs second day, 2 tabs third day, 1 tab fourth day, 1 tab 5th day. Take with food. 03/30/15   Janne Napoleon, NP  valsartan (DIOVAN) 160 MG tablet Take 1 tablet (160 mg total) by mouth daily. 11/08/14   Lance Bosch, NP   BP 95/51 mmHg  Pulse 86  Temp(Src) 99 F (37.2 C) (Oral)  Resp 20  SpO2 98%  LMP  11/13/2011 Physical Exam  Constitutional: She is oriented to person, place, and time. No distress.  HENT:  Bilateral TMs are normal Oropharynx with some drainage. No erythema. No exudates.  Eyes: Conjunctivae and EOM are normal.  Neck: Normal range of motion.  Cardiovascular: Normal rate, regular rhythm and normal heart sounds.   Pulmonary/Chest: Effort normal. She has wheezes.  Prolonged expiratory phase. Scattered wheezing bilaterally.  Lymphadenopathy:    She has no cervical adenopathy.  Neurological: She is alert and oriented to person, place, and time. She exhibits normal muscle tone.  Skin: Skin is warm and dry. No rash noted. She is not diaphoretic. No erythema.  Nursing note and vitals reviewed.   ED Course  Procedures (including critical care time) Labs Review Labs Reviewed  POCT I-STAT, CHEM 8 - Abnormal; Notable for the following:    BUN 34 (*)    Creatinine, Ser 2.40 (*)    Glucose, Bld 122 (*)    Calcium, Ion 1.37 (*)    Hemoglobin 16.0 (*)    HCT 47.0 (*)    All other components within normal limits  POCT URINALYSIS DIP (DEVICE) - Abnormal; Notable for the following:    Bilirubin Urine MODERATE (*)    Ketones, ur TRACE (*)    Hgb urine dipstick MODERATE (*)    Protein, ur >=300 (*)    Urobilinogen, UA 2.0 (*)    All other components within normal limits    Imaging Review Dg Chest 2 View  03/30/2015   CLINICAL DATA:  54 year old female with a history of weakness and fatigue  EXAM: CHEST - 2 VIEW  COMPARISON:  01/11/2012  FINDINGS: Cardiomediastinal silhouette unchanged in size and contour.  No pleural effusion or pneumothorax  Airspace disease in the retrocardiac region, corresponding to opacity at the posterior base on the lateral view.  No displaced fracture.  Unremarkable appearance of the upper abdomen.  IMPRESSION: Left basilar pneumonia.  Signed,  Dulcy Fanny. Earleen Newport, DO  [facility]  [facility]   Electronically  Signed   By: Corrie Mckusick D.O.   On: 03/30/2015 17:45     MDM   1. Community acquired pneumonia   2. Dehydration   3. Malaise and fatigue   4. Azotemia   5. Bronchospasm   6. Glycosuria   7. Hematuria, microscopic     Post DuoNeb 5 mg 4/2.5 mg patient is breathing better. Few wheezes and improved air movement. IV normal saline 1 L adm Rocephin 1 g IM Ceftin 250 bid Azithromycin pak Albuterol inhaler Prednisone taper Pt st feeling much better at D/C  Janne Napoleon, NP 03/30/15 1929

## 2015-04-26 ENCOUNTER — Other Ambulatory Visit: Payer: Self-pay | Admitting: *Deleted

## 2015-04-26 ENCOUNTER — Encounter: Payer: Self-pay | Admitting: *Deleted

## 2015-04-26 DIAGNOSIS — I10 Essential (primary) hypertension: Secondary | ICD-10-CM

## 2015-04-26 MED ORDER — HYDROCHLOROTHIAZIDE 25 MG PO TABS: 25.0000 mg | ORAL_TABLET | Freq: Every day | ORAL | Status: DC

## 2015-04-26 NOTE — Telephone Encounter (Signed)
error 

## 2015-04-26 NOTE — Telephone Encounter (Signed)
Patient called asking for refill on hydrochlorothiazide.  Patient needs appointment.  Transferred to [place] to make appointment and gave her 1 month supply.

## 2015-05-05 ENCOUNTER — Ambulatory Visit: Payer: Medicaid Other | Admitting: Internal Medicine

## 2015-05-25 ENCOUNTER — Telehealth: Payer: Self-pay | Admitting: Internal Medicine

## 2015-05-25 NOTE — Telephone Encounter (Signed)
Patient called requesting medication refill for valsartan (DIOVAN) 160 MG tablet,hydrochlorothiazide (HYDRODIURIL) 25 MG tablet.  Patient is scheduled to come in 06/30 but needs enough medication to last until then. Please f/u

## 2015-05-25 NOTE — Telephone Encounter (Signed)
Please give 30 days worth

## 2015-05-26 ENCOUNTER — Other Ambulatory Visit: Payer: Self-pay

## 2015-05-26 DIAGNOSIS — I10 Essential (primary) hypertension: Secondary | ICD-10-CM

## 2015-05-26 MED ORDER — VALSARTAN 160 MG PO TABS: 160.0000 mg | ORAL_TABLET | Freq: Every day | ORAL | Status: DC

## 2015-05-26 MED ORDER — HYDROCHLOROTHIAZIDE 25 MG PO TABS: 25.0000 mg | ORAL_TABLET | Freq: Every day | ORAL | Status: DC

## 2015-05-26 NOTE — Telephone Encounter (Signed)
Call patient to let her know we refill her meds for 30 days.

## 2015-06-02 ENCOUNTER — Ambulatory Visit: Payer: Medicaid Other | Admitting: Internal Medicine

## 2015-06-20 ENCOUNTER — Ambulatory Visit: Payer: Medicaid Other | Admitting: Internal Medicine

## 2015-06-23 ENCOUNTER — Ambulatory Visit: Payer: Medicaid Other | Attending: Internal Medicine | Admitting: Internal Medicine

## 2015-06-23 ENCOUNTER — Encounter: Payer: Self-pay | Admitting: Internal Medicine

## 2015-06-23 VITALS — BP 118/79 | HR 71 | Temp 98.0°F | Resp 18 | Ht 66.0 in | Wt 322.2 lb

## 2015-06-23 DIAGNOSIS — M25562 Pain in left knee: Secondary | ICD-10-CM | POA: Diagnosis not present

## 2015-06-23 DIAGNOSIS — Z720 Tobacco use: Secondary | ICD-10-CM | POA: Insufficient documentation

## 2015-06-23 DIAGNOSIS — M25561 Pain in right knee: Secondary | ICD-10-CM

## 2015-06-23 DIAGNOSIS — I10 Essential (primary) hypertension: Secondary | ICD-10-CM | POA: Insufficient documentation

## 2015-06-23 DIAGNOSIS — Z6841 Body Mass Index (BMI) 40.0 and over, adult: Secondary | ICD-10-CM | POA: Diagnosis not present

## 2015-06-23 DIAGNOSIS — R748 Abnormal levels of other serum enzymes: Secondary | ICD-10-CM | POA: Insufficient documentation

## 2015-06-23 DIAGNOSIS — R7989 Other specified abnormal findings of blood chemistry: Secondary | ICD-10-CM

## 2015-06-23 MED ORDER — HYDROCHLOROTHIAZIDE 25 MG PO TABS: 25.0000 mg | ORAL_TABLET | Freq: Every day | ORAL | Status: DC

## 2015-06-23 MED ORDER — VALSARTAN 160 MG PO TABS: 160.0000 mg | ORAL_TABLET | Freq: Every day | ORAL | Status: DC

## 2015-06-23 MED ORDER — CYCLOBENZAPRINE HCL 10 MG PO TABS: 10.0000 mg | ORAL_TABLET | Freq: Two times a day (BID) | ORAL | Status: DC | PRN

## 2015-06-23 NOTE — Patient Instructions (Signed)

## 2015-06-23 NOTE — Progress Notes (Signed)
Patient here for knee pain in both knees and follow up on BP. Patient reports pain today in knees rated at an 8, described as sore. Pain started about 3-4 days ago and is constant. Patient reports swelling in her knees currently. Patient states that she needs a knee replacement. Patient requests a muscle relaxer to help with her knee pain. Patient BP today was 118/79. Patient reports a warm sensation that radiates down her legs and then cools. Patient is wondering what this may be. Patient has taken medications for today. Patient needs a refill on hydrochlorothiazide and valsartan. Patient smokes about 6 cigarettes a week and drinks alcohol daily.

## 2015-06-23 NOTE — Progress Notes (Signed)
Patient ID: Kathleen [name], female   DOB: 19-Dec-1960, 54 y.o.   MRN: [id number]  CC: bilateral knee pain   HPI: Kathleen [name] is a 54 y.o. female here today for a follow up visit.  Patient has past medical history of hypertension and obesity that presents today for a follow up visit.  She reports that she seen a Orthopedics specialist last year and was told that she would require a total knee replacement but she did not have insurance at the time. She reports that she is not ready to get the replacement. She has been using Mobic and Voltaren gel without much relief. She reports that when she takes 2 Aleve and 1 mobic together the pain goes away.  She also complains of a warm sensation down left leg, then gets cool. Tingling in bottom of foot. She is refusing bloodwork today.   Patient has No headache, No chest pain, No abdominal pain - No Nausea, No Cough - SOB.  Allergies  Allergen Reactions  . Hydrocodone Itching  . Lisinopril Cough  . Tramadol Itching   Past Medical History  Diagnosis Date  . Obesity   . Hypertension     NO MED MD TO GIVE RX   Current Outpatient Prescriptions on File Prior to Visit  Medication Sig Dispense Refill  . albuterol (PROVENTIL HFA;VENTOLIN HFA) 108 (90 BASE) MCG/ACT inhaler Inhale 2 puffs into the lungs every 4 (four) hours as needed for wheezing or shortness of breath. 1 Inhaler 0  . diclofenac sodium (VOLTAREN) 1 % GEL Apply 4 g topically 4 (four) times daily. 100 g 3  . hydrochlorothiazide (HYDRODIURIL) 25 MG tablet Take 1 tablet (25 mg total) by mouth daily. 30 tablet 0  . loratadine (CLARITIN) 10 MG tablet Take 1 tablet (10 mg total) by mouth daily. 30 tablet 11  . valsartan (DIOVAN) 160 MG tablet Take 1 tablet (160 mg total) by mouth daily. 30 tablet 3  . azithromycin (ZITHROMAX) 250 MG tablet Take 1 tablet (250 mg total) by mouth daily. Take first 2 tablets together, then 1 every day until finished. (Patient not taking: Reported on 06/23/2015) 6 tablet  0  . cefUROXime (CEFTIN) 250 MG tablet Take 1 tablet (250 mg total) by mouth 2 (two) times daily with a meal. (Patient not taking: Reported on 06/23/2015) 20 tablet 0  . predniSONE (DELTASONE) 20 MG tablet Take 3 tabs po on first day, 2 tabs second day, 2 tabs third day, 1 tab fourth day, 1 tab 5th day. Take with food. (Patient not taking: Reported on 06/23/2015) 9 tablet 0   No current facility-administered medications on file prior to visit.   Family History  Problem Relation Age of Onset  . Hypertension Father   . Diabetes Father    History   Social History  . Marital Status: Widowed    Spouse Name: N/A  . Number of Children: N/A  . Years of Education: N/A   Occupational History  . Not on file.   Social History Main Topics  . Smoking status: Current Some Day Smoker  . Smokeless tobacco: Never Used     Comment: Smoking about 6 cigs/week  . Alcohol Use: Yes     Comment: DAILY ALCOHOL   . Drug Use: No  . Sexual Activity: Yes    Birth Control/ Protection: None   Other Topics Concern  . Not on file   Social History Narrative    Review of Systems: See HPI   Objective:  Filed Vitals:   06/23/15 1002  BP: 118/79  Pulse: 71  Temp: 98 F (36.7 C)  Resp: 18    Physical Exam  Constitutional:  obese  Cardiovascular: Normal rate, regular rhythm and normal heart sounds.   Pulmonary/Chest: Effort normal and breath sounds normal.  Musculoskeletal: She exhibits no tenderness.  Bone on bone scrubbing with bilateral knee ROM      Lab Results  Component Value Date   WBC 8.3 04/11/2014   HGB 16.0* 03/30/2015   HCT 47.0* 03/30/2015   MCV 82.6 04/11/2014   PLT 179 04/11/2014   Lab Results  Component Value Date   CREATININE 2.40* 03/30/2015   BUN 34* 03/30/2015   NA 137 03/30/2015   K 3.6 03/30/2015   CL 98 03/30/2015   CO2 24 04/11/2014    No results found for: HGBA1C Lipid Panel     Component Value Date/Time   CHOL 170 03/11/2013 1151   TRIG 98  03/11/2013 1151   HDL 64 03/11/2013 1151   CHOLHDL 2.7 03/11/2013 1151   VLDL 20 03/11/2013 1151   LDLCALC 86 03/11/2013 1151       Assessment and plan:   [name] was seen today for knee pain and hypertension.  Diagnoses and all orders for this visit:  Malignant hypertension Orders: -     hydrochlorothiazide (HYDRODIURIL) 25 MG tablet; Take 1 tablet (25 mg total) by mouth daily. -     valsartan (DIOVAN) 160 MG tablet; Take 1 tablet (160 mg total) by mouth daily. Patient blood pressure is stable and may continue on current medication.  Education on diet, exercise, and modifiable risk factors discussed. Will obtain appropriate labs as needed. Will follow up in 3-6 months.   Elevated serum creatinine Encouraged patient to get labs today but she refused and said that she will come back at a later time. Explained that she should avoid all NSAID's for now due to elevated creatinine.  Bilateral knee pain Orders: -     cyclobenzaprine (FLEXERIL) 10 MG tablet; Take 1 tablet (10 mg total) by mouth 2 (two) times daily as needed for muscle spasms. Weight loss advised and she will need to see Orthopedics again, States that she will let me know when she is ready for me to place referral.   Morbid obesity Stressed weight loss relating to knee pain and the risk of surgery.    Return in about 3 months (around 09/23/2015) for Hypertension.       Lance Bosch, [facility] [phone] 06/23/2015, 10:23 AM

## 2015-07-20 ENCOUNTER — Other Ambulatory Visit: Payer: Self-pay | Admitting: Internal Medicine

## 2015-07-20 ENCOUNTER — Telehealth: Payer: Self-pay

## 2015-07-20 DIAGNOSIS — E785 Hyperlipidemia, unspecified: Secondary | ICD-10-CM

## 2015-07-20 DIAGNOSIS — R7989 Other specified abnormal findings of blood chemistry: Secondary | ICD-10-CM

## 2015-07-20 NOTE — Telephone Encounter (Signed)
Nurse called patient, patient verified date of birth. Patient aware of need for lab appointment to keep an eye on her kidney function. Patient just had dental surgery on Monday, and can't come now but agrees to make an appointment for future labss. Patient transferred to front office staff to schedule lab appointment.

## 2015-07-20 NOTE — Telephone Encounter (Signed)
Orders have been placed.

## 2015-07-29 ENCOUNTER — Ambulatory Visit: Payer: Medicaid Other | Attending: Internal Medicine

## 2015-07-29 DIAGNOSIS — E785 Hyperlipidemia, unspecified: Secondary | ICD-10-CM

## 2015-07-29 DIAGNOSIS — R7989 Other specified abnormal findings of blood chemistry: Secondary | ICD-10-CM

## 2015-07-29 LAB — COMPLETE METABOLIC PANEL WITH GFR
ALBUMIN: 3.8 g/dL (ref 3.6–5.1)
ALK PHOS: 77 U/L (ref 33–130)
ALT: 21 U/L (ref 6–29)
AST: 16 U/L (ref 10–35)
BUN: 16 mg/dL (ref 7–25)
CALCIUM: 10.3 mg/dL (ref 8.6–10.4)
CO2: 27 mmol/L (ref 20–31)
Chloride: 105 mmol/L (ref 98–110)
Creat: 0.85 mg/dL (ref 0.50–1.05)
GFR, EST NON AFRICAN AMERICAN: 79 mL/min (ref >=60)
Glucose, Bld: 99 mg/dL (ref 65–99)
POTASSIUM: 3.8 mmol/L (ref 3.5–5.3)
Sodium: 139 mmol/L (ref 135–146)
Total Bilirubin: 0.5 mg/dL (ref 0.2–1.2)
Total Protein: 6.7 g/dL (ref 6.1–8.1)

## 2015-07-29 LAB — LIPID PANEL
CHOLESTEROL: 175 mg/dL (ref 125–200)
HDL: 55 mg/dL (ref >=46)
LDL Cholesterol: 102 mg/dL (ref <130)
Total CHOL/HDL Ratio: 3.2 Ratio (ref <=5.0)
Triglycerides: 90 mg/dL (ref <150)
VLDL: 18 mg/dL (ref <30)

## 2015-08-02 ENCOUNTER — Telehealth: Payer: Self-pay

## 2015-08-02 NOTE — Telephone Encounter (Signed)
Spoke with patient and she is aware her kidney function is back to normal and  To watch diet and exercise for her cholesterol

## 2015-08-02 NOTE — Telephone Encounter (Signed)
-----   Message from Lance Bosch, NP sent at 08/02/2015 10:45 AM EDT ----- Kidney function greatly improved, she is back to normal. We will continue to monitor over time. Cholesterol is ok, justly slightly elevated LDL--bad cholesterol. Please advise on diet changes such as breads, pasta, risce, potatoes, fried foods, butters. She needs to begin exercising/walking for 30 minutes at least 3 times per week.

## 2015-12-26 ENCOUNTER — Telehealth: Payer: Self-pay | Admitting: Internal Medicine

## 2015-12-28 ENCOUNTER — Ambulatory Visit: Payer: Medicaid Other | Attending: Internal Medicine | Admitting: Internal Medicine

## 2015-12-28 ENCOUNTER — Encounter: Payer: Self-pay | Admitting: Internal Medicine

## 2015-12-28 VITALS — BP 126/88 | HR 100 | Temp 98.0°F | Resp 16 | Ht 66.0 in | Wt 329.4 lb

## 2015-12-28 DIAGNOSIS — Z6841 Body Mass Index (BMI) 40.0 and over, adult: Secondary | ICD-10-CM | POA: Insufficient documentation

## 2015-12-28 DIAGNOSIS — I10 Essential (primary) hypertension: Secondary | ICD-10-CM | POA: Diagnosis not present

## 2015-12-28 DIAGNOSIS — M170 Bilateral primary osteoarthritis of knee: Secondary | ICD-10-CM | POA: Insufficient documentation

## 2015-12-28 DIAGNOSIS — Z2821 Immunization not carried out because of patient refusal: Secondary | ICD-10-CM

## 2015-12-28 DIAGNOSIS — Z79899 Other long term (current) drug therapy: Secondary | ICD-10-CM | POA: Insufficient documentation

## 2015-12-28 LAB — BASIC METABOLIC PANEL
BUN: 12 mg/dL (ref 7–25)
CO2: 28 mmol/L (ref 20–31)
Calcium: 10.5 mg/dL — ABNORMAL HIGH (ref 8.6–10.4)
Chloride: 103 mmol/L (ref 98–110)
Creat: 0.85 mg/dL (ref 0.50–1.05)
Glucose, Bld: 124 mg/dL — ABNORMAL HIGH (ref 65–99)
POTASSIUM: 4 mmol/L (ref 3.5–5.3)
SODIUM: 137 mmol/L (ref 135–146)

## 2015-12-28 MED ORDER — HYDROCHLOROTHIAZIDE 25 MG PO TABS
25.0000 mg | ORAL_TABLET | Freq: Every day | ORAL | Status: DC
Start: 2015-12-28 — End: 2016-09-05

## 2015-12-28 MED ORDER — VALSARTAN 160 MG PO TABS
160.0000 mg | ORAL_TABLET | Freq: Every day | ORAL | Status: DC
Start: 2015-12-28 — End: 2016-08-07

## 2015-12-28 MED ORDER — CYCLOBENZAPRINE HCL 10 MG PO TABS: 10.0000 mg | ORAL_TABLET | Freq: Two times a day (BID) | ORAL | Status: DC | PRN

## 2015-12-28 NOTE — Progress Notes (Signed)
Patient ID: Kathleen [name], female   DOB: 11-22-1961, 55 y.o.   MRN: [id number] Subjective:  Kathleen [name] is a 55 y.o. female with hypertension. She has a past medical history of obesity and osteoarthritis. Patient reports that she has been taking all medications as directed without complications. Patient states that she has been having difficulty losing weight but denies exercise or diet changes. She is requesting referral to a bariatric center for weight loss assistance.  Patient also concerned because she is having constant bilateral knee pain that is not relieved by NSAID's. She would like to go back to Sports Medicine. Current Outpatient Prescriptions  Medication Sig Dispense Refill  . cyclobenzaprine (FLEXERIL) 10 MG tablet Take 1 tablet (10 mg total) by mouth 2 (two) times daily as needed for muscle spasms. 60 tablet 1  . hydrochlorothiazide (HYDRODIURIL) 25 MG tablet Take 1 tablet (25 mg total) by mouth daily. 30 tablet 5  . valsartan (DIOVAN) 160 MG tablet Take 1 tablet (160 mg total) by mouth daily. 30 tablet 5  . albuterol (PROVENTIL HFA;VENTOLIN HFA) 108 (90 BASE) MCG/ACT inhaler Inhale 2 puffs into the lungs every 4 (four) hours as needed for wheezing or shortness of breath. 1 Inhaler 0  . diclofenac sodium (VOLTAREN) 1 % GEL Apply 4 g topically 4 (four) times daily. 100 g 3   No current facility-administered medications for this visit.    Hypertension ROS: taking medications as instructed, no medication side effects noted, no TIA's, no chest pain on exertion, no dyspnea on exertion, no swelling of ankles and no palpitations. All other systems negative other than what is noted in HPI.  Objective:  BP 126/88 mmHg  Pulse 100  Temp(Src) 98 F (36.7 C)  Resp 16  Ht 5\' 6"  (1.676 m)  Wt 329 lb 6.4 oz (149.415 kg)  BMI 53.19 kg/m2  SpO2 100%  Appearance alert, well appearing, and in no distress, oriented to person, place, and time and overweight. General exam BP noted to be well  controlled today in office, S1, S2 normal, no gallop, no murmur, chest clear, no JVD, no HSM, no edema.  Lab review: labs are reviewed, up to date and normal.   Assessment:   Kathleen [name] was seen today for follow-up.  Diagnoses and all orders for this visit:  Malignant hypertension -     hydrochlorothiazide (HYDRODIURIL) 25 MG tablet; Take 1 tablet (25 mg total) by mouth daily. -     valsartan (DIOVAN) 160 MG tablet; Take 1 tablet (160 mg total) by mouth daily. -     Basic Metabolic Panel Patient blood pressure is stable and may continue on current medication.  Education on diet, exercise, and modifiable risk factors discussed. Will obtain appropriate labs as needed. Will follow up in 3-6 months. Weight loss stressed.  Osteoarthritis of both knees, unspecified osteoarthritis type -     cyclobenzaprine (FLEXERIL) 10 MG tablet; Take 1 tablet (10 mg total) by mouth 2 (two) times daily as needed for muscle spasms. -     Ambulatory referral to Sports Medicine I have stressed weight loss regarding knee pain. I will refer her back to sports medicine  Morbid obesity, unspecified obesity type ([place]) -     Ambulatory referral to General Surgery---Bariatric   Refused influenza vaccine Explained that annual influenza is recommended per CDC guidelines and is highly suggested to anyone who has has CHF, COPD, DM or immunocompromised. Benefits of influenza described in detail.  Return in about 3 months (around 03/27/2016)  for Hypertension.  Lance Bosch, NP 12/28/2015 6:32 PM

## 2015-12-28 NOTE — Progress Notes (Signed)
Patient here for follow up on her HTN Patient is requesting a referral to the bariatric clinic Patient has refused flu vaccine today

## 2016-01-03 ENCOUNTER — Telehealth: Payer: Self-pay

## 2016-01-03 NOTE — Telephone Encounter (Signed)
-----   Message from Lance Bosch, NP sent at 01/03/2016  7:42 AM EST ----- Labs are ok other than elevated sugar and slightly elevated calcium.

## 2016-01-03 NOTE — Telephone Encounter (Signed)
Tried to contact patient this am Patient not available  Message left on voice mail to return our call 

## 2016-01-10 ENCOUNTER — Ambulatory Visit (INDEPENDENT_AMBULATORY_CARE_PROVIDER_SITE_OTHER): Payer: Medicaid Other | Admitting: Family Medicine

## 2016-01-10 ENCOUNTER — Encounter: Payer: Self-pay | Admitting: Family Medicine

## 2016-01-10 VITALS — BP 110/87 | HR 84 | Ht 65.0 in | Wt 324.0 lb

## 2016-01-10 DIAGNOSIS — M25562 Pain in left knee: Secondary | ICD-10-CM | POA: Diagnosis not present

## 2016-01-10 DIAGNOSIS — M25561 Pain in right knee: Secondary | ICD-10-CM | POA: Diagnosis not present

## 2016-01-10 DIAGNOSIS — M170 Bilateral primary osteoarthritis of knee: Secondary | ICD-10-CM | POA: Diagnosis not present

## 2016-01-10 MED ORDER — MELOXICAM 7.5 MG PO TABS: 7.5000 mg | ORAL_TABLET | Freq: Every day | ORAL | Status: DC

## 2016-01-10 NOTE — Progress Notes (Signed)
  Kathleen [name] - 55 y.o. female MRN [id number]  Date of birth: 07/30/1961  CC: Bilateral knee pain  SUBJECTIVE:     [name] comes in today for evaluation of bilateral knee pain.  She reports having known bilateral knee OA. She was last seen here on 08/27/2014. She has received multiple steroid injections with minimal benefit in the past as well, and was told she would need bilateral knee replacement by Ortho office. She takes Flexeril as needed in the evening which helps her sleep. They d/c tramadol because of side effects of itching.  Full-term provides minimal benefit. She recently decided she really needs to lose weight as well with the bariatric surgeon who has placed her on a new medication as well as advised her on diet and exercise.  ROS:     No erythema, warmth, fevers, chills, night sweats  PERTINENT  PMH / PSH FH / / SH:  Past Medical, Surgical, Social, and Family History Reviewed & Updated in the EMR.  Pertinent findings include:   Hypertension  OBJECTIVE: BP 146/110  Ht 5\' 6"  (1.676 m)  Wt 316 lb (143.337 kg)  BMI 51.03 kg/m2  LMP 11/13/2011  Physical Exam:  Vital signs are reviewed.  Bilateral Knees: Normal to inspection with no erythema or effusion or obvious bony abnormalities. Palpation normal with no warmth, joint line tenderness, patellar tenderness, or condyle tenderness. ROM full in flexion and extension Ligaments with solid consistent endpoints including ACL, PCL, LCL, MCL. Painful patellar compression Bilateral Patellar glide with severe crepitus  Patellar and quadriceps tendons unremarkable. Hamstring and quadriceps strength is normal.  X-rays of each knee from 2015 are reviewed. Repeat bilateral weight bearing x-rays done today A/P, lateral, and sunrise views reveal bilateral  tricompartment osteoarthritis of the knee. With bone-on-bone within the medial compartment bilaterally, very large osteophytes within the patella groove  ASSESSMENT &  PLAN: Bilateral tricompartment osteoarthritis of the knees with BMI of 51  Recommendations: -Start Mobic at 7.5 mg dose due to history of hypertension. Last creatinine was 0.85 on 01/03/2016 -Discussed the possibility of an injection today but based on minimal results in the past she declined. -We discussed various exercises that may be less painful than walking including water aerobics and straight leg raises. - She will continue to work with bariatric surgery for both surgical and nonsurgical weight loss options. - Total knee BMI is at or below 40.  - Follow up as needed.

## 2016-01-10 NOTE — Progress Notes (Deleted)
  Kathleen [name] - 55 y.o. female MRN [id number]  Date of birth: September 16, 1961  CC: No chief complaint on file.   SUBJECTIVE:   HPI  ROS:     ***  HISTORY: Past Medical, Surgical, Social, and Family History Reviewed & Updated per EMR.  Pertinent Historical Findings include: ***  OBJECTIVE: BP 110/87 mmHg  Pulse 84  Ht 5\' 5"  (1.651 m)  Wt 324 lb (146.965 kg)  BMI 53.92 kg/m2  Physical Exam  MEDICATIONS, LABS & OTHER ORDERS: Previous Medications   ALBUTEROL (PROVENTIL HFA;VENTOLIN HFA) 108 (90 BASE) MCG/ACT INHALER    Inhale 2 puffs into the lungs every 4 (four) hours as needed for wheezing or shortness of breath.   CYCLOBENZAPRINE (FLEXERIL) 10 MG TABLET    Take 1 tablet (10 mg total) by mouth 2 (two) times daily as needed for muscle spasms.   DICLOFENAC SODIUM (VOLTAREN) 1 % GEL    Apply 4 g topically 4 (four) times daily.   HYDROCHLOROTHIAZIDE (HYDRODIURIL) 25 MG TABLET    Take 1 tablet (25 mg total) by mouth daily.   PHENTERMINE 15 MG CAPSULE    Take by mouth.   VALSARTAN (DIOVAN) 160 MG TABLET    Take 1 tablet (160 mg total) by mouth daily.   Modified Medications   No medications on file   New Prescriptions   No medications on file   Discontinued Medications   No medications on file  No orders of the defined types were placed in this encounter.   ASSESSMENT & PLAN: See problem based charting & AVS for pt instructions.

## 2016-01-31 ENCOUNTER — Encounter: Payer: Self-pay | Admitting: Clinical

## 2016-01-31 NOTE — Progress Notes (Signed)
Depression screen [facility] 2/9 01/10/2016 12/28/2015 08/27/2014  Decreased Interest 0 2 0  Down, Depressed, Hopeless 0 1 0  PHQ - 2 Score 0 3 0  Altered sleeping - 2 -  Tired, decreased energy - 2 -  Change in appetite - 3 -  Feeling bad or failure about yourself  - 3 -  Trouble concentrating - 0 -  Moving slowly or fidgety/restless - 0 -  Suicidal thoughts - 0 -  PHQ-9 Score - 13 -    GAD 7 : Generalized Anxiety Score 12/28/2015  Nervous, Anxious, on Edge 0  Control/stop worrying 2  Worry too much - different things 3  Trouble relaxing 2  Restless 1  Easily annoyed or irritable 3  Afraid - awful might happen 2  Total GAD 7 Score 13

## 2016-08-07 ENCOUNTER — Encounter: Payer: Self-pay | Admitting: Family Medicine

## 2016-08-07 ENCOUNTER — Other Ambulatory Visit: Payer: Self-pay | Admitting: Internal Medicine

## 2016-08-07 ENCOUNTER — Ambulatory Visit (INDEPENDENT_AMBULATORY_CARE_PROVIDER_SITE_OTHER): Payer: Medicaid Other | Admitting: Family Medicine

## 2016-08-07 DIAGNOSIS — M25562 Pain in left knee: Secondary | ICD-10-CM | POA: Diagnosis not present

## 2016-08-07 DIAGNOSIS — M25561 Pain in right knee: Secondary | ICD-10-CM | POA: Diagnosis present

## 2016-08-07 DIAGNOSIS — I10 Essential (primary) hypertension: Secondary | ICD-10-CM

## 2016-08-07 NOTE — Progress Notes (Signed)
  Kathleen [name] - 55 y.o. female MRN [id number]  Date of birth: [birth year]  SUBJECTIVE:  Including CC & ROS.  Chief Complaint  Patient presents with  . Knee Pain   Kathleen [name] is a 55 year old female that is following up for bilateral knee pain. The left knee is worse than the right knee in terms of pain. She has been diagnoses bilateral tricompartmental osteoarthritis of the knees. His wanting to have knee replacement surgery but currently has a BMI of 53. She has been trying to exercise and going to a bariatric clinic. She has done several cortisone injections but is unsure if she has had discussed supplementation before. She is wanting a clearance and let her describing that she may go back to work. She works at 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 She does not lift anything while she is at work. She can work from 8-20 hours per week. She reports having received disability for her knee osteoarthritis.  ROS: No unexpected weight loss, fever, chills, numbness/tingling, redness, otherwise see HPI    HISTORY: Past Medical, Surgical, Social, and Family History Reviewed & Updated per EMR.   Pertinent Historical Findings include: PMSHx -  hypertension, worse fracture surgery, tubal ligation, right carpal tunnel release PSHx -  occasional smoker, occasional alcohol use Medications -Flexeril, hydrochlorothiazide, Centerra mean, valsartan  DATA REVIEWED: 08/27/14: Bilateral knee x-rays: Degenerative changes, most consistent with osteoarthritis, of the right knee, which have mildly advanced when compared to the prior study. No fracture or acute finding.  PHYSICAL EXAM:  VS: BP:128/74  HR: bpm  TEMP: ( )  RESP:   HT:5\' 5"  (165.1 cm)   WT:(!) 324 lb (147 kg)  BMI:54 PHYSICAL EXAM: Gen: NAD, alert, cooperative with exam, obese HEENT: clear conjunctiva, EOMI CV:  no edema, capillary refill brisk,  Resp: non-labored, normal speech Skin: no rashes, normal turgor  Neuro: no gross  deficits.  Psych:  alert and oriented Knee:  Mild swelling of both knees bilaterally. No pain to palpation of the quadrant patella tendon. Tenderness to palpation over the medial meniscus bilaterally. No tenderness over the lateral meniscus. Normal strength in the lower extremity. Negative stress testing and valgus and varus. Negative McMurray's test. Normal pulses. Normal sensation.  ASSESSMENT & PLAN:   Bilateral knee pain She has OA of bilateral knees and would like to return to work for a limited amount of hours (8-20 hrs per week)  - work note provided for return work for 8 to 20 hours per week.  - discussed the possibility of viscosupplementation  - she will continue to try to lose weight in order to have knee replacement

## 2016-08-07 NOTE — Assessment & Plan Note (Signed)
She has OA of bilateral knees and would like to return to work for a limited amount of hours (8-20 hrs per week)  - work note provided for return work for 8 to 20 hours per week.  - discussed the possibility of viscosupplementation  - she will continue to try to lose weight in order to have knee replacement

## 2016-09-05 ENCOUNTER — Other Ambulatory Visit: Payer: Self-pay | Admitting: Internal Medicine

## 2016-09-05 DIAGNOSIS — I10 Essential (primary) hypertension: Secondary | ICD-10-CM

## 2016-10-05 ENCOUNTER — Other Ambulatory Visit: Payer: Self-pay | Admitting: Internal Medicine

## 2016-10-05 DIAGNOSIS — I10 Essential (primary) hypertension: Secondary | ICD-10-CM

## 2016-10-11 ENCOUNTER — Other Ambulatory Visit: Payer: Self-pay | Admitting: Internal Medicine

## 2016-10-11 DIAGNOSIS — I10 Essential (primary) hypertension: Secondary | ICD-10-CM

## 2016-10-31 ENCOUNTER — Telehealth: Payer: Self-pay | Admitting: Internal Medicine

## 2016-10-31 DIAGNOSIS — I10 Essential (primary) hypertension: Secondary | ICD-10-CM

## 2016-10-31 MED ORDER — VALSARTAN 160 MG PO TABS: 160.0000 mg | ORAL_TABLET | Freq: Every day | ORAL | 0 refills | Status: DC

## 2016-10-31 MED ORDER — HYDROCHLOROTHIAZIDE 25 MG PO TABS: 25.0000 mg | ORAL_TABLET | Freq: Every day | ORAL | 0 refills | Status: DC

## 2016-10-31 NOTE — Telephone Encounter (Signed)
Patient called the office to request medication refill for  hydrochlorothiazide (HYDRODIURIL) 25 MG tablet and valsartan (DIOVAN) 160 MG tablet. Please call rx to our pharmacy.  Thank you.

## 2016-10-31 NOTE — Telephone Encounter (Signed)
Requested medications refilled. Must have office visit for refills.

## 2016-12-04 ENCOUNTER — Other Ambulatory Visit: Payer: Self-pay | Admitting: Internal Medicine

## 2016-12-04 DIAGNOSIS — I10 Essential (primary) hypertension: Secondary | ICD-10-CM

## 2016-12-10 ENCOUNTER — Telehealth: Payer: Self-pay | Admitting: Internal Medicine

## 2016-12-10 DIAGNOSIS — I10 Essential (primary) hypertension: Secondary | ICD-10-CM

## 2016-12-10 MED ORDER — HYDROCHLOROTHIAZIDE 25 MG PO TABS: 25.0000 mg | ORAL_TABLET | Freq: Every day | ORAL | 0 refills | Status: DC

## 2016-12-10 MED ORDER — VALSARTAN 160 MG PO TABS: 160.0000 mg | ORAL_TABLET | Freq: Every day | ORAL | 0 refills | Status: DC

## 2016-12-10 NOTE — Telephone Encounter (Signed)
Patient called the office to request medication refill for valsartan (DIOVAN) 160 MG tablet and hydrochlorothiazide (HYDRODIURIL) 25 MG tablet. Pt doesn't have any more medication. Please call it in to our pharmacy. Please follow up.  Thank you.

## 2016-12-10 NOTE — Telephone Encounter (Signed)
Patient has not been seen in almost a year. Refilled x 30 days - patient must have appt - please schedule.

## 2016-12-10 NOTE — Telephone Encounter (Signed)
Called and informed patient about her prescription and schedule an appt with a doctor in our office.

## 2016-12-20 ENCOUNTER — Ambulatory Visit: Payer: Medicaid Other | Admitting: Family Medicine

## 2016-12-27 ENCOUNTER — Ambulatory Visit: Payer: Medicaid Other | Admitting: Family Medicine

## 2017-01-14 ENCOUNTER — Telehealth: Payer: Self-pay | Admitting: Internal Medicine

## 2017-01-14 DIAGNOSIS — I10 Essential (primary) hypertension: Secondary | ICD-10-CM

## 2017-01-14 MED ORDER — VALSARTAN 160 MG PO TABS: 160.0000 mg | ORAL_TABLET | Freq: Every day | ORAL | 0 refills | Status: DC

## 2017-01-14 MED ORDER — HYDROCHLOROTHIAZIDE 25 MG PO TABS: 25.0000 mg | ORAL_TABLET | Freq: Every day | ORAL | 0 refills | Status: DC

## 2017-01-14 NOTE — Telephone Encounter (Signed)
Pt called requesting medication refill on valsartan (DIOVAN) 160 MG tablet,hydrochlorothiazide (HYDRODIURIL) 25 MG tablet..the patient states she only has 3 pills left. Pt is scheduled 02/21

## 2017-01-14 NOTE — Telephone Encounter (Signed)
Requested medications refilled - patient must keep office visit for further refills.

## 2017-01-23 ENCOUNTER — Ambulatory Visit: Payer: Medicare Other | Attending: Family Medicine | Admitting: Family Medicine

## 2017-01-23 ENCOUNTER — Encounter: Payer: Self-pay | Admitting: Family Medicine

## 2017-01-23 VITALS — BP 136/87 | HR 71 | Temp 98.0°F | Resp 18 | Ht 66.0 in | Wt 302.8 lb

## 2017-01-23 DIAGNOSIS — M170 Bilateral primary osteoarthritis of knee: Secondary | ICD-10-CM | POA: Insufficient documentation

## 2017-01-23 DIAGNOSIS — I10 Essential (primary) hypertension: Secondary | ICD-10-CM | POA: Insufficient documentation

## 2017-01-23 DIAGNOSIS — Z79899 Other long term (current) drug therapy: Secondary | ICD-10-CM | POA: Diagnosis not present

## 2017-01-23 MED ORDER — HYDROCHLOROTHIAZIDE 25 MG PO TABS: 25.0000 mg | ORAL_TABLET | Freq: Every day | ORAL | 2 refills | Status: DC

## 2017-01-23 MED ORDER — VALSARTAN 160 MG PO TABS: 160.0000 mg | ORAL_TABLET | Freq: Every day | ORAL | 2 refills | Status: DC

## 2017-01-23 MED ORDER — CYCLOBENZAPRINE HCL 10 MG PO TABS: 10.0000 mg | ORAL_TABLET | Freq: Two times a day (BID) | ORAL | 1 refills | Status: DC | PRN

## 2017-01-23 MED ORDER — MELOXICAM 7.5 MG PO TABS: 7.5000 mg | ORAL_TABLET | Freq: Every day | ORAL | 0 refills | Status: DC

## 2017-01-23 NOTE — Progress Notes (Signed)
Subjective:  Patient ID: Kathleen [name], female    DOB: Apr 10, 1961  Age: 56 y.o. MRN: [id number]  CC: Hypertension   HPI Kathleen [name] presents for    Hypertension: Reports being without her blood pressure medications since Wednesday last week. She denies any chest pain, shortness of breath, or swelling of the bilateral lower extremities.  Bilateral knee pain: Chronic bilateral knee pain with orthopedic referrals in the past. Reports it was recommended that she get knee replacements. She reports currently going to a bariatric clinic for weight loss in order to lose weight in preparation for knee replacements in the future. Reports currently taking meloxicam and Tylenol for pain with moderate relief of symptoms.       Outpatient Medications Prior to Visit  Medication Sig Dispense Refill  . albuterol (PROVENTIL HFA;VENTOLIN HFA) 108 (90 BASE) MCG/ACT inhaler Inhale 2 puffs into the lungs every 4 (four) hours as needed for wheezing or shortness of breath. 1 Inhaler 0  . Vitamin D, Ergocalciferol, (DRISDOL) 50000 units CAPS capsule   2  . cyclobenzaprine (FLEXERIL) 10 MG tablet Take 1 tablet (10 mg total) by mouth 2 (two) times daily as needed for muscle spasms. 60 tablet 1  . diclofenac sodium (VOLTAREN) 1 % GEL Apply 4 g topically 4 (four) times daily. 100 g 3  . hydrochlorothiazide (HYDRODIURIL) 25 MG tablet Take 1 tablet (25 mg total) by mouth daily. 30 tablet 0  . meloxicam (MOBIC) 7.5 MG tablet Take 1 tablet (7.5 mg total) by mouth daily. 40 tablet 0  . meloxicam (MOBIC) 7.5 MG tablet     . valsartan (DIOVAN) 160 MG tablet Take 1 tablet (160 mg total) by mouth daily. 30 tablet 0  . phentermine (ADIPEX-P) 37.5 MG tablet   1  . phentermine 15 MG capsule Take by mouth.    . phentermine 37.5 MG capsule Take by mouth.     No facility-administered medications prior to visit.     ROS Review of Systems  Respiratory: Negative.   Cardiovascular: Negative.   Gastrointestinal:  Negative.   Musculoskeletal: Positive for arthralgias.  Skin: Negative.     Objective:  BP 136/87 (BP Location: Left Arm, Patient Position: Sitting, Cuff Size: Normal)   Pulse 71   Temp 98 F (36.7 C) (Oral)   Resp 18   Ht 5\' 6"  (1.676 m)   Wt (!) 302 lb 12.8 oz (137.3 kg)   SpO2 99%   BMI 48.87 kg/m   BP/Weight 01/23/2017 0000000 [id number]  Systolic BP XX123456 0000000 [id number]  Diastolic BP 87 74 87  Wt. (Lbs) 302.8 324 324  BMI 48.87 53.92 53.92     Physical Exam  Cardiovascular: Normal rate, regular rhythm, normal heart sounds and intact distal pulses.   Pulmonary/Chest: Effort normal and breath sounds normal.  Abdominal: Soft. Bowel sounds are normal.  Musculoskeletal: Normal range of motion.  Bilateral knee pain with extension  Skin: Skin is warm and dry.  Nursing note and vitals reviewed.    Assessment & Plan:   Problem List Items Addressed This Visit      Musculoskeletal and Integument   DJD (degenerative joint disease) of knee - Primary   Relevant Medications   meloxicam (MOBIC) 7.5 MG tablet   cyclobenzaprine (FLEXERIL) 10 MG tablet    Other Visit Diagnoses    Malignant hypertension       Relevant Medications   hydrochlorothiazide (HYDRODIURIL) 25 MG tablet   valsartan (DIOVAN) 160 MG tablet   Other Relevant  Orders   BASIC METABOLIC PANEL WITH GFR   Microalbumin/Creatinine Ratio, Urine      Meds ordered this encounter  Medications  . meloxicam (MOBIC) 7.5 MG tablet    Sig: Take 1 tablet (7.5 mg total) by mouth daily.    Dispense:  40 tablet    Refill:  0    Order Specific Question:   Supervising Provider    Answer:   Tresa Garter [id number]  . hydrochlorothiazide (HYDRODIURIL) 25 MG tablet    Sig: Take 1 tablet (25 mg total) by mouth daily.    Dispense:  30 tablet    Refill:  2    Must have office visit for refills    Order Specific Question:   Supervising Provider    Answer:   Tresa Garter [id number]  . valsartan (DIOVAN) 160 MG tablet     Sig: Take 1 tablet (160 mg total) by mouth daily.    Dispense:  30 tablet    Refill:  2    Must have office visit for refills    Order Specific Question:   Supervising Provider    Answer:   Tresa Garter [id number]  . cyclobenzaprine (FLEXERIL) 10 MG tablet    Sig: Take 1 tablet (10 mg total) by mouth 2 (two) times daily as needed for muscle spasms.    Dispense:  60 tablet    Refill:  1    Order Specific Question:   Supervising Provider    Answer:   Tresa Garter [id number]    Follow-up: Return in about 3 months (around 04/22/2017) for Hypertension.   Alfonse Spruce FNP

## 2017-01-23 NOTE — Patient Instructions (Signed)
Managing Your Hypertension Hypertension is commonly called high blood pressure. Blood pressure is a measurement of how strongly your blood is pressing against the walls of your arteries. Arteries are blood vessels that carry blood from your heart throughout your body. Blood pressure does not stay the same. It rises when you are active, excited, or nervous. It lowers when you are sleeping or relaxed. If the numbers that measure your blood pressure stay above normal most of the time, you are at risk for health problems. Hypertension is a long-term (chronic) condition in which blood pressure is elevated. This condition often has no signs or symptoms. The cause of the condition is usually not known. What are blood pressure readings? A blood pressure reading is recorded as two numbers, such as "120 over 80" (or 120/80). The first ("top") number is called the systolic pressure. It is a measure of the pressure in your arteries as the heart beats. The second ("bottom") number is called the diastolic pressure. It is a measure of the pressure in your arteries as the heart relaxes between beats. What does my blood pressure reading mean? Blood pressure is classified into four stages. Based on your blood pressure reading, your health care provider may use the following stages to determine what type of treatment, if any, is needed. Systolic pressure and diastolic pressure are measured in a unit called mm Hg. Normal  Systolic pressure: below 123456.  Diastolic pressure: below 80. Prehypertension  Systolic pressure: 123456.  Diastolic pressure: XX123456. Hypertension stage 1  Systolic pressure: A999333.  Diastolic pressure: A999333. Hypertension stage 2  Systolic pressure: 0000000 or above.  Diastolic pressure: 123XX123 or above. What health risks are associated with hypertension? Managing your hypertension is an important responsibility. Uncontrolled hypertension can lead to:  A heart attack.  A stroke.  A  weakened blood vessel (aneurysm).  Heart failure.  Kidney damage.  Eye damage.  Metabolic syndrome.  Memory and concentration problems. What changes can I make to manage my hypertension? Hypertension can be managed effectively by making lifestyle changes and possibly by taking medicines. Your health care provider will help you come up with a plan to bring your blood pressure within a normal range. Your plan should include the following: Monitoring  Monitor your blood pressure at home as told by your health care provider. Your personal target blood pressure may vary depending on your medical conditions, your age, and other factors.  Have your blood pressure rechecked as told by your health care provider. Lifestyle  Lose weight if necessary.  Get at least 30-45 minutes of aerobic exercise at least 4 times a week.  Do not use any products that contain nicotine or tobacco, such as cigarettes and e-cigarettes. If you need help quitting, ask your health care provider.  Learn ways to reduce stress.  Control any chronic conditions, such as high cholesterol or diabetes. Eating and drinking  Follow the DASH diet. This diet is high in fruits, vegetables, and whole grains. It is low in salt, red meat, and added sugars.  Keep your sodium intake below 2,300 mg per day.  Limit alcoholic beverages. Communication  Review all the medicines you take with your health care provider because there may be side effects or interactions.  Talk with your health care provider about your diet, exercise habits, and other lifestyle factors that may be contributing to hypertension.  See your health care provider regularly. Your health care provider can help you create and adjust your plan for managing hypertension. Will  I need medicine to control my blood pressure? Your health care provider may prescribe medicine if lifestyle changes are not enough to get your blood pressure under control, and if one of  the following is true:  You are 15-77 years of age, and your systolic blood pressure is 140 or higher.  You are 35 years of age or older, and your systolic blood pressure is 150 or higher.  Your diastolic blood pressure is 90 or higher.  You have diabetes, and your systolic blood pressure is over XX123456 or your diastolic blood pressure is over 90.  You have kidney disease, and your blood pressure is above 140/90.  You have heart disease or a history of stroke, and your blood pressure is 140/90 or higher. Take medicines only as told by your health care provider. Follow the directions carefully. Blood pressure medicines must be taken as prescribed. The medicine does not work as well when you skip doses. Skipping doses also puts you at risk for problems. Contact a health care provider if:  You think you are having a reaction to medicines you have taken.  You have repeated (recurrent) headaches.  You feel dizzy.  You have swelling in your ankles.  You have trouble with your vision. Get help right away if:  You develop a severe headache or confusion.  You have unusual weakness or numbness, or you feel faint.  You have severe pain in your chest or abdomen.  You vomit repeatedly.  You have trouble breathing. This information is not intended to replace advice given to you by your health care provider. Make sure you discuss any questions you have with your health care provider. Document Released: 08/13/2012 Document Revised: 07/24/2016 Document Reviewed: 02/17/2016 Elsevier Interactive Patient Education  2017 [place]. Hypertension Hypertension is another name for high blood pressure. High blood pressure forces your heart to work harder to pump blood. A blood pressure reading has two numbers, which includes a higher number over a lower number (example: 110/72). Follow these instructions at home:  Have your blood pressure rechecked by your doctor.  Only take medicine as told by  your doctor. Follow the directions carefully. The medicine does not work as well if you skip doses. Skipping doses also puts you at risk for problems.  Do not smoke.  Monitor your blood pressure at home as told by your doctor. Contact a doctor if:  You think you are having a reaction to the medicine you are taking.  You have repeat headaches or feel dizzy.  You have puffiness (swelling) in your ankles.  You have trouble with your vision. Get help right away if:  You get a very bad headache and are confused.  You feel weak, numb, or faint.  You get chest or belly (abdominal) pain.  You throw up (vomit).  You cannot breathe very well. This information is not intended to replace advice given to you by your health care provider. Make sure you discuss any questions you have with your health care provider. Document Released: 05/07/2008 Document Revised: 04/26/2016 Document Reviewed: 09/11/2013 Elsevier Interactive Patient Education  2017 [employer].

## 2017-01-23 NOTE — Progress Notes (Signed)
Patient is here for establish care HTN  Patient has not taking any meds today  Patient has eaten today  Patient complains both knee pain that is a pain level a 8  Patient needs refill on voltaren & meloxicam

## 2017-04-22 ENCOUNTER — Ambulatory Visit: Payer: Medicaid Other | Admitting: Family Medicine

## 2017-05-01 ENCOUNTER — Other Ambulatory Visit: Payer: Self-pay | Admitting: Family Medicine

## 2017-05-01 DIAGNOSIS — I10 Essential (primary) hypertension: Secondary | ICD-10-CM

## 2017-05-03 DIAGNOSIS — I639 Cerebral infarction, unspecified: Secondary | ICD-10-CM

## 2017-05-03 HISTORY — DX: Cerebral infarction, unspecified: I63.9

## 2017-05-16 ENCOUNTER — Ambulatory Visit (HOSPITAL_BASED_OUTPATIENT_CLINIC_OR_DEPARTMENT_OTHER): Payer: Medicare Other | Admitting: Family Medicine

## 2017-05-16 VITALS — BP 124/86 | HR 89 | Temp 98.8°F | Resp 18 | Ht 66.0 in | Wt 282.6 lb

## 2017-05-16 DIAGNOSIS — I10 Essential (primary) hypertension: Secondary | ICD-10-CM | POA: Insufficient documentation

## 2017-05-16 DIAGNOSIS — R109 Unspecified abdominal pain: Secondary | ICD-10-CM

## 2017-05-16 DIAGNOSIS — R112 Nausea with vomiting, unspecified: Secondary | ICD-10-CM | POA: Diagnosis not present

## 2017-05-16 DIAGNOSIS — Z79899 Other long term (current) drug therapy: Secondary | ICD-10-CM | POA: Insufficient documentation

## 2017-05-16 DIAGNOSIS — Z87898 Personal history of other specified conditions: Secondary | ICD-10-CM | POA: Diagnosis not present

## 2017-05-16 DIAGNOSIS — R42 Dizziness and giddiness: Secondary | ICD-10-CM

## 2017-05-16 DIAGNOSIS — Z6841 Body Mass Index (BMI) 40.0 and over, adult: Secondary | ICD-10-CM | POA: Insufficient documentation

## 2017-05-16 DIAGNOSIS — R9431 Abnormal electrocardiogram [ECG] [EKG]: Secondary | ICD-10-CM | POA: Diagnosis not present

## 2017-05-16 DIAGNOSIS — E669 Obesity, unspecified: Secondary | ICD-10-CM

## 2017-05-16 DIAGNOSIS — I63432 Cerebral infarction due to embolism of left posterior cerebral artery: Secondary | ICD-10-CM | POA: Diagnosis not present

## 2017-05-16 DIAGNOSIS — R079 Chest pain, unspecified: Secondary | ICD-10-CM | POA: Diagnosis not present

## 2017-05-16 DIAGNOSIS — Z131 Encounter for screening for diabetes mellitus: Secondary | ICD-10-CM

## 2017-05-16 DIAGNOSIS — I5030 Unspecified diastolic (congestive) heart failure: Secondary | ICD-10-CM | POA: Diagnosis not present

## 2017-05-16 DIAGNOSIS — Z791 Long term (current) use of non-steroidal anti-inflammatories (NSAID): Secondary | ICD-10-CM | POA: Diagnosis not present

## 2017-05-16 DIAGNOSIS — E785 Hyperlipidemia, unspecified: Secondary | ICD-10-CM | POA: Diagnosis not present

## 2017-05-16 DIAGNOSIS — I639 Cerebral infarction, unspecified: Secondary | ICD-10-CM | POA: Diagnosis not present

## 2017-05-16 MED ORDER — IBUPROFEN 600 MG PO TABS: 600.0000 mg | ORAL_TABLET | Freq: Three times a day (TID) | ORAL | 0 refills | Status: DC | PRN

## 2017-05-16 MED ORDER — SODIUM CHLORIDE 0.9 % IV SOLN: 4.0000 mg | Freq: Once | INTRAVENOUS | Status: DC

## 2017-05-16 MED ORDER — ONDANSETRON HCL 4 MG/2ML IJ SOLN: 4.0000 mg | Freq: Once | INTRAMUSCULAR | Status: DC

## 2017-05-16 MED ORDER — SODIUM CHLORIDE 0.9 % IV BOLUS (SEPSIS): 1000.0000 mL | Freq: Once | INTRAVENOUS | Status: DC

## 2017-05-16 MED ORDER — SODIUM CHLORIDE 0.9 % IJ SOLN: 10.0000 mL | Freq: Once | INTRAMUSCULAR | Status: DC

## 2017-05-16 MED ORDER — ONDANSETRON HCL 4 MG PO TABS: 4.0000 mg | ORAL_TABLET | Freq: Three times a day (TID) | ORAL | 0 refills | Status: DC | PRN

## 2017-05-16 MED ORDER — INDOMETHACIN 50 MG PO CAPS: 50.0000 mg | ORAL_CAPSULE | Freq: Two times a day (BID) | ORAL | 0 refills | Status: DC

## 2017-05-16 NOTE — Progress Notes (Signed)
Patient is here for vomiting since yesterday   Feeling dizzy

## 2017-05-16 NOTE — Progress Notes (Signed)
Subjective:  Patient ID: Kathleen [name], female    DOB: 1961/11/16  Age: 56 y.o. MRN: [id number]  CC: Emesis   HPI Kathleen [name] 9. y.o. female who presents for complaints of nausea and vomiting. PMH of HTN, DJD, and obesity. She reports symptoms began yesterday afternoon around 1 pm and have persisted. She reports 5 episodes of vomiting from that time until present. She denies any fevers, chills, diarrhea, hematochezia,  or coffee ground emesis. She does report poor appetite, poor fluid intake, epigastric tenderness, and dizziness. Intake over the last 24 hours has included chicken noodle soup and water, which she reports vomiting. She denies any new foods or new restaurants. She reports co-worker was sick but unsure of symptoms. She also reports history of severe [name] sided flank pain on Monday which persisted until Tuesday. Pain 8/10.  She denies any dysuria or hematuria.  Due to patient vomiting twice in office with complaint of dizziness. Recommended IV fluids and anti-emetics  be administered first. After administration patient refused to void. Purpose and importance of urine collection including evaluation for hematuria or infection disclosed to patient. Patient states " Arnetha Gula already took enough blood", referring to IV insertion and lab draws performed in office. Patient refused.   Outpatient Medications Prior to Visit  Medication Sig Dispense Refill  . albuterol (PROVENTIL HFA;VENTOLIN HFA) 108 (90 BASE) MCG/ACT inhaler Inhale 2 puffs into the lungs every 4 (four) hours as needed for wheezing or shortness of breath. 1 Inhaler 0  . cyclobenzaprine (FLEXERIL) 10 MG tablet Take 1 tablet (10 mg total) by mouth 2 (two) times daily as needed for muscle spasms. 60 tablet 1  . hydrochlorothiazide (HYDRODIURIL) 25 MG tablet TAKE 1 TABLET BY MOUTH DAILY 30 tablet 0  . meloxicam (MOBIC) 7.5 MG tablet Take 1 tablet (7.5 mg total) by mouth daily. 40 tablet 0  . phentermine (ADIPEX-P) 37.5 MG  tablet   1  . phentermine 15 MG capsule Take by mouth.    . phentermine 37.5 MG capsule Take by mouth.    . valsartan (DIOVAN) 160 MG tablet TAKE 1 TABLET BY MOUTH DAILY. 30 tablet 0  . Vitamin D, Ergocalciferol, (DRISDOL) 50000 units CAPS capsule   2   No facility-administered medications prior to visit.     ROS Review of Systems  Constitutional: Positive for appetite change (poor intake).  HENT: Negative.   Eyes: Negative.   Respiratory: Negative.   Cardiovascular: Negative.   Gastrointestinal: Positive for abdominal pain (epigastric tenderness), nausea and vomiting.  Endocrine: Negative.   Genitourinary: Positive for flank pain (history of [name] sided flank pain).  Skin: Negative.   Neurological: Positive for dizziness.  Psychiatric/Behavioral: Negative.    Objective:  BP 124/86 (BP Location: Left Arm, Patient Position: Sitting, Cuff Size: Normal)   Pulse 89   Temp 98.8 F (37.1 C) (Oral)   Resp 18   Ht '5\' 6"'  (1.676 m)   Wt 282 lb 9.6 oz (128.2 kg)   SpO2 99%   BMI 45.61 kg/m   BP/Weight 05/16/2017 1/60/1093 01/06/5572  Systolic BP 220 254 270  Diastolic BP 86 87 74  Wt. (Lbs) 282.6 302.8 324  BMI 45.61 48.87 53.92     Physical Exam  Constitutional: She is oriented to person, place, and time. She appears well-developed. She has a sickly appearance.  HENT:  Head: Normocephalic and atraumatic.  [name] Ear: External ear normal.  Left Ear: External ear normal.  Nose: Nose normal.  Mouth/Throat: Mucous membranes are  pale and dry.  Eyes: Conjunctivae are normal. Pupils are equal, round, and reactive to light.  Neck: No JVD present.  Cardiovascular: Normal rate, regular rhythm, normal heart sounds and intact distal pulses.   Pulmonary/Chest: Effort normal and breath sounds normal.  Abdominal: Soft. Bowel sounds are normal. There is tenderness (epigastric). There is no CVA tenderness.  Vomited twice in office  Neurological: She is alert and oriented to person, place,  and time.  Skin: Skin is warm and dry.  Psychiatric: Her mood appears anxious.  Nursing note and vitals reviewed.   Assessment & Plan:   Problem List Items Addressed This Visit      Cardiovascular and Mediastinum   Hypertension    Other Visit Diagnoses    Nausea and vomiting, intractability of vomiting not specified, unspecified vomiting type    -  Primary   IV fluid resuscitation with 1 L NS fluid bolus.   Zofran x 2   Labs and imaging to determine etiology.    Give strict Emergency precautions if symptoms worsen or fail to improve within 24 hours.    Relevant Medications   sodium chloride 0.9 % bolus 1,000 mL   sodium chloride 0.9 % injection 10 mL   ondansetron (ZOFRAN) injection 4 mg   ondansetron (ZOFRAN) injection 4 mg   Other Relevant Orders   CMP14+EGFR   CBC with Differential   Urinalysis Dipstick   Lipase   Amylase   DG Abd 1 View   History of [name] flank pain       Efforts made to collect urine sample. Purpose and importance of urine collection including evaluation      for hematuria or infection disclosed to patient. Patient refused to void.   Relevant Medications   ibuprofen (ADVIL,MOTRIN) 600 MG tablet   Other Relevant Orders   Urinalysis Dipstick   DG Abd 1 View   Dizziness       Suspect symptom is related to poor fluid intake.    Relevant Medications   sodium chloride 0.9 % injection 10 mL   sodium chloride 0.9 % bolus 1,000 mL      Meds ordered this encounter  Medications  . sodium chloride 0.9 % injection 10 mL  . sodium chloride 0.9 % bolus 1,000 mL  . DISCONTD: ondansetron (ZOFRAN) 4 mg in sodium chloride 0.9 % 50 mL IVPB  . ondansetron (ZOFRAN) 4 MG tablet    Sig: Take 1 tablet (4 mg total) by mouth every 8 (eight) hours as needed for nausea or vomiting.    Dispense:  20 tablet    Refill:  0    Order Specific Question:   Supervising Provider    Answer:   Tresa Garter [id number]  . DISCONTD: indomethacin (INDOCIN) 50 MG capsule     Sig: Take 1 capsule (50 mg total) by mouth 2 (two) times daily with a meal.    Dispense:  28 capsule    Refill:  0    Order Specific Question:   Supervising Provider    Answer:   Tresa Garter [id number]  . ibuprofen (ADVIL,MOTRIN) 600 MG tablet    Sig: Take 1 tablet (600 mg total) by mouth every 8 (eight) hours as needed for moderate pain or cramping.    Dispense:  30 tablet    Refill:  0    Order Specific Question:   Supervising Provider    Answer:   Tresa Garter [id number]  . ondansetron (ZOFRAN) injection 4 mg  .  ondansetron (ZOFRAN) injection 4 mg    Follow-up:  Go the Emergency Department if symptoms worsen or fail to improve.   Alfonse Spruce FNP

## 2017-05-16 NOTE — Patient Instructions (Signed)
If symptoms worsen or fail to improve within 24 hours and/ or you are not able to tolerate drinking fluids without vomiting, start to develop severe abdominal pain, loss of consciousness, confusion, weakness. Go the the EMERGENCY DEPARTMENT for further evaluation.      Nausea and Vomiting, Adult Feeling sick to your stomach (nausea) means that your stomach is upset or you feel like you have to throw up (vomit). Feeling more and more sick to your stomach can lead to throwing up. Throwing up happens when food and liquid from your stomach are thrown up and out the mouth. Throwing up can make you feel weak and cause you to get dehydrated. Dehydration can make you tired and thirsty, make you have a dry mouth, and make it so you pee (urinate) less often. Older adults and people with other diseases or a weak defense system (immune system) are at higher risk for dehydration. If you feel sick to your stomach or if you throw up, it is important to follow instructions from your doctor about how to take care of yourself. Follow these instructions at home: Eating and drinking Follow these instructions as told by your doctor:  Take an oral rehydration solution (ORS). This is a drink that is sold at pharmacies and stores.  Drink clear fluids in small amounts as you are able, such as: ? Water. ? Ice chips. ? Diluted fruit juice. ? Low-calorie sports drinks.  Eat bland, easy-to-digest foods in small amounts as you are able, such as: ? Bananas. ? Applesauce. ? Rice. ? Low-fat (lean) meats. ? Toast. ? Crackers.  Avoid fluids that have a lot of sugar or caffeine in them.  Avoid alcohol.  Avoid spicy or fatty foods.  General instructions  Drink enough fluid to keep your pee (urine) clear or pale yellow.  Wash your hands often. If you cannot use soap and water, use hand sanitizer.  Make sure that all people in your home wash their hands well and often.  Take over-the-counter and prescription  medicines only as told by your doctor.  Rest at home while you get better.  Watch your condition for any changes.  Breathe slowly and deeply when you feel sick to your stomach.  Keep all follow-up visits as told by your doctor. This is important. Contact a doctor if:  You have a fever.  You cannot keep fluids down.  Your symptoms get worse.  You have new symptoms.  You feel sick to your stomach for more than two days.  You feel light-headed or dizzy.  You have a headache.  You have muscle cramps. Get help right away if:  You have pain in your chest, neck, arm, or jaw.  You feel very weak or you pass out (faint).  You throw up again and again.  You see blood in your throw-up.  Your throw-up looks like black coffee grounds.  You have bloody or black poop (stools) or poop that look like tar.  You have a very bad headache, a stiff neck, or both.  You have a rash.  You have very bad pain, cramping, or bloating in your belly (abdomen).  You have trouble breathing.  You are breathing very quickly.  Your heart is beating very quickly.  Your skin feels cold and clammy.  You feel confused.  You have pain when you pee.  You have signs of dehydration, such as: ? Dark pee, hardly any pee, or no pee. ? Cracked lips. ? Dry mouth. ? Sunken  eyes. ? Sleepiness. ? Weakness. These symptoms may be an emergency. Do not wait to see if the symptoms will go away. Get medical help right away. Call your local emergency services (911 in the [place] Do not drive yourself to the hospital. This information is not intended to replace advice given to you by your health care provider. Make sure you discuss any questions you have with your health care provider. Document Released: 05/07/2008 Document Revised: 06/08/2016 Document Reviewed: 07/26/2015 Elsevier Interactive Patient Education  2018 [employer].

## 2017-05-17 ENCOUNTER — Emergency Department (HOSPITAL_COMMUNITY): Payer: Medicare Other

## 2017-05-17 ENCOUNTER — Encounter (HOSPITAL_COMMUNITY): Payer: Self-pay | Admitting: Emergency Medicine

## 2017-05-17 ENCOUNTER — Inpatient Hospital Stay (HOSPITAL_COMMUNITY)
Admission: EM | Admit: 2017-05-17 | Discharge: 2017-05-19 | DRG: 065 | Disposition: A | Discharge status: Home / Self Care | Payer: Medicare Other | Attending: Internal Medicine | Admitting: Internal Medicine

## 2017-05-17 ENCOUNTER — Other Ambulatory Visit: Payer: Self-pay | Admitting: Family Medicine

## 2017-05-17 DIAGNOSIS — I639 Cerebral infarction, unspecified: Secondary | ICD-10-CM | POA: Diagnosis not present

## 2017-05-17 DIAGNOSIS — Z888 Allergy status to other drugs, medicaments and biological substances status: Secondary | ICD-10-CM | POA: Diagnosis not present

## 2017-05-17 DIAGNOSIS — Z720 Tobacco use: Secondary | ICD-10-CM

## 2017-05-17 DIAGNOSIS — I63432 Cerebral infarction due to embolism of left posterior cerebral artery: Principal | ICD-10-CM | POA: Diagnosis present

## 2017-05-17 DIAGNOSIS — Z885 Allergy status to narcotic agent status: Secondary | ICD-10-CM

## 2017-05-17 DIAGNOSIS — I4891 Unspecified atrial fibrillation: Secondary | ICD-10-CM

## 2017-05-17 DIAGNOSIS — F17210 Nicotine dependence, cigarettes, uncomplicated: Secondary | ICD-10-CM | POA: Diagnosis present

## 2017-05-17 DIAGNOSIS — Z131 Encounter for screening for diabetes mellitus: Secondary | ICD-10-CM

## 2017-05-17 DIAGNOSIS — I10 Essential (primary) hypertension: Secondary | ICD-10-CM

## 2017-05-17 DIAGNOSIS — Z79899 Other long term (current) drug therapy: Secondary | ICD-10-CM

## 2017-05-17 DIAGNOSIS — R29700 NIHSS score 0: Secondary | ICD-10-CM | POA: Diagnosis present

## 2017-05-17 DIAGNOSIS — I5030 Unspecified diastolic (congestive) heart failure: Secondary | ICD-10-CM | POA: Diagnosis present

## 2017-05-17 DIAGNOSIS — R079 Chest pain, unspecified: Secondary | ICD-10-CM | POA: Diagnosis not present

## 2017-05-17 DIAGNOSIS — R9431 Abnormal electrocardiogram [ECG] [EKG]: Secondary | ICD-10-CM | POA: Diagnosis not present

## 2017-05-17 DIAGNOSIS — R4789 Other speech disturbances: Secondary | ICD-10-CM | POA: Diagnosis present

## 2017-05-17 DIAGNOSIS — I63112 Cerebral infarction due to embolism of left vertebral artery: Secondary | ICD-10-CM

## 2017-05-17 DIAGNOSIS — R112 Nausea with vomiting, unspecified: Secondary | ICD-10-CM

## 2017-05-17 DIAGNOSIS — G43A1 Cyclical vomiting, intractable: Secondary | ICD-10-CM | POA: Diagnosis not present

## 2017-05-17 DIAGNOSIS — Z6841 Body Mass Index (BMI) 40.0 and over, adult: Secondary | ICD-10-CM | POA: Diagnosis not present

## 2017-05-17 DIAGNOSIS — R260 Ataxic gait: Secondary | ICD-10-CM | POA: Diagnosis present

## 2017-05-17 DIAGNOSIS — I110 Hypertensive heart disease with heart failure: Secondary | ICD-10-CM | POA: Diagnosis present

## 2017-05-17 DIAGNOSIS — Z791 Long term (current) use of non-steroidal anti-inflammatories (NSAID): Secondary | ICD-10-CM

## 2017-05-17 DIAGNOSIS — I6340 Cerebral infarction due to embolism of unspecified cerebral artery: Secondary | ICD-10-CM | POA: Insufficient documentation

## 2017-05-17 DIAGNOSIS — E785 Hyperlipidemia, unspecified: Secondary | ICD-10-CM | POA: Diagnosis present

## 2017-05-17 DIAGNOSIS — R42 Dizziness and giddiness: Secondary | ICD-10-CM | POA: Diagnosis not present

## 2017-05-17 LAB — CBC WITH DIFFERENTIAL/PLATELET
BASOS ABS: 0 10*3/uL (ref 0.0–0.2)
Basos: 0 %
EOS (ABSOLUTE): 0.1 10*3/uL (ref 0.0–0.4)
Eos: 1 %
Hematocrit: 41 % (ref 34.0–46.6)
Hemoglobin: 13.6 g/dL (ref 11.1–15.9)
Immature Grans (Abs): 0 10*3/uL (ref 0.0–0.1)
Immature Granulocytes: 0 %
Lymphocytes Absolute: 1.9 10*3/uL (ref 0.7–3.1)
Lymphs: 24 %
MCH: 27.5 pg (ref 26.6–33.0)
MCHC: 33.2 g/dL (ref 31.5–35.7)
MCV: 83 fL (ref 79–97)
MONOS ABS: 0.7 10*3/uL (ref 0.1–0.9)
Monocytes: 9 %
Neutrophils Absolute: 5 10*3/uL (ref 1.4–7.0)
Neutrophils: 66 %
Platelets: 194 10*3/uL (ref 150–379)
RBC: 4.95 x10E6/uL (ref 3.77–5.28)
RDW: 14.2 % (ref 12.3–15.4)
WBC: 7.6 10*3/uL (ref 3.4–10.8)

## 2017-05-17 LAB — CMP14+EGFR
A/G RATIO: 1.6 (ref 1.2–2.2)
ALBUMIN: 4.3 g/dL (ref 3.5–5.5)
ALK PHOS: 79 IU/L (ref 39–117)
ALT: 17 IU/L (ref 0–32)
AST: 23 IU/L (ref 0–40)
BILIRUBIN TOTAL: 0.3 mg/dL (ref 0.0–1.2)
BUN/Creatinine Ratio: 12 (ref 9–23)
BUN: 9 mg/dL (ref 6–24)
CHLORIDE: 101 mmol/L (ref 96–106)
CO2: 25 mmol/L (ref 20–29)
Calcium: 10.8 mg/dL — ABNORMAL HIGH (ref 8.7–10.2)
Creatinine, Ser: 0.75 mg/dL (ref 0.57–1.00)
GFR calc non Af Amer: 91 mL/min/{1.73_m2} (ref >59)
GFR, EST AFRICAN AMERICAN: 105 mL/min/{1.73_m2} (ref >59)
GLOBULIN, TOTAL: 2.7 g/dL (ref 1.5–4.5)
GLUCOSE: 112 mg/dL — AB (ref 65–99)
POTASSIUM: 3.6 mmol/L (ref 3.5–5.2)
SODIUM: 142 mmol/L (ref 134–144)
Total Protein: 7 g/dL (ref 6.0–8.5)

## 2017-05-17 LAB — URINALYSIS, ROUTINE W REFLEX MICROSCOPIC
BILIRUBIN URINE: NEGATIVE
GLUCOSE, UA: NEGATIVE mg/dL
HGB URINE DIPSTICK: NEGATIVE
KETONES UR: NEGATIVE mg/dL
Leukocytes, UA: NEGATIVE
NITRITE: NEGATIVE
PH: 6 (ref 5.0–8.0)
Protein, ur: NEGATIVE mg/dL
Specific Gravity, Urine: 1.008 (ref 1.005–1.030)

## 2017-05-17 LAB — CK: CK TOTAL: 69 U/L (ref 38–234)

## 2017-05-17 LAB — CBC
HEMATOCRIT: 44.8 % (ref 36.0–46.0)
HEMOGLOBIN: 13.7 g/dL (ref 12.0–15.0)
MCH: 26.6 pg (ref 26.0–34.0)
MCHC: 30.6 g/dL (ref 30.0–36.0)
MCV: 87 fL (ref 78.0–100.0)
Platelets: 197 10*3/uL (ref 150–400)
RBC: 5.15 MIL/uL — AB (ref 3.87–5.11)
RDW: 14.1 % (ref 11.5–15.5)
WBC: 9.3 10*3/uL (ref 4.0–10.5)

## 2017-05-17 LAB — COMPREHENSIVE METABOLIC PANEL
ALK PHOS: 69 U/L (ref 38–126)
ALT: 23 U/L (ref 14–54)
AST: 26 U/L (ref 15–41)
Albumin: 3.9 g/dL (ref 3.5–5.0)
Anion gap: 9 (ref 5–15)
BUN: 8 mg/dL (ref 6–20)
CALCIUM: 11 mg/dL — AB (ref 8.9–10.3)
CO2: 26 mmol/L (ref 22–32)
CREATININE: 0.9 mg/dL (ref 0.44–1.00)
Chloride: 101 mmol/L (ref 101–111)
GFR calc non Af Amer: >60 mL/min (ref >60)
Glucose, Bld: 121 mg/dL — ABNORMAL HIGH (ref 65–99)
Potassium: 3.7 mmol/L (ref 3.5–5.1)
SODIUM: 136 mmol/L (ref 135–145)
Total Bilirubin: 0.7 mg/dL (ref 0.3–1.2)
Total Protein: 7.4 g/dL (ref 6.5–8.1)

## 2017-05-17 LAB — I-STAT TROPONIN, ED: Troponin i, poc: 0.02 ng/mL (ref 0.00–0.08)

## 2017-05-17 LAB — LIPASE, BLOOD: Lipase: 26 U/L (ref 11–51)

## 2017-05-17 LAB — LIPASE: Lipase: 19 U/L (ref 14–72)

## 2017-05-17 LAB — TROPONIN I: Troponin I: <0.03 ng/mL (ref <0.03)

## 2017-05-17 LAB — AMYLASE: AMYLASE: 47 U/L (ref 31–124)

## 2017-05-17 MED ORDER — STROKE: EARLY STAGES OF RECOVERY BOOK
Freq: Once | Status: AC
  Administered 2017-05-17: 21:00:00

## 2017-05-17 MED ORDER — ACETAMINOPHEN 160 MG/5ML PO SOLN: 650.0000 mg | ORAL | Status: DC | PRN

## 2017-05-17 MED ORDER — ONDANSETRON HCL 4 MG/2ML IJ SOLN
4.0000 mg | Freq: Once | INTRAMUSCULAR | Status: AC
  Administered 2017-05-17: 4 mg via INTRAVENOUS
  Filled 2017-05-17: qty 2

## 2017-05-17 MED ORDER — ONDANSETRON 4 MG PO TBDP
4.0000 mg | ORAL_TABLET | Freq: Once | ORAL | Status: AC
  Administered 2017-05-17: 4 mg via ORAL

## 2017-05-17 MED ORDER — ONDANSETRON 4 MG PO TBDP
ORAL_TABLET | ORAL | Status: AC
  Filled 2017-05-17: qty 1

## 2017-05-17 MED ORDER — ONDANSETRON HCL 4 MG/2ML IJ SOLN: 4.0000 mg | Freq: Once | INTRAMUSCULAR | Status: DC | PRN

## 2017-05-17 MED ORDER — ACETAMINOPHEN 650 MG RE SUPP: 650.0000 mg | RECTAL | Status: DC | PRN

## 2017-05-17 MED ORDER — SODIUM CHLORIDE 0.9 % IV BOLUS (SEPSIS)
500.0000 mL | Freq: Once | INTRAVENOUS | Status: AC
  Administered 2017-05-17: 500 mL via INTRAVENOUS

## 2017-05-17 MED ORDER — SODIUM CHLORIDE 0.9 % IV SOLN
INTRAVENOUS | Status: DC
  Administered 2017-05-17: 21:00:00 via INTRAVENOUS

## 2017-05-17 MED ORDER — MECLIZINE HCL 25 MG PO TABS
25.0000 mg | ORAL_TABLET | Freq: Once | ORAL | Status: AC
  Administered 2017-05-17: 25 mg via ORAL
  Filled 2017-05-17: qty 1

## 2017-05-17 MED ORDER — SODIUM CHLORIDE 0.9 % IV SOLN
INTRAVENOUS | Status: DC
  Administered 2017-05-17: 100 mL/h via INTRAVENOUS

## 2017-05-17 MED ORDER — SENNOSIDES-DOCUSATE SODIUM 8.6-50 MG PO TABS: 1.0000 | ORAL_TABLET | Freq: Every evening | ORAL | Status: DC | PRN

## 2017-05-17 MED ORDER — ACETAMINOPHEN 325 MG PO TABS
650.0000 mg | ORAL_TABLET | ORAL | Status: DC | PRN
  Administered 2017-05-17 – 2017-05-18 (×2): 650 mg via ORAL
  Filled 2017-05-17 (×2): qty 2

## 2017-05-17 MED ORDER — ALBUTEROL SULFATE (2.5 MG/3ML) 0.083% IN NEBU: 3.0000 mL | INHALATION_SOLUTION | RESPIRATORY_TRACT | Status: DC | PRN

## 2017-05-17 NOTE — ED Notes (Signed)
Pt given [place] sandwich, applesauce, and sprite.

## 2017-05-17 NOTE — Addendum Note (Signed)
Addended by: Octaviano Glow on: 05/17/2017 09:56 AM   Modules accepted: Orders

## 2017-05-17 NOTE — H&P (Signed)
History and Physical    Kathleen SHIPPEE XVQ:[id number] DOB: 01/06/1961 DOA: 05/17/2017  Referring MD/NP/PA: EDP PCP: Alfonse Spruce, FNP  Outpatient Specialists:  Patient coming from: Home  Chief Complaint: Dizziness  HPI: Kathleen [name] is a 56 y.o. female with medical history significant for hypertension who presented to the ED with three-day history of nausea and vomiting associated with dizziness with vertiginous signature and moderate to severe frontal headaches without abdominal pain, fever or chills, no neck pain or stiffness, but had noted some visual disturbance, however.  She was seen by her PCP at the [facility] and treated for same without any relief for which reason she came to the emergency room for further evaluation and management     ED Course: At the ED patient was hemodynamically stable with mildly ataxic speech without aphasia and left upper extremity incoordination. Head CT was notable for acute/subacute left cerebellar infarction. She was treated symptomatically for her GI symptoms and neurology consulted who recommended hospitalist admission for further management of acute/subacute CVA  Review of Systems: As per HPI otherwise 10 point review of systems negative.    Past Medical History:  Diagnosis Date  . Hypertension    NO MED MD TO GIVE RX  . Obesity     Past Surgical History:  Procedure Laterality Date  . CARPAL TUNNEL RELEASE  01/12/2012   Procedure: CARPAL TUNNEL RELEASE;  Surgeon: Schuyler Amor, MD;  Location: [place];  Service: Orthopedics;  Laterality: Right;  . CESAREAN SECTION    . NOSE SURGERY     BROKEN   MANY YRS AGO   . TUBAL LIGATION    . WRIST FRACTURE SURGERY  Feb 2013     reports that she has been smoking.  She has never used smokeless tobacco. She reports that she drinks alcohol. She reports that she does not use drugs.  Allergies  Allergen Reactions  . Hydrocodone Itching  . Lisinopril Cough  . Tramadol  Itching    Family History  Problem Relation Age of Onset  . Hypertension Father   . Diabetes Father     Prior to Admission medications   Medication Sig Start Date End Date Taking? Authorizing Provider  albuterol (PROVENTIL HFA;VENTOLIN HFA) 108 (90 BASE) MCG/ACT inhaler Inhale 2 puffs into the lungs every 4 (four) hours as needed for wheezing or shortness of breath. 03/30/15  Yes Mabe, Shanon Brow, NP  cyclobenzaprine (FLEXERIL) 10 MG tablet Take 1 tablet (10 mg total) by mouth 2 (two) times daily as needed for muscle spasms. 01/23/17  Yes Hairston, Maylon Peppers, FNP  hydrochlorothiazide (HYDRODIURIL) 25 MG tablet TAKE 1 TABLET BY MOUTH DAILY 05/01/17  Yes Hairston, Mandesia R, FNP  ibuprofen (ADVIL,MOTRIN) 600 MG tablet Take 1 tablet (600 mg total) by mouth every 8 (eight) hours as needed for moderate pain or cramping. 05/16/17  Yes Hairston, Mandesia R, FNP  meloxicam (MOBIC) 7.5 MG tablet Take 1 tablet (7.5 mg total) by mouth daily. 01/23/17  Yes Hairston, Maylon Peppers, FNP  ondansetron (ZOFRAN) 4 MG tablet Take 1 tablet (4 mg total) by mouth every 8 (eight) hours as needed for nausea or vomiting. 05/16/17  Yes Hairston, Mandesia R, FNP  valsartan (DIOVAN) 160 MG tablet TAKE 1 TABLET BY MOUTH DAILY. 05/01/17  Yes Hairston, Maylon Peppers, FNP  Vitamin D, Ergocalciferol, (DRISDOL) 50000 units CAPS capsule Take 50,000 Units by mouth once a week.  06/08/16  Yes [provider]  phentermine 15 MG capsule Take by mouth.  01/09/16 02/08/16  [provider]    Physical Exam: Vitals:   05/17/17 1645 05/17/17 1700 05/17/17 1745 05/17/17 1830  BP: 120/81 119/76 106/77 112/82  Pulse: 87 70 81 75  Resp: 16 19 19 19   Temp:      SpO2: 100% 99% 96% 94%  Weight:      Height:          Constitutional: NAD, calm, comfortable Vitals:   05/17/17 1645 05/17/17 1700 05/17/17 1745 05/17/17 1830  BP: 120/81 119/76 106/77 112/82  Pulse: 87 70 81 75  Resp: 16 19 19 19   Temp:      SpO2: 100% 99% 96% 94%    Weight:      Height:       Eyes: PERRL, lids and conjunctivae normal ENMT: Mucous membranes are moist. Posterior pharynx clear of any exudate or lesions.Normal dentition.  Neck: normal, supple, no masses, no thyromegaly Respiratory: clear to auscultation bilaterally, no wheezing, no crackles. Normal respiratory effort. No accessory muscle use.  Cardiovascular: Regular rate and rhythm, no murmurs / rubs / gallops. No extremity edema. 2+ pedal pulses. No carotid bruits.  Abdomen: no tenderness, no masses palpated. No hepatosplenomegaly. Bowel sounds positive.  Musculoskeletal: no clubbing / cyanosis. No joint deformity upper and lower extremities. Good ROM, no contractures. Normal muscle tone.  Skin: no rashes, lesions, ulcers. No induration Neurologic: Alert and oriented 3, without obvious focal deficit except for left-sided extremity coordination with ataxia as noted above  Psychiatric: Normal judgment and insight. Alert and oriented x 3. Normal mood.     Labs on Admission: I have personally reviewed following labs and imaging studies  CBC:  Recent Labs Lab 05/16/17 1653 05/17/17 1110  WBC 7.6 9.3  NEUTROABS 5.0  --   HGB 13.6 13.7  HCT 41.0 44.8  MCV 83 87.0  PLT 194 767   Basic Metabolic Panel:  Recent Labs Lab 05/16/17 1653 05/17/17 1110  NA 142 136  K 3.6 3.7  CL 101 101  CO2 25 26  GLUCOSE 112* 121*  BUN 9 8  CREATININE 0.75 0.90  CALCIUM 10.8* 11.0*   GFR: Estimated Creatinine Clearance: 97.8 mL/min (by C-G formula based on SCr of 0.9 mg/dL). Liver Function Tests:  Recent Labs Lab 05/16/17 1653 05/17/17 1110  AST 23 26  ALT 17 23  ALKPHOS 79 69  BILITOT 0.3 0.7  PROT 7.0 7.4  ALBUMIN 4.3 3.9    Recent Labs Lab 05/16/17 1653 05/17/17 1110  LIPASE 19 26  AMYLASE 47  --    No results for input(s): AMMONIA in the last 168 hours. Coagulation Profile: No results for input(s): INR, PROTIME in the last 168 hours. Cardiac Enzymes: No results  for input(s): CKTOTAL, CKMB, CKMBINDEX, TROPONINI in the last 168 hours. BNP (last 3 results) No results for input(s): PROBNP in the last 8760 hours. HbA1C: No results for input(s): HGBA1C in the last 72 hours. CBG: No results for input(s): GLUCAP in the last 168 hours. Lipid Profile: No results for input(s): CHOL, HDL, LDLCALC, TRIG, CHOLHDL, LDLDIRECT in the last 72 hours. Thyroid Function Tests: No results for input(s): TSH, T4TOTAL, FREET4, T3FREE, THYROIDAB in the last 72 hours. Anemia Panel: No results for input(s): VITAMINB12, FOLATE, FERRITIN, TIBC, IRON, RETICCTPCT in the last 72 hours. Urine analysis:    Component Value Date/Time   COLORURINE YELLOW 05/17/2017 1435   APPEARANCEUR CLEAR 05/17/2017 1435   LABSPEC 1.008 05/17/2017 1435   PHURINE 6.0 05/17/2017 1435   GLUCOSEU NEGATIVE  05/17/2017 1435   HGBUR NEGATIVE 05/17/2017 1435   BILIRUBINUR NEGATIVE 05/17/2017 1435   KETONESUR NEGATIVE 05/17/2017 1435   PROTEINUR NEGATIVE 05/17/2017 1435   UROBILINOGEN 2.0 (H) 03/30/2015 1829   NITRITE NEGATIVE 05/17/2017 1435   LEUKOCYTESUR NEGATIVE 05/17/2017 1435   Sepsis Labs: @LABRCNTIP (procalcitonin:4,lacticidven:4) )No results found for this or any previous visit (from the past 240 hour(s)).   Radiological Exams on Admission: Dg Chest 2 View  Result Date: 05/17/2017 CLINICAL DATA:  Chest pain and dizziness EXAM: CHEST  2 VIEW COMPARISON:  March 30, 2015 FINDINGS: There is no edema or consolidation. Heart size and pulmonary vascularity are normal. No adenopathy. There is degenerative change in the thoracic spine. IMPRESSION: No edema or consolidation. Electronically Signed   By: Lowella Grip III M.D.   On: 05/17/2017 12:12   Ct Head Wo Contrast  Result Date: 05/17/2017 CLINICAL DATA:  Dizziness and balance disturbance over the last 48 hours. EXAM: CT HEAD WITHOUT CONTRAST TECHNIQUE: Contiguous axial images were obtained from the base of the skull through the vertex  without intravenous contrast. COMPARISON:  10/31/2013 FINDINGS: Brain: There is acute/subacute infarction affecting the left cerebellum. There may be petechial blood products but there is no frank hematoma. No mass effect. Cerebral hemispheres show mild chronic small-vessel change of the white matter without acute finding. No mass lesion, hydrocephalus or extra-axial collection. Vascular: No significant vascular finding. Skull: Negative Sinuses/Orbits: Clear/normal Other: None significant IMPRESSION: Acute/subacute left cerebellar infarction. Possible petechial blood products but no frank hematoma, mass effect or hydrocephalus. Mild chronic small-vessel change of the white matter. Electronically Signed   By: Nelson Chimes M.D.   On: 05/17/2017 14:32    EKG: Independently reviewed. Sinus rhythm without any acute ST-T wave changes  Assessment/Plan Active Problems:   Cerebral embolism with cerebral infarction   CVA (cerebral vascular accident) ([place])  #1 Left cerebellar CVA: Hold anti-HTN- bp borderline low 2-D echo Carotid Doppler MRI/A - Hold ASA - possible bleed on CT- may start if MRI ok Lipid panel A1c Speech eval PT/OT Neurology consult f/u  #2 Nausea Vomiting: Likely due to diagnosis #1 No obvious GI etiology noted Supportive care with antinauseants/antiemetics as needed  #3 Hypertension: BP borderline Hold antihypertensive regimen for now and follow clinically    DVT prophylaxis:  (SCD's) Code Status: (Full/) Family Communication:  Disposition Plan: (To be determined) Consults called: Neurology (Dr. Cheral Marker) Admission status: (inpatient / tele)   OSEI-BONSU,Emylia Latella MD [facility] Pager [phone]  If 7PM-7AM, please contact night-coverage [email or web address] Password [facility]  05/17/2017, 7:24 PM

## 2017-05-17 NOTE — ED Notes (Signed)
On way to CT 

## 2017-05-17 NOTE — ED Notes (Signed)
Pt aware that urine specimen needed. States she is unable to urinate at this time.

## 2017-05-17 NOTE — ED Notes (Signed)
Pt still unable to urinate 

## 2017-05-17 NOTE — ED Notes (Signed)
Pt given sprite and peanut butter to eat.  Declined crackers.

## 2017-05-17 NOTE — ED Provider Notes (Addendum)
[place] DEPT Provider Note   CSN: [id number] Arrival date & time: 05/17/17  1101     History   Chief Complaint Chief Complaint  Patient presents with  . Dizziness  . Nausea  . Chest Pain    HPI Kathleen [name] is a 56 y.o. female.  Patient with acute onset of room spinning dizziness and had gone just Wednesday some visual problems. All symptoms started on Wednesday. The room spinning has persisted. Associated with nausea and vomiting. Patient with a strange feeling in the back of her head but no real headache. Patient has not had a history of vertigo in the past.      Past Medical History:  Diagnosis Date  . Hypertension    NO MED MD TO GIVE RX  . Obesity     Patient Active Problem List   Diagnosis Date Noted  . Hypertension 05/16/2017  . DJD (degenerative joint disease) of knee 07/01/2014  . Bilateral knee pain 06/30/2014    Past Surgical History:  Procedure Laterality Date  . CARPAL TUNNEL RELEASE  01/12/2012   Procedure: CARPAL TUNNEL RELEASE;  Surgeon: Schuyler Amor, MD;  Location: [place];  Service: Orthopedics;  Laterality: Right;  . CESAREAN SECTION    . NOSE SURGERY     BROKEN   MANY YRS AGO   . TUBAL LIGATION    . WRIST FRACTURE SURGERY  Feb 2013    OB History    No data available       Home Medications    Prior to Admission medications   Medication Sig Start Date End Date Taking? Authorizing Provider  albuterol (PROVENTIL HFA;VENTOLIN HFA) 108 (90 BASE) MCG/ACT inhaler Inhale 2 puffs into the lungs every 4 (four) hours as needed for wheezing or shortness of breath. 03/30/15  Yes Mabe, Shanon Brow, NP  cyclobenzaprine (FLEXERIL) 10 MG tablet Take 1 tablet (10 mg total) by mouth 2 (two) times daily as needed for muscle spasms. 01/23/17  Yes Hairston, Maylon Peppers, FNP  hydrochlorothiazide (HYDRODIURIL) 25 MG tablet TAKE 1 TABLET BY MOUTH DAILY 05/01/17  Yes Hairston, Mandesia R, FNP  ibuprofen (ADVIL,MOTRIN) 600 MG tablet Take 1 tablet (600 mg  total) by mouth every 8 (eight) hours as needed for moderate pain or cramping. 05/16/17  Yes Hairston, Mandesia R, FNP  meloxicam (MOBIC) 7.5 MG tablet Take 1 tablet (7.5 mg total) by mouth daily. 01/23/17  Yes Hairston, Maylon Peppers, FNP  ondansetron (ZOFRAN) 4 MG tablet Take 1 tablet (4 mg total) by mouth every 8 (eight) hours as needed for nausea or vomiting. 05/16/17  Yes Hairston, Mandesia R, FNP  valsartan (DIOVAN) 160 MG tablet TAKE 1 TABLET BY MOUTH DAILY. 05/01/17  Yes Hairston, Maylon Peppers, FNP  Vitamin D, Ergocalciferol, (DRISDOL) 50000 units CAPS capsule Take 50,000 Units by mouth once a week.  06/08/16  Yes [provider]  phentermine 15 MG capsule Take by mouth. 01/09/16 02/08/16  [provider]    Family History Family History  Problem Relation Age of Onset  . Hypertension Father   . Diabetes Father     Social History Social History  Substance Use Topics  . Smoking status: Current Every Day Smoker  . Smokeless tobacco: Never Used     Comment: Smoking about 6 cigs/week  . Alcohol use Yes     Comment: DAILY ALCOHOL      Allergies   Hydrocodone; Lisinopril; and Tramadol   Review of Systems Review of Systems  Constitutional: Negative for fever.  HENT: Negative for congestion.   Eyes: Positive for visual disturbance.  Respiratory: Negative for shortness of breath.   Cardiovascular: Negative for chest pain.  Gastrointestinal: Negative for abdominal pain.  Genitourinary: Negative for dysuria.  Musculoskeletal: Positive for gait problem.  Skin: Negative for rash.  Neurological: Positive for dizziness. Negative for speech difficulty and weakness.  Hematological: Does not bruise/bleed easily.  Psychiatric/Behavioral: Negative for confusion.     Physical Exam Updated Vital Signs BP 110/75   Pulse 65   Temp 98.4 F (36.9 C)   Resp 17   Ht 1.676 m (5\' 6" )   Wt 127.9 kg (282 lb)   LMP 05/08/2017 (Approximate)   SpO2 99%   BMI 45.52 kg/m   Physical  Exam  Constitutional: She appears well-developed and well-nourished. No distress.  HENT:  Head: Normocephalic and atraumatic.  Mouth/Throat: Oropharynx is clear and moist.  Cardiovascular: Normal rate and regular rhythm.   Pulmonary/Chest: Effort normal and breath sounds normal. No respiratory distress.  Abdominal: Soft. Bowel sounds are normal.  Neurological: No cranial nerve deficit or sensory deficit. She exhibits normal muscle tone. Coordination abnormal.  Skin: Skin is warm.  Nursing note and vitals reviewed.    ED Treatments / Results  Labs (all labs ordered are listed, but only abnormal results are displayed) Labs Reviewed  CBC - Abnormal; Notable for the following:       Result Value   RBC 5.15 (*)    All other components within normal limits  COMPREHENSIVE METABOLIC PANEL - Abnormal; Notable for the following:    Glucose, Bld 121 (*)    Calcium 11.0 (*)    All other components within normal limits  LIPASE, BLOOD  URINALYSIS, ROUTINE W REFLEX MICROSCOPIC  I-STAT TROPOININ, ED    EKG  EKG Interpretation  Date/Time:  Friday May 17 2017 11:08:41 EDT Ventricular Rate:  89 PR Interval:  142 QRS Duration: 60 QT Interval:  380 QTC Calculation: 462 R Axis:   54 Text Interpretation:  Sinus rhythm with Premature atrial complexes Otherwise normal ECG Confirmed by Fredia Sorrow [phone]) on 05/17/2017 12:43:24 PM       Radiology Dg Chest 2 View  Result Date: 05/17/2017 CLINICAL DATA:  Chest pain and dizziness EXAM: CHEST  2 VIEW COMPARISON:  March 30, 2015 FINDINGS: There is no edema or consolidation. Heart size and pulmonary vascularity are normal. No adenopathy. There is degenerative change in the thoracic spine. IMPRESSION: No edema or consolidation. Electronically Signed   By: Lowella Grip III M.D.   On: 05/17/2017 12:12   Ct Head Wo Contrast  Result Date: 05/17/2017 CLINICAL DATA:  Dizziness and balance disturbance over the last 48 hours. EXAM: CT HEAD  WITHOUT CONTRAST TECHNIQUE: Contiguous axial images were obtained from the base of the skull through the vertex without intravenous contrast. COMPARISON:  10/31/2013 FINDINGS: Brain: There is acute/subacute infarction affecting the left cerebellum. There may be petechial blood products but there is no frank hematoma. No mass effect. Cerebral hemispheres show mild chronic small-vessel change of the white matter without acute finding. No mass lesion, hydrocephalus or extra-axial collection. Vascular: No significant vascular finding. Skull: Negative Sinuses/Orbits: Clear/normal Other: None significant IMPRESSION: Acute/subacute left cerebellar infarction. Possible petechial blood products but no frank hematoma, mass effect or hydrocephalus. Mild chronic small-vessel change of the white matter. Electronically Signed   By: Nelson Chimes M.D.   On: 05/17/2017 14:32    Procedures Procedures (including critical care time)  Medications Ordered in ED Medications  0.9 %  sodium chloride infusion (100 mL/hr Intravenous New Bag/Given 05/17/17 1303)  ondansetron (ZOFRAN-ODT) disintegrating tablet 4 mg (4 mg Oral Given 05/17/17 1119)  sodium chloride 0.9 % bolus 500 mL (0 mLs Intravenous Stopped 05/17/17 1403)  ondansetron (ZOFRAN) injection 4 mg (4 mg Intravenous Given 05/17/17 1304)  meclizine (ANTIVERT) tablet 25 mg (25 mg Oral Given 05/17/17 1304)     Initial Impression / Assessment and Plan / ED Course  I have reviewed the triage vital signs and the nursing notes.  Pertinent labs & imaging results that were available during my care of the patient were reviewed by me and considered in my medical decision making (see chart for details).     CT consistent with subacute acute infarct. In the cerebellum. Would explain the dizziness. Symptoms. Patient symptoms started on Wednesday. Had the dizziness and had some visual changes at that time. Patient's been complaining hear what seems to be true vertigo with room  spinning.  Based on the CT scan findings discussed with neural hospitalist they will see. Patient will require admission most likely.  Patient has a history of hypertension but blood pressure is well controlled here. No other significant risk factors.   Patient seen by neural hospitalist. Recommending regular medicine admission.  Final Clinical Impressions(s) / ED Diagnoses   Final diagnoses:  Cerebrovascular accident (CVA), unspecified mechanism ([place])    New Prescriptions New Prescriptions   No medications on file     Fredia Sorrow, MD 05/17/17 [place]    Fredia Sorrow, MD 05/17/17 [phone]

## 2017-05-17 NOTE — Progress Notes (Signed)
Received via ED stretcher.  C/O headache mid frontal, no nausea or dizziness.  Oriented to department, plan of care, safety precautions & meds.  Stroke education initiated, booklet given.

## 2017-05-17 NOTE — Consult Note (Signed)
Referring Physician: Dr. Rogene Houston    Chief Complaint: New cerebellar stroke on CT  HPI: Kathleen [name] is an 56 y.o. female who presented to the ED with nausea and vomiting. Symptoms began on Wednesday with vertigo. She also had some visual problems. Vertigo has persisted since Wednesday. She has no prior history of vertigo. Also with an unusual sensation to the back of her head, in addition to a 7/10 frontal headache. Endorses poor appetite and fluid intake due to the nausea, with vomiting induced by oral intake. Marland Kitchen  Past Medical History:  Diagnosis Date  . Hypertension    NO MED MD TO GIVE RX  . Obesity     Past Surgical History:  Procedure Laterality Date  . CARPAL TUNNEL RELEASE  01/12/2012   Procedure: CARPAL TUNNEL RELEASE;  Surgeon: Schuyler Amor, MD;  Location: [place];  Service: Orthopedics;  Laterality: Right;  . CESAREAN SECTION    . NOSE SURGERY     BROKEN   MANY YRS AGO   . TUBAL LIGATION    . WRIST FRACTURE SURGERY  Feb 2013    Family History  Problem Relation Age of Onset  . Hypertension Father   . Diabetes Father    Social History:  reports that she has been smoking.  She has never used smokeless tobacco. She reports that she drinks alcohol. She reports that she does not use drugs.  Allergies:  Allergies  Allergen Reactions  . Hydrocodone Itching  . Lisinopril Cough  . Tramadol Itching    Medications:  albuterol (PROVENTIL HFA;VENTOLIN HFA) 108 (90 BASE) MCG/ACT inhaler Inhale 2 puffs into the lungs every 4 (four) hours as needed for wheezing or shortness of breath. Benito Mccreedy, MD Reordered  Orderedas:albuterol (PROVENTIL HFA;VENTOLIN HFA) 108 (90 Base) MCG/ACT inhaler 2 puff - 2 puff, Inhalation, Every 4 hours PRN, wheezing, shortness of breath, Starting Fri 05/17/17 at 1930  cyclobenzaprine (FLEXERIL) 10 MG tablet Take 1 tablet (10 mg total) by mouth 2 (two) times daily as needed for muscle spasms. Benito Mccreedy, MD Not Ordered   hydrochlorothiazide (HYDRODIURIL) 25 MG tablet TAKE 1 TABLET BY MOUTH DAILY Osei-Bonsu, Iona Beard, MD Not Ordered  ibuprofen (ADVIL,MOTRIN) 600 MG tablet Take 1 tablet (600 mg total) by mouth every 8 (eight) hours as needed for moderate pain or cramping. Benito Mccreedy, MD Not Ordered  meloxicam (MOBIC) 7.5 MG tablet Take 1 tablet (7.5 mg total) by mouth daily. Benito Mccreedy, MD Not Ordered  ondansetron (ZOFRAN) 4 MG tablet Take 1 tablet (4 mg total) by mouth every 8 (eight) hours as needed for nausea or vomiting. Benito Mccreedy, MD Not Ordered  valsartan (DIOVAN) 160 MG tablet TAKE 1 TABLET BY MOUTH DAILY. Benito Mccreedy, MD Not Ordered  Vitamin D, Ergocalciferol, (DRISDOL) 50000 units CAPS capsule Take 50,000 Units by mouth once a week.  Benito Mccreedy, MD Not Ordered  ondansetron [facility]) injection 4 mg  Osei-Bonsu, Iona Beard, MD Not Ordered  ondansetron (ZOFRAN) injection 4 mg  Osei-Bonsu, Iona Beard, MD Not Ordered  phentermine 15 MG capsule Take by mouth. Benito Mccreedy, MD Not Ordered  sodium chloride 0.9 % injection 10 mL  Osei-Bonsu, George, MD Not Ordered    ROS: No fever or chills. No dysuria. Positive for flank pain. Has a 7/10 frontal headache. Other ROS as per HPI.   Physical Examination: Blood pressure 119/79, pulse 70, temperature 98.1 F (36.7 C), resp. rate 18, height '5\' 6"'  (1.676 m), weight 127.9 kg (282 lb), last menstrual period 05/08/2017, SpO2 98 %.  HEENT: [place]/AT Lungs: Respirations unlabored.  Ext: Warm and well perfused.   Neurologic Examination: Mental Status: Alert, oriented, thought content appropriate.  Mildly ataxic speech. Otherwise, speech is fluent without evidence of aphasia.  Able to follow all commands without difficulty. Cranial Nerves: II:  Visual fields intact. PERRL.  III,IV, VI: ptosis not present, EOMI without nystagmus V,VII: smile symmetric, facial temp sensation normal bilaterally VIII: hearing intact to conversation IX,X: no  hypophonia XI: Symmetric XII: midline tongue extension  Motor: Right : Upper extremity   5/5    Left:     Upper extremity   5/5  Lower extremity   5/5     Lower extremity   5/5 No atrophy noted Sensory: Decreased temperature sensation to RLE. Otherwise unremarkable. Deep Tendon Reflexes:  2+ right brachioradialis and biceps. 1+ left brachioradialis and biceps. 2+ right patella and achilles 1+ left patella, 2+ left achilles Plantars: Right: downgoing  Left: downgoing Cerebellar: Left sided RAM impaired. No definite ataxia with FNF.  Gait: Deferred   Results for orders placed or performed during the hospital encounter of 05/17/17 (from the past 48 hour(s))  CBC     Status: Abnormal   Collection Time: 05/17/17 11:10 AM  Result Value Ref Range   WBC 9.3 4.0 - 10.5 K/uL   RBC 5.15 (H) 3.87 - 5.11 MIL/uL   Hemoglobin 13.7 12.0 - 15.0 g/dL   HCT 44.8 36.0 - 46.0 %   MCV 87.0 78.0 - 100.0 fL   MCH 26.6 26.0 - 34.0 pg   MCHC 30.6 30.0 - 36.0 g/dL   RDW 14.1 11.5 - 15.5 %   Platelets 197 150 - 400 K/uL  Lipase, blood     Status: None   Collection Time: 05/17/17 11:10 AM  Result Value Ref Range   Lipase 26 11 - 51 U/L  Comprehensive metabolic panel     Status: Abnormal   Collection Time: 05/17/17 11:10 AM  Result Value Ref Range   Sodium 136 135 - 145 mmol/L   Potassium 3.7 3.5 - 5.1 mmol/L   Chloride 101 101 - 111 mmol/L   CO2 26 22 - 32 mmol/L   Glucose, Bld 121 (H) 65 - 99 mg/dL   BUN 8 6 - 20 mg/dL   Creatinine, Ser 0.90 0.44 - 1.00 mg/dL   Calcium 11.0 (H) 8.9 - 10.3 mg/dL   Total Protein 7.4 6.5 - 8.1 g/dL   Albumin 3.9 3.5 - 5.0 g/dL   AST 26 15 - 41 U/L   ALT 23 14 - 54 U/L   Alkaline Phosphatase 69 38 - 126 U/L   Total Bilirubin 0.7 0.3 - 1.2 mg/dL   GFR calc non Af Amer >60 >60 mL/min   GFR calc Af Amer >60 >60 mL/min    Comment: (NOTE) The eGFR has been calculated using the CKD EPI equation. This calculation has not been validated in all clinical  situations. eGFR's persistently <60 mL/min signify possible Chronic Kidney Disease.    Anion gap 9 5 - 15  I-stat troponin, ED     Status: None   Collection Time: 05/17/17 11:23 AM  Result Value Ref Range   Troponin i, poc 0.02 0.00 - 0.08 ng/mL   Comment 3            Comment: Due to the release kinetics of cTnI, a negative result within the first hours of the onset of symptoms does not rule out myocardial infarction with certainty. If myocardial infarction is still suspected,  repeat the test at appropriate intervals.   Urinalysis, Routine w reflex microscopic     Status: None   Collection Time: 05/17/17  2:35 PM  Result Value Ref Range   Color, Urine YELLOW YELLOW   APPearance CLEAR CLEAR   Specific Gravity, Urine 1.008 1.005 - 1.030   pH 6.0 5.0 - 8.0   Glucose, UA NEGATIVE NEGATIVE mg/dL   Hgb urine dipstick NEGATIVE NEGATIVE   Bilirubin Urine NEGATIVE NEGATIVE   Ketones, ur NEGATIVE NEGATIVE mg/dL   Protein, ur NEGATIVE NEGATIVE mg/dL   Nitrite NEGATIVE NEGATIVE   Leukocytes, UA NEGATIVE NEGATIVE   Dg Chest 2 View  Result Date: 05/17/2017 CLINICAL DATA:  Chest pain and dizziness EXAM: CHEST  2 VIEW COMPARISON:  March 30, 2015 FINDINGS: There is no edema or consolidation. Heart size and pulmonary vascularity are normal. No adenopathy. There is degenerative change in the thoracic spine. IMPRESSION: No edema or consolidation. Electronically Signed   By: Lowella Grip III M.D.   On: 05/17/2017 12:12   Ct Head Wo Contrast  Result Date: 05/17/2017 CLINICAL DATA:  Dizziness and balance disturbance over the last 48 hours. EXAM: CT HEAD WITHOUT CONTRAST TECHNIQUE: Contiguous axial images were obtained from the base of the skull through the vertex without intravenous contrast. COMPARISON:  10/31/2013 FINDINGS: Brain: There is acute/subacute infarction affecting the left cerebellum. There may be petechial blood products but there is no frank hematoma. No mass effect. Cerebral  hemispheres show mild chronic small-vessel change of the white matter without acute finding. No mass lesion, hydrocephalus or extra-axial collection. Vascular: No significant vascular finding. Skull: Negative Sinuses/Orbits: Clear/normal Other: None significant IMPRESSION: Acute/subacute left cerebellar infarction. Possible petechial blood products but no frank hematoma, mass effect or hydrocephalus. Mild chronic small-vessel change of the white matter. Electronically Signed   By: Nelson Chimes M.D.   On: 05/17/2017 14:32    Assessment: 56 y.o. female with acute onset of vertigo and nausea with vomiting. CT shows left cerebellar stroke. Exam reveals mildly ataxic speech and mild ataxia of LUE. 1. CT head reveals a subacute left cerebellar infarction with possible petechial blood products but no frank hematoma, mass effect or hydrocephalus. Mild chronic small-vessel changes of the white matter also noted. 2. Stroke Risk Factors - HTN and obesity  Plan: 1. HgbA1c, fasting lipid panel 2. MRI, MRA  of the brain without contrast 3. PT consult, OT consult, Speech consult 4. Echocardiogram 5. Carotid dopplers 6. Hold off on ASA until MRI is obtained, to determine extent of petechial hemorrhage 7. Out of permissive HTN time window 8. Start atorvastatin 40 mg po qd. Obtain baseline CK level.  9. Telemetry monitoring 10. Frequent neuro checks 11. Hypercoagulable panel   '@Electronically'  signed: Dr. Kerney Elbe  05/17/2017, 4:09 PM

## 2017-05-17 NOTE — ED Notes (Addendum)
Pt is back from X ray.

## 2017-05-17 NOTE — ED Notes (Signed)
Regular tray also ordered for patient.

## 2017-05-17 NOTE — ED Triage Notes (Signed)
Pt arrives from home reporting dizziness with n/v since Wednesday, new L sided Cp this morning, describes as "a little pressure." Pt seen at PCP yesterday for dizziness, n/v.

## 2017-05-18 ENCOUNTER — Other Ambulatory Visit (HOSPITAL_COMMUNITY): Payer: Medicare Other

## 2017-05-18 ENCOUNTER — Inpatient Hospital Stay (HOSPITAL_COMMUNITY): Payer: Medicare Other

## 2017-05-18 ENCOUNTER — Encounter (HOSPITAL_COMMUNITY): Payer: Self-pay | Admitting: Radiology

## 2017-05-18 DIAGNOSIS — Z720 Tobacco use: Secondary | ICD-10-CM

## 2017-05-18 DIAGNOSIS — I5030 Unspecified diastolic (congestive) heart failure: Secondary | ICD-10-CM

## 2017-05-18 DIAGNOSIS — R42 Dizziness and giddiness: Secondary | ICD-10-CM

## 2017-05-18 DIAGNOSIS — E785 Hyperlipidemia, unspecified: Secondary | ICD-10-CM

## 2017-05-18 DIAGNOSIS — I639 Cerebral infarction, unspecified: Secondary | ICD-10-CM

## 2017-05-18 LAB — LIPID PANEL
Cholesterol: 176 mg/dL (ref 0–200)
HDL: 53 mg/dL (ref >40)
LDL CALC: 107 mg/dL — AB (ref 0–99)
TRIGLYCERIDES: 82 mg/dL (ref <150)
Total CHOL/HDL Ratio: 3.3 RATIO
VLDL: 16 mg/dL (ref 0–40)

## 2017-05-18 LAB — ECHOCARDIOGRAM COMPLETE
Height: 66 in
WEIGHTICAEL: 4768.99 [oz_av]

## 2017-05-18 LAB — TROPONIN I

## 2017-05-18 LAB — GLUCOSE, CAPILLARY
Glucose-Capillary: 107 mg/dL — ABNORMAL HIGH (ref 65–99)
Glucose-Capillary: 121 mg/dL — ABNORMAL HIGH (ref 65–99)

## 2017-05-18 MED ORDER — POLYETHYLENE GLYCOL 3350 17 G PO PACK: 17.0000 g | PACK | Freq: Every day | ORAL | Status: DC | PRN

## 2017-05-18 MED ORDER — ASPIRIN 325 MG PO TABS
325.0000 mg | ORAL_TABLET | Freq: Every day | ORAL | Status: DC
  Administered 2017-05-18 – 2017-05-19 (×2): 325 mg via ORAL
  Filled 2017-05-18 (×2): qty 1

## 2017-05-18 MED ORDER — ATORVASTATIN CALCIUM 40 MG PO TABS
40.0000 mg | ORAL_TABLET | Freq: Every day | ORAL | Status: DC
Start: 2017-05-18 — End: 2017-05-19
  Administered 2017-05-18 – 2017-05-19 (×2): 40 mg via ORAL
  Filled 2017-05-18 (×2): qty 1

## 2017-05-18 MED ORDER — SENNOSIDES-DOCUSATE SODIUM 8.6-50 MG PO TABS
1.0000 | ORAL_TABLET | Freq: Two times a day (BID) | ORAL | Status: DC
  Filled 2017-05-18: qty 1

## 2017-05-18 MED ORDER — MECLIZINE HCL 12.5 MG PO TABS
12.5000 mg | ORAL_TABLET | Freq: Three times a day (TID) | ORAL | Status: DC
  Administered 2017-05-18 – 2017-05-19 (×4): 12.5 mg via ORAL
  Filled 2017-05-18 (×4): qty 1

## 2017-05-18 NOTE — Progress Notes (Signed)
  Echocardiogram 2D Echocardiogram has been performed.  Reya Aurich 05/18/2017, 11:40 AM

## 2017-05-18 NOTE — Discharge Instructions (Signed)
1. Outpatient cardiac monitor - cardiology office should contact you.

## 2017-05-18 NOTE — Progress Notes (Signed)
PT Cancellation Note  Patient Details Name: Kathleen [name] MRN: [id number] DOB: 1961/02/09   Cancelled Treatment:    Reason Eval/Treat Not Completed: Medical issues which prohibited therapy. Pt currently with bed rest orders in place. PT will continue to f/u with pt as activity orders are updated.   [place] 05/18/2017, 8:09 AM

## 2017-05-18 NOTE — Progress Notes (Signed)
VASCULAR LAB PRELIMINARY  PRELIMINARY  PRELIMINARY  PRELIMINARY  Carotid duplex completed.    Preliminary report:  1-39% ICA plaquing. Vertebral artery flow is antegrade.   Raymund Manrique, RVT 05/18/2017, 12:03 PM

## 2017-05-18 NOTE — Progress Notes (Signed)
[facility] PROGRESS NOTE  Kathleen [name] FIE:[id number] DOB: [birth year] DOA: 05/17/2017 PCP: Alfonse Spruce, FNP  Interim summary and HPI 56 y/o female with PMH significant for HTN, obesity and tobacco abuse; who presented with HA's, nausea, dizziness and ataxia. Found to have acute/subacute cerebellum ischemic stroke.  Assessment/Plan: 1-subacute cerebellum stroke: patient with dizziness, vertigo, HA's and nausea -will start on ASA for secondary prevention -patient will also use lipitor for control of LDL -will start low dose TID of meclizine -PT/OT to evaluate and provide rec's -HA resolved, still with mild nausea and significant dizziness sensation and off balance feeling. -30 days event monitoring to be arranged as an outpatient  2-HTN -BP is soft now -will hold antihypertensive regimen and be permissive with elevated BP -heart healthy diet ordered  3-obesity -Body mass index is 48.11 kg/m. -low calorie diet and exercise discussed with patient  4-HLD -use statins  5-tobacco abuse -cessation counseling provided -nicotine patch declined   Code Status: Full Family Communication: no family at bedside Disposition Plan: to be determined. Most likely home in the next 24 hours. Patient will need aspirin and statins for secondary prevention. Will need to be assesses by PT and OT to complete work up.   Consultants:  Neurology   Procedures:  See below for x-ray reports  2-D echo: grade 1 diastolic HF, no source of emboli and preserved EF  Carotid duplex: bilateral 1-39 ICA steonsis and antegrade flow of vertebral arteries.  Antibiotics:  None   HPI/Subjective: Afebrile, no CP, no SOB. Still nauseated and with dizziness sensation.  Objective: Vitals:   05/18/17 1831 05/18/17 2155  BP: 124/78 99/82  Pulse: 62 72  Resp: 20 20  Temp: 98.2 F (36.8 C) 98.2 F (36.8 C)    Intake/Output Summary (Last 24 hours) at 05/18/17 2215 Last data filed at  05/18/17 0300  Gross per 24 hour  Intake             1280 ml  Output              400 ml  Net              880 ml   Filed Weights   05/17/17 1110 05/17/17 2043  Weight: 127.9 kg (282 lb) 135.2 kg (298 lb 1 oz)    Exam:   General:  Afebrile, no CP, no SOB. Still feeling dizzy/woozy and nauseated.  Cardiovascular: no rubs, no gallops, no jvd. S1 and S2 appreciated.  Respiratory: no wheezing, no crackles, good air movement  Abdomen: soft, NT, ND positive BS  Musculoskeletal: no edema or cyanosis, no clubbing  Neurologic exam: no dysarthria, complaining of dizziness and off balance. Normal muscle bulk  Data Reviewed: Basic Metabolic Panel:  Recent Labs Lab 05/16/17 1653 05/17/17 1110  NA 142 136  K 3.6 3.7  CL 101 101  CO2 25 26  GLUCOSE 112* 121*  BUN 9 8  CREATININE 0.75 0.90  CALCIUM 10.8* 11.0*   Liver Function Tests:  Recent Labs Lab 05/16/17 1653 05/17/17 1110  AST 23 26  ALT 17 23  ALKPHOS 79 69  BILITOT 0.3 0.7  PROT 7.0 7.4  ALBUMIN 4.3 3.9    Recent Labs Lab 05/16/17 1653 05/17/17 1110  LIPASE 19 26  AMYLASE 47  --    CBC:  Recent Labs Lab 05/16/17 1653 05/17/17 1110  WBC 7.6 9.3  NEUTROABS 5.0  --   HGB 13.6 13.7  HCT 41.0 44.8  MCV 83 87.0  PLT 194 197   Cardiac Enzymes:  Recent Labs Lab 05/17/17 1952 05/18/17 0114 05/18/17 0650  CKTOTAL 69  --   --   TROPONINI <0.03 <0.03 <0.03   CBG:  Recent Labs Lab 05/18/17 0621 05/18/17 1611  GLUCAP 107* 121*    Studies: Dg Chest 2 View  Result Date: 05/17/2017 CLINICAL DATA:  Chest pain and dizziness EXAM: CHEST  2 VIEW COMPARISON:  March 30, 2015 FINDINGS: There is no edema or consolidation. Heart size and pulmonary vascularity are normal. No adenopathy. There is degenerative change in the thoracic spine. IMPRESSION: No edema or consolidation. Electronically Signed   By: Lowella Grip III M.D.   On: 05/17/2017 12:12   Ct Head Wo Contrast  Result Date:  05/17/2017 CLINICAL DATA:  Dizziness and balance disturbance over the last 48 hours. EXAM: CT HEAD WITHOUT CONTRAST TECHNIQUE: Contiguous axial images were obtained from the base of the skull through the vertex without intravenous contrast. COMPARISON:  10/31/2013 FINDINGS: Brain: There is acute/subacute infarction affecting the left cerebellum. There may be petechial blood products but there is no frank hematoma. No mass effect. Cerebral hemispheres show mild chronic small-vessel change of the white matter without acute finding. No mass lesion, hydrocephalus or extra-axial collection. Vascular: No significant vascular finding. Skull: Negative Sinuses/Orbits: Clear/normal Other: None significant IMPRESSION: Acute/subacute left cerebellar infarction. Possible petechial blood products but no frank hematoma, mass effect or hydrocephalus. Mild chronic small-vessel change of the white matter. Electronically Signed   By: Nelson Chimes M.D.   On: 05/17/2017 14:32   Mr Jodene Nam Head Wo Contrast  Result Date: 05/18/2017 CLINICAL DATA:  Dizziness.  Stroke. EXAM: MRI HEAD WITHOUT CONTRAST MRA HEAD WITHOUT CONTRAST TECHNIQUE: Multiplanar, multiecho pulse sequences of the brain and surrounding structures were obtained without intravenous contrast. Angiographic images of the head were obtained using MRA technique without contrast. COMPARISON:  CT head 05/17/2017 FINDINGS: MRI HEAD FINDINGS Brain: Acute infarct in the left inferior cerebellum PICA territory. No other areas of acute infarct. No hemorrhage in the infarct. Ventricle size normal. Multiple small chronic white matter hyperintensities. Chronic ischemia in the pons left greater than right. Chronic microhemorrhage in the right medial thalamus. Negative for mass lesion. Vascular: Normal arterial flow voids Skull and upper cervical spine: Negative Sinuses/Orbits: Mild mucosal edema in the paranasal sinuses. Normal orbit. Other: None MRA HEAD FINDINGS Internal carotid artery  widely patent bilaterally. Anterior and middle cerebral arteries are patent bilaterally. Mild atherosclerotic disease in the anterior cerebral arteries bilaterally. Mild atherosclerotic disease right middle cerebral artery branches. No large vessel occlusion Both vertebral arteries patent to the basilar. Basilar widely patent. Superior cerebellar and posterior cerebral arteries patent bilaterally Negative for cerebral aneurysm IMPRESSION: Acute infarct left inferior cerebellum without hemorrhage Chronic microhemorrhage right medial thalamus Mild intracranial atherosclerotic disease in the anterior cerebral arteries and right middle cerebral artery. No large vessel occlusion. Electronically Signed   By: Franchot Gallo M.D.   On: 05/18/2017 08:15   Mr Brain Wo Contrast  Result Date: 05/18/2017 CLINICAL DATA:  Dizziness.  Stroke. EXAM: MRI HEAD WITHOUT CONTRAST MRA HEAD WITHOUT CONTRAST TECHNIQUE: Multiplanar, multiecho pulse sequences of the brain and surrounding structures were obtained without intravenous contrast. Angiographic images of the head were obtained using MRA technique without contrast. COMPARISON:  CT head 05/17/2017 FINDINGS: MRI HEAD FINDINGS Brain: Acute infarct in the left inferior cerebellum PICA territory. No other areas of acute infarct. No hemorrhage in the infarct. Ventricle size normal. Multiple small chronic white matter hyperintensities. Chronic  ischemia in the pons left greater than right. Chronic microhemorrhage in the right medial thalamus. Negative for mass lesion. Vascular: Normal arterial flow voids Skull and upper cervical spine: Negative Sinuses/Orbits: Mild mucosal edema in the paranasal sinuses. Normal orbit. Other: None MRA HEAD FINDINGS Internal carotid artery widely patent bilaterally. Anterior and middle cerebral arteries are patent bilaterally. Mild atherosclerotic disease in the anterior cerebral arteries bilaterally. Mild atherosclerotic disease right middle cerebral  artery branches. No large vessel occlusion Both vertebral arteries patent to the basilar. Basilar widely patent. Superior cerebellar and posterior cerebral arteries patent bilaterally Negative for cerebral aneurysm IMPRESSION: Acute infarct left inferior cerebellum without hemorrhage Chronic microhemorrhage right medial thalamus Mild intracranial atherosclerotic disease in the anterior cerebral arteries and right middle cerebral artery. No large vessel occlusion. Electronically Signed   By: Franchot Gallo M.D.   On: 05/18/2017 08:15    Scheduled Meds: . aspirin  325 mg Oral Daily  . atorvastatin  40 mg Oral q1800  . meclizine  12.5 mg Oral TID  . senna-docusate  1 tablet Oral BID   Continuous Infusions: . sodium chloride 75 mL/hr at 05/18/17 1605    Principal Problem:   Cerebral embolism with cerebral infarction Active Problems:   Hypertension   CVA (cerebral vascular accident) ([place])   Nausea & vomiting    Time spent: 25 minutes    Barton Dubois  [facility] Pager [phone]. If 7PM-7AM, please contact night-coverage at [email or web address], password [email or web address] 05/18/2017, 10:15 PM  LOS: 1 day

## 2017-05-18 NOTE — Progress Notes (Signed)
OT Cancellation Note  Patient Details Name: Kathleen [name] MRN: [id number] DOB: 12-18-1960   Cancelled Treatment:    Reason Eval/Treat Not Completed: Medical issues which prohibited therapy. Pt currently with bed rest orders in place. OT will continue to f/u with pt as activity orders are updated and as schedule allows.  [name] 05/18/2017, 8:11 AM

## 2017-05-18 NOTE — Progress Notes (Signed)
STROKE TEAM PROGRESS NOTE   HISTORY OF PRESENT ILLNESS (per record) Kathleen [name] is an 56 y.o. female who presented to the ED with nausea and vomiting. Symptoms began on Wednesday with vertigo. She also had some visual problems. Vertigo has persisted since Wednesday. She has no prior history of vertigo. Also with an unusual sensation to the back of her head, in addition to a 7/10 frontal headache. Endorses poor appetite and fluid intake due to the nausea, with vomiting induced by oral intake. .   SUBJECTIVE (INTERVAL HISTORY) Patient still complains of significant dizziness and gait ataxia. She has been up walking but had to hold on because she was still dizzy and unsteady.   OBJECTIVE Temp:  [98.1 F (36.7 C)-100.4 F (38 C)] 98.6 F (37 C) (06/16 0600) Pulse Rate:  [59-89] 81 (06/16 0600) Cardiac Rhythm: Normal sinus rhythm (06/16 0700) Resp:  [14-22] 16 (06/16 0600) BP: (98-134)/(56-98) 122/73 (06/16 0600) SpO2:  [94 %-100 %] 98 % (06/16 0600) Weight:  [127.9 kg (282 lb)-135.2 kg (298 lb 1 oz)] 135.2 kg (298 lb 1 oz) (06/15 2043)  CBC:  Recent Labs Lab 05/16/17 1653 05/17/17 1110  WBC 7.6 9.3  NEUTROABS 5.0  --   HGB 13.6 13.7  HCT 41.0 44.8  MCV 83 87.0  PLT 194 694    Basic Metabolic Panel:  Recent Labs Lab 05/16/17 1653 05/17/17 1110  NA 142 136  K 3.6 3.7  CL 101 101  CO2 25 26  GLUCOSE 112* 121*  BUN 9 8  CREATININE 0.75 0.90  CALCIUM 10.8* 11.0*    Lipid Panel:    Component Value Date/Time   CHOL 176 05/18/2017 0114   TRIG 82 05/18/2017 0114   HDL 53 05/18/2017 0114   CHOLHDL 3.3 05/18/2017 0114   VLDL 16 05/18/2017 0114   LDLCALC 107 (H) 05/18/2017 0114   HgbA1c: No results found for: HGBA1C Urine Drug Screen: No results found for: LABOPIA, COCAINSCRNUR, LABBENZ, AMPHETMU, THCU, LABBARB  Alcohol Level No results found for: [facility]  IMAGING  Dg Chest 2 View 05/17/2017 No edema or consolidation.   Ct Head Wo  Contrast 05/17/2017 Acute/subacute left cerebellar infarction. Possible petechial blood products but no frank hematoma, mass effect or hydrocephalus. Mild chronic small-vessel change of the white matter.    Mr Jodene Nam Head Wo Contrast 05/18/2017 Acute infarct left inferior cerebellum without hemorrhage Chronic microhemorrhage right medial thalamus Mild intracranial atherosclerotic disease in the anterior cerebral arteries and right middle cerebral artery. No large vessel occlusion.    PHYSICAL EXAM  GENERAL: Very pleasant obese female in no acute distress.  HEENT: Normal  ABDOMEN: soft  EXTREMITIES: No edema   BACK: Unremarkable  SKIN: Normal by inspection.    MENTAL STATUS: Alert and oriented. Speech, language and cognition are generally intact. Judgment and insight normal.   CRANIAL NERVES: Pupils are equal, round and reactive to light and accomodation; extra ocular movements are full, there is no significant nystagmus; visual fields are full; upper and lower facial muscles are normal in strength and symmetric, there is no flattening of the nasolabial folds; tongue is midline; uvula is midline; shoulder elevation is normal.  MOTOR: Normal tone, bulk and strength; no pronator drift.  COORDINATION: Left finger to nose is normal, right finger to nose is normal, No rest tremor; no intention tremor; no postural tremor; no bradykinesia.  SENSATION: Normal to light touch.  GAIT: Not tested.   The brain MRI is present in person and shows a wedge-shaped  increased signal on DWI involving the inferior cerebellum on the left side. No acute hemorrhage is seen. There is chronic hemorrhage seen involving the right medial thalamic area. There is also chronic infarct involving left pontine region. There is minimal chronic periventricular white matter increased signal seen on T2/flair.   ASSESSMENT/PLAN [name] is a 56 y.o. female with history of obesity and hypertension presenting  with nausea, vomiting, vertigo, headache, and visual disturbance. She did not receive IV t-PA due to late presentation. MRI shows a inferior cerebellar infarct on the left side. There is also chronic right medial thalamic lacunar infarct with the chronic hemorrhage. There is also a chronic left pontine lacunar infarct. Physical therapy has been consulted to help with the patient's balance. A 30 day event monitor is suggested as this is the patient's third event although the previous 2 were lacunar infarcts this more recent event is not a lacunar event.  Stroke:  Acute infarct left inferior cerebellum without hemorrhage.  Resultant  ataxia and nausea and vomiting  CT head - Acute/subacute left cerebellar infarction.   MRI head - Acute infarct left inferior cerebellum without hemorrhage  MRA head - No large vessel occlusion.   Carotid Doppler - 1-39% ICA plaquing. Vertebral artery flow is antegrade.   2D Echo - EF 65-70%. No cardiac source of emboli identified.  LDL - 107  HgbA1c pending  VTE prophylaxis - SCDs  Diet Heart Room service appropriate? Yes; Fluid consistency: Thin  No antithrombotic prior to admission, now on No antithrombotic  Patient counseled to be compliant with her antithrombotic medications  Ongoing aggressive stroke risk factor management  Therapy recommendations: pending  Disposition: Pending  Hypertension  Blood pressure tends to run low at times  Permissive hypertension (OK if < 220/120) but gradually normalize in 5-7 days. Long-term blood pressure control is important especially given that this is her third MRI infarct.  Long-term BP goal normotensive  Hyperlipidemia  Home meds: No lipid lowering medications prior to admission  LDL 107, goal < 70  Add Lipitor 40 mg daily  Continue statin at discharge    Other Stroke Risk Factors  Cigarette smoker - advised to stop smoking  ETOH use, advised to drink no more than 1 drink per day  Obesity,  Body mass index is 48.11 kg/m., recommend weight loss, diet and exercise as appropriate    Other Active Problems  Mild hyperglycemia - await hemoglobin A1c  MRI does not show acute bleed and therefore the patient will be started on aspirin. She'll be started on 325 aspirin.  Phentermine PTA   Hospital day # 1    To contact Stroke Continuity provider, please refer to [email or web address] After hours, contact General Neurology

## 2017-05-19 DIAGNOSIS — E785 Hyperlipidemia, unspecified: Secondary | ICD-10-CM

## 2017-05-19 DIAGNOSIS — Z720 Tobacco use: Secondary | ICD-10-CM

## 2017-05-19 LAB — BASIC METABOLIC PANEL
Anion gap: 7 (ref 5–15)
BUN: 14 mg/dL (ref 6–20)
CHLORIDE: 105 mmol/L (ref 101–111)
CO2: 25 mmol/L (ref 22–32)
CREATININE: 0.87 mg/dL (ref 0.44–1.00)
Calcium: 10.1 mg/dL (ref 8.9–10.3)
GFR calc Af Amer: >60 mL/min (ref >60)
GFR calc non Af Amer: >60 mL/min (ref >60)
GLUCOSE: 103 mg/dL — AB (ref 65–99)
Potassium: 4 mmol/L (ref 3.5–5.1)
Sodium: 137 mmol/L (ref 135–145)

## 2017-05-19 LAB — VAS US CAROTID
LCCAPDIAS: 15 cm/s
LCCAPSYS: 71 cm/s
LEFT ECA DIAS: -4 cm/s
LEFT VERTEBRAL DIAS: -15 cm/s
LICADDIAS: -28 cm/s
LICAPSYS: -88 cm/s
Left CCA dist dias: -24 cm/s
Left CCA dist sys: -59 cm/s
Left ICA dist sys: -72 cm/s
Left ICA prox dias: -33 cm/s
RCCAPDIAS: -20 cm/s
RIGHT ECA DIAS: -27 cm/s
RIGHT VERTEBRAL DIAS: -11 cm/s
Right CCA prox sys: -76 cm/s
Right cca dist sys: -74 cm/s

## 2017-05-19 LAB — CBC
HCT: 38.8 % (ref 36.0–46.0)
HEMOGLOBIN: 12.1 g/dL (ref 12.0–15.0)
MCH: 27.1 pg (ref 26.0–34.0)
MCHC: 31.2 g/dL (ref 30.0–36.0)
MCV: 87 fL (ref 78.0–100.0)
PLATELETS: 161 10*3/uL (ref 150–400)
RBC: 4.46 MIL/uL (ref 3.87–5.11)
RDW: 14.1 % (ref 11.5–15.5)
WBC: 7.7 10*3/uL (ref 4.0–10.5)

## 2017-05-19 LAB — GLUCOSE, CAPILLARY: Glucose-Capillary: 95 mg/dL (ref 65–99)

## 2017-05-19 LAB — HEMOGLOBIN A1C
Hgb A1c MFr Bld: 5.6 % (ref 4.8–5.6)
Mean Plasma Glucose: 114 mg/dL

## 2017-05-19 MED ORDER — NICOTINE 21 MG/24HR TD PT24: 21.0000 mg | MEDICATED_PATCH | TRANSDERMAL | 1 refills | Status: DC

## 2017-05-19 MED ORDER — MECLIZINE HCL 12.5 MG PO TABS: 12.5000 mg | ORAL_TABLET | Freq: Three times a day (TID) | ORAL | 0 refills | Status: DC

## 2017-05-19 MED ORDER — ASPIRIN 325 MG PO TABS: 325.0000 mg | ORAL_TABLET | Freq: Every day | ORAL | 1 refills | Status: DC

## 2017-05-19 MED ORDER — ATORVASTATIN CALCIUM 40 MG PO TABS: 40.0000 mg | ORAL_TABLET | Freq: Every day | ORAL | 1 refills | Status: DC

## 2017-05-19 NOTE — Discharge Summary (Signed)
Physician Discharge Summary  Kathleen [name] AUQ:[id number] DOB: 08/14/1961 DOA: 05/17/2017  PCP: Alfonse Spruce, FNP  Admit date: 05/17/2017 Discharge date: 05/19/2017  Time spent: 35 minutes  Recommendations for Outpatient Follow-up:  1. Repeat BMET to follow electrolytes and renal function 2. Reassess blood pressure and adjust medication as needed 3. Assure patient compliance with aspirin and hyperlipidemic medication 4. Repeat liver function test and lipid panel in 4-6 weeks; adjust medication regimen as needed   Discharge Diagnoses:  Principal Problem:   Cerebral embolism with cerebral infarction Active Problems:   Hypertension   CVA (cerebral vascular accident) ([place])   Nausea & vomiting   Obesity, Class III, BMI 40-49.9 (morbid obesity) ([place])   Hyperlipidemia   Tobacco abuse alcohol use   Discharge Condition: Stable and improved. Patient having discharged home with instructions to follow with PCP in the next 10 days. Will also follow with neurology as an outpatient. She will have event monitoring by neurology service and was advised to be compliant with her statins and aspirin.  Diet recommendation: Heart healthy and low calorie diet  Filed Weights   05/17/17 1110 05/17/17 2043  Weight: 127.9 kg (282 lb) 135.2 kg (298 lb 1 oz)    Brief History of present illness:  56 y/o female with PMH significant for HTN, obesity and tobacco abuse; who presented with HA's, nausea, dizziness and ataxia. Found to have acute/subacute cerebellum ischemic stroke.  Hospital Course:  1-subacute cerebellum stroke: patient with dizziness, vertigo, HA's and nausea -will discharge on ASA for secondary prevention -patient will also use lipitor for control of LDL -will start low dose TID of meclizine to assist with dizziness sensation -PT/OT evaluated patient and provide rec's for outpatient rehabilitation (PT and OT). -HA resolved, still with some lightheadedness sensation and feeling off  balance. No nausea, no vomiting, no other focal neurologic deficit.  -30 days event monitoring to be arranged as an outpatient as per neurology recommendations.  2-HTN -BP is stable and rising -Advised to follow a heart healthy diet and will resume home antihypertensive regimen.  3-obesity -Body mass index is 48.11 kg/m. -low calorie diet and exercise discussed with patient  4-HLD -will use statins  5-tobacco abuse -cessation counseling provided -nicotine patch prescribed at discharge  6-alcohol use -advise not to have more than 3 drinks per week.  Procedures:  See below for x-ray reports  2-D echo: grade 1 diastolic HF, no source of emboli and preserved EF  Carotid duplex: bilateral 1-39 ICA steonsis and antegrade flow of vertebral arteries.  Consultations:  Neurology  Discharge Exam: Vitals:   05/19/17 0800 05/19/17 1000  BP: (!) 141/78 123/88  Pulse: 84 60  Resp:  16  Temp:  98.2 F (36.8 C)    General:  Afebrile, no CP, no SOB. Still feeling dizzy/woozy when changing positions. No further episodes of nausea or vomiting.  Cardiovascular: no rubs, no gallops, no jvd. S1 and S2 appreciated.  Respiratory: no wheezing, no crackles, good air movement  Abdomen: soft, NT, ND positive BS  Musculoskeletal: no edema or cyanosis, no clubbing  Neurologic exam: no dysarthria, complaining of dizziness and off balance Feeling. Normal muscle bulk   Discharge Instructions   Discharge Instructions    Ambulatory referral to Occupational Therapy    Complete by:  As directed    Ambulatory referral to Physical Therapy    Complete by:  As directed    Diet - low sodium heart healthy    Complete by:  As directed  Discharge instructions    Complete by:  As directed    Stop smoking Limit alcohol intake to less than 3 drinks per week Follow a heart healthy diet and low calorie diet in order to Lose weight Maintain adequate hydration Follow-up with neurology and  with scheduled to service as instructed Please arrange follow-up with her PCP in the next 10 days   Increase activity slowly    Complete by:  As directed      Current Discharge Medication List    START taking these medications   Details  aspirin 325 MG tablet Take 1 tablet (325 mg total) by mouth daily. Qty: 30 tablet, Refills: 1    atorvastatin (LIPITOR) 40 MG tablet Take 1 tablet (40 mg total) by mouth daily at 6 PM. Qty: 30 tablet, Refills: 1    meclizine (ANTIVERT) 12.5 MG tablet Take 1 tablet (12.5 mg total) by mouth 3 (three) times daily. Qty: 90 tablet, Refills: 0    nicotine (NICODERM CQ - DOSED IN MG/24 HOURS) 21 mg/24hr patch Place 1 patch (21 mg total) onto the skin daily. Qty: 30 patch, Refills: 1      CONTINUE these medications which have NOT CHANGED   Details  albuterol (PROVENTIL HFA;VENTOLIN HFA) 108 (90 BASE) MCG/ACT inhaler Inhale 2 puffs into the lungs every 4 (four) hours as needed for wheezing or shortness of breath. Qty: 1 Inhaler, Refills: 0    hydrochlorothiazide (HYDRODIURIL) 25 MG tablet TAKE 1 TABLET BY MOUTH DAILY Qty: 30 tablet, Refills: 0   Associated Diagnoses: Malignant hypertension    meloxicam (MOBIC) 7.5 MG tablet Take 1 tablet (7.5 mg total) by mouth daily. Qty: 40 tablet, Refills: 0   Associated Diagnoses: Osteoarthritis of both knees, unspecified osteoarthritis type    ondansetron (ZOFRAN) 4 MG tablet Take 1 tablet (4 mg total) by mouth every 8 (eight) hours as needed for nausea or vomiting. Qty: 20 tablet, Refills: 0    valsartan (DIOVAN) 160 MG tablet TAKE 1 TABLET BY MOUTH DAILY. Qty: 30 tablet, Refills: 0   Associated Diagnoses: Malignant hypertension    Vitamin D, Ergocalciferol, (DRISDOL) 50000 units CAPS capsule Take 50,000 Units by mouth once a week.  Refills: 2      STOP taking these medications     cyclobenzaprine (FLEXERIL) 10 MG tablet      ibuprofen (ADVIL,MOTRIN) 600 MG tablet      phentermine 15 MG capsule         Allergies  Allergen Reactions  . Hydrocodone Itching  . Lisinopril Cough  . Tramadol Itching   Follow-up Information    Alfonse Spruce, FNP. Schedule an appointment as soon as possible for a visit in 10 day(s).   Specialty:  Family Medicine Contact information: [address] [place] [address] [phone]           The results of significant diagnostics from this hospitalization (including imaging, microbiology, ancillary and laboratory) are listed below for reference.    Significant Diagnostic Studies: Dg Chest 2 View  Result Date: 05/17/2017 CLINICAL DATA:  Chest pain and dizziness EXAM: CHEST  2 VIEW COMPARISON:  March 30, 2015 FINDINGS: There is no edema or consolidation. Heart size and pulmonary vascularity are normal. No adenopathy. There is degenerative change in the thoracic spine. IMPRESSION: No edema or consolidation. Electronically Signed   By: Lowella Grip III M.D.   On: 05/17/2017 12:12   Ct Head Wo Contrast  Result Date: 05/17/2017 CLINICAL DATA:  Dizziness and balance disturbance  over the last 48 hours. EXAM: CT HEAD WITHOUT CONTRAST TECHNIQUE: Contiguous axial images were obtained from the base of the skull through the vertex without intravenous contrast. COMPARISON:  10/31/2013 FINDINGS: Brain: There is acute/subacute infarction affecting the left cerebellum. There may be petechial blood products but there is no frank hematoma. No mass effect. Cerebral hemispheres show mild chronic small-vessel change of the white matter without acute finding. No mass lesion, hydrocephalus or extra-axial collection. Vascular: No significant vascular finding. Skull: Negative Sinuses/Orbits: Clear/normal Other: None significant IMPRESSION: Acute/subacute left cerebellar infarction. Possible petechial blood products but no frank hematoma, mass effect or hydrocephalus. Mild chronic small-vessel change of the white matter. Electronically Signed   By: Nelson Chimes M.D.    On: 05/17/2017 14:32   Mr Jodene Nam Head Wo Contrast  Result Date: 05/18/2017 CLINICAL DATA:  Dizziness.  Stroke. EXAM: MRI HEAD WITHOUT CONTRAST MRA HEAD WITHOUT CONTRAST TECHNIQUE: Multiplanar, multiecho pulse sequences of the brain and surrounding structures were obtained without intravenous contrast. Angiographic images of the head were obtained using MRA technique without contrast. COMPARISON:  CT head 05/17/2017 FINDINGS: MRI HEAD FINDINGS Brain: Acute infarct in the left inferior cerebellum PICA territory. No other areas of acute infarct. No hemorrhage in the infarct. Ventricle size normal. Multiple small chronic white matter hyperintensities. Chronic ischemia in the pons left greater than right. Chronic microhemorrhage in the right medial thalamus. Negative for mass lesion. Vascular: Normal arterial flow voids Skull and upper cervical spine: Negative Sinuses/Orbits: Mild mucosal edema in the paranasal sinuses. Normal orbit. Other: None MRA HEAD FINDINGS Internal carotid artery widely patent bilaterally. Anterior and middle cerebral arteries are patent bilaterally. Mild atherosclerotic disease in the anterior cerebral arteries bilaterally. Mild atherosclerotic disease right middle cerebral artery branches. No large vessel occlusion Both vertebral arteries patent to the basilar. Basilar widely patent. Superior cerebellar and posterior cerebral arteries patent bilaterally Negative for cerebral aneurysm IMPRESSION: Acute infarct left inferior cerebellum without hemorrhage Chronic microhemorrhage right medial thalamus Mild intracranial atherosclerotic disease in the anterior cerebral arteries and right middle cerebral artery. No large vessel occlusion. Electronically Signed   By: Franchot Gallo M.D.   On: 05/18/2017 08:15   Mr Brain Wo Contrast  Result Date: 05/18/2017 CLINICAL DATA:  Dizziness.  Stroke. EXAM: MRI HEAD WITHOUT CONTRAST MRA HEAD WITHOUT CONTRAST TECHNIQUE: Multiplanar, multiecho pulse  sequences of the brain and surrounding structures were obtained without intravenous contrast. Angiographic images of the head were obtained using MRA technique without contrast. COMPARISON:  CT head 05/17/2017 FINDINGS: MRI HEAD FINDINGS Brain: Acute infarct in the left inferior cerebellum PICA territory. No other areas of acute infarct. No hemorrhage in the infarct. Ventricle size normal. Multiple small chronic white matter hyperintensities. Chronic ischemia in the pons left greater than right. Chronic microhemorrhage in the right medial thalamus. Negative for mass lesion. Vascular: Normal arterial flow voids Skull and upper cervical spine: Negative Sinuses/Orbits: Mild mucosal edema in the paranasal sinuses. Normal orbit. Other: None MRA HEAD FINDINGS Internal carotid artery widely patent bilaterally. Anterior and middle cerebral arteries are patent bilaterally. Mild atherosclerotic disease in the anterior cerebral arteries bilaterally. Mild atherosclerotic disease right middle cerebral artery branches. No large vessel occlusion Both vertebral arteries patent to the basilar. Basilar widely patent. Superior cerebellar and posterior cerebral arteries patent bilaterally Negative for cerebral aneurysm IMPRESSION: Acute infarct left inferior cerebellum without hemorrhage Chronic microhemorrhage right medial thalamus Mild intracranial atherosclerotic disease in the anterior cerebral arteries and right middle cerebral artery. No large vessel occlusion. Electronically Signed  By: Franchot Gallo M.D.   On: 05/18/2017 08:15    Microbiology: No results found for this or any previous visit (from the past 240 hour(s)).   Labs: Basic Metabolic Panel:  Recent Labs Lab 05/16/17 1653 05/17/17 1110 05/19/17 0450  NA 142 136 137  K 3.6 3.7 4.0  CL 101 101 105  CO2 25 26 25   GLUCOSE 112* 121* 103*  BUN 9 8 14   CREATININE 0.75 0.90 0.87  CALCIUM 10.8* 11.0* 10.1   Liver Function Tests:  Recent Labs Lab  05/16/17 1653 05/17/17 1110  AST 23 26  ALT 17 23  ALKPHOS 79 69  BILITOT 0.3 0.7  PROT 7.0 7.4  ALBUMIN 4.3 3.9    Recent Labs Lab 05/16/17 1653 05/17/17 1110  LIPASE 19 26  AMYLASE 47  --    CBC:  Recent Labs Lab 05/16/17 1653 05/17/17 1110 05/19/17 0450  WBC 7.6 9.3 7.7  NEUTROABS 5.0  --   --   HGB 13.6 13.7 12.1  HCT 41.0 44.8 38.8  MCV 83 87.0 87.0  PLT 194 197 161   Cardiac Enzymes:  Recent Labs Lab 05/17/17 1952 05/18/17 0114 05/18/17 0650  CKTOTAL 69  --   --   TROPONINI <0.03 <0.03 <0.03    CBG:  Recent Labs Lab 05/18/17 0621 05/18/17 1611 05/19/17 1151  GLUCAP 107* 121* 95   Signed:  Barton Dubois MD.  [facility] 05/19/2017, 3:05 PM

## 2017-05-19 NOTE — Progress Notes (Signed)
Discharge instructions given to patient. All questions and concerns addressed.

## 2017-05-19 NOTE — Evaluation (Signed)
Occupational Therapy Evaluation Patient Details Name: Kathleen [name] MRN: [id number] DOB: 09/13/1961 Today's Date: 05/19/2017    History of Present Illness Pt is a 56 yo female admitted to the ED for three day history of nausea, vomiting, frontal headaches, and dizziness. CT revealed acute/subacute left cerebellar infarction.    Clinical Impression   Pt was independent in self care prior to admission. Presents with generalized weakness, poor standing balance, decreased activity tolerance and impaired safety awareness during ambulation. Pt with ataxic quality to gait. She requires min to min guard assist for ADL. Educated pt in benefits and use of tub bench and 3 in 1. Will follow acutely.     Follow Up Recommendations  Outpatient OT    Equipment Recommendations  3 in 1 bedside commode;Tub/shower bench    Recommendations for Other Services       Precautions / Restrictions Precautions Precautions: Fall Restrictions Weight Bearing Restrictions: No      Mobility Bed Mobility               General bed mobility comments: Received in chair   Transfers Overall transfer level: Needs assistance Equipment used: Rolling walker (2 wheeled) Transfers: Sit to/from Stand Sit to Stand: Min guard         General transfer comment: Min guard for safety due to dizziness and falls risk     Balance Overall balance assessment: Needs assistance Sitting-balance support: No upper extremity supported;Feet supported Sitting balance-Leahy Scale: Good     Standing balance support: Bilateral upper extremity supported;During functional activity Standing balance-Leahy Scale: Poor Standing balance comment: Relies on RW for support                           ADL either performed or assessed with clinical judgement   ADL Overall ADL's : Needs assistance/impaired Eating/Feeding: Independent;Sitting   Grooming: Min guard;Standing   Upper Body Bathing: Sitting;Set up   Lower  Body Bathing: Min guard;Sit to/from stand   Upper Body Dressing : Minimal assistance;Sitting   Lower Body Dressing: Min guard Lower Body Dressing Details (indicate cue type and reason): pt able to don socks crossing foot over opposite knee Toilet Transfer: Minimal assistance;Ambulation;BSC;RW   Toileting- Water quality scientist and Hygiene: Min guard;Sit to/from stand       Functional mobility during ADLs: Minimal assistance;Rolling walker;Cueing for safety       Vision Patient Visual Report: No change from baseline       Perception     Praxis      Pertinent Vitals/Pain Pain Assessment: No/denies pain     Hand Dominance Right   Extremity/Trunk Assessment Upper Extremity Assessment Upper Extremity Assessment: Generalized weakness   Lower Extremity Assessment Lower Extremity Assessment: Defer to PT evaluation RLE Deficits / Details:         Communication Communication Communication: No difficulties   Cognition Arousal/Alertness: Awake/alert Behavior During Therapy: WFL for tasks assessed/performed Overall Cognitive Status: Impaired/Different from baseline Area of Impairment: Safety/judgement                         Safety/Judgement: Decreased awareness of safety     General Comments: pt releasing walker and reaching for rail during ambulation in hall    General Comments       Exercises     Shoulder Instructions      Home Living Family/patient expects to be discharged to:: Private residence Living Arrangements: Spouse/significant other Available Help  at Discharge: Family Type of Home: House Home Access: Stairs to enter Technical brewer of Steps: 3 Entrance Stairs-Rails: None Home Layout: One level     Bathroom Shower/Tub: Teacher, early years/pre: [place] - single point          Prior Functioning/Environment Level of Independence: Independent        Comments: Pt was working part time  and independent with ADLs and mobility        OT Problem List: Decreased strength;Decreased activity tolerance;Impaired balance (sitting and/or standing);Decreased coordination;Decreased knowledge of use of DME or AE;Decreased safety awareness;Obesity      OT Treatment/Interventions: Self-care/ADL training;DME and/or AE instruction;Patient/family education;Balance training;Therapeutic activities    OT Goals(Current goals can be found in the care plan section) Acute Rehab OT Goals Patient Stated Goal: to go home OT Goal Formulation: With patient Time For Goal Achievement: 06/02/17 Potential to Achieve Goals: Good ADL Goals Pt Will Perform Grooming: with supervision;standing Pt Will Perform Lower Body Bathing: with supervision;sit to/from stand Pt Will Perform Lower Body Dressing: with supervision;sit to/from stand Pt Will Transfer to Toilet: with supervision;ambulating;bedside commode (over toilet) Pt Will Perform Toileting - Clothing Manipulation and hygiene: with supervision;sit to/from stand Pt Will Perform Tub/Shower Transfer: Tub transfer;with supervision;ambulating;tub bench;rolling walker  OT Frequency: Min 2X/week   Barriers to D/C:            Co-evaluation   Reason for Co-Treatment: For patient/therapist safety PT goals addressed during session: Mobility/safety with mobility;Balance;Proper use of DME        AM-PAC PT "6 Clicks" Daily Activity     Outcome Measure Help from another person eating meals?: None Help from another person taking care of personal grooming?: A Little Help from another person toileting, which includes using toliet, bedpan, or urinal?: A Little Help from another person bathing (including washing, rinsing, drying)?: A Little Help from another person to put on and taking off regular upper body clothing?: A Little Help from another person to put on and taking off regular lower body clothing?: A Little 6 Click Score: 19   End of Session  Equipment Utilized During Treatment: Rolling walker;Gait belt Nurse Communication:  (IV leaking fluid)  Activity Tolerance: Patient tolerated treatment well Patient left: in chair;with call bell/phone within reach;with chair alarm set  OT Visit Diagnosis: Unsteadiness on feet (R26.81);Muscle weakness (generalized) (M62.81)                Time: 8850-2774 OT Time Calculation (min): 15 min Charges:  OT General Charges $OT Visit: 1 Procedure OT Evaluation $OT Eval Moderate Complexity: 1 Procedure G-Codes:     Malka So 05/19/2017, 9:29 AM  [phone]

## 2017-05-19 NOTE — Progress Notes (Addendum)
STROKE TEAM PROGRESS NOTE   HISTORY OF PRESENT ILLNESS (per record) Kathleen [name] is an 56 y.o. female who presented to the ED with nausea and vomiting. Symptoms began on Wednesday with vertigo. She also had some visual problems. Vertigo has persisted since Wednesday. She has no prior history of vertigo. Also with an unusual sensation to the back of her head, in addition to a 7/10 frontal headache. Endorses poor appetite and fluid intake due to the nausea, with vomiting induced by oral intake. .   SUBJECTIVE (INTERVAL HISTORY) Patient still complains of significant dizziness and gait ataxia. The patient reports feeling fatigued and dressed today. I think it is mostly, probably meclizine use for dizziness. She also complains of some drowsiness which I think is coming from the medication. She reports having some baseline issues with insomnia and snoring. I told that she needs to be evaluated for sleep apnea later. She reports having some word finding difficulties/dysarthria that's mild to moderate.  OBJECTIVE Temp:  [98.2 F (36.8 C)-98.4 F (36.9 C)] 98.4 F (36.9 C) (06/17 0500) Pulse Rate:  [62-74] 74 (06/17 0500) Cardiac Rhythm: Normal sinus rhythm (06/17 0700) Resp:  [20] 20 (06/17 0500) BP: (99-129)/(72-82) 129/79 (06/17 0500) SpO2:  [96 %-100 %] 96 % (06/17 0500)  CBC:   Recent Labs Lab 05/16/17 1653 05/17/17 1110 05/19/17 0450  WBC 7.6 9.3 7.7  NEUTROABS 5.0  --   --   HGB 13.6 13.7 12.1  HCT 41.0 44.8 38.8  MCV 83 87.0 87.0  PLT 194 197 063    Basic Metabolic Panel:   Recent Labs Lab 05/17/17 1110 05/19/17 0450  NA 136 137  K 3.7 4.0  CL 101 105  CO2 26 25  GLUCOSE 121* 103*  BUN 8 14  CREATININE 0.90 0.87  CALCIUM 11.0* 10.1    Lipid Panel:     Component Value Date/Time   CHOL 176 05/18/2017 0114   TRIG 82 05/18/2017 0114   HDL 53 05/18/2017 0114   CHOLHDL 3.3 05/18/2017 0114   VLDL 16 05/18/2017 0114   LDLCALC 107 (H) 05/18/2017 0114   HgbA1c:  No results found for: HGBA1C Urine Drug Screen: No results found for: LABOPIA, COCAINSCRNUR, LABBENZ, AMPHETMU, THCU, LABBARB  Alcohol Level No results found for: [facility]  IMAGING  Dg Chest 2 View 05/17/2017 No edema or consolidation.   Ct Head Wo Contrast 05/17/2017 Acute/subacute left cerebellar infarction. Possible petechial blood products but no frank hematoma, mass effect or hydrocephalus. Mild chronic small-vessel change of the white matter.    Mr Jodene Nam Head Wo Contrast 05/18/2017 Acute infarct left inferior cerebellum without hemorrhage Chronic microhemorrhage right medial thalamus Mild intracranial atherosclerotic disease in the anterior cerebral arteries and right middle cerebral artery. No large vessel occlusion.    PHYSICAL EXAM  GENERAL: Very pleasant obese female in no acute distress.  HEENT: Normal  ABDOMEN: soft  EXTREMITIES: No edema   BACK: Unremarkable  SKIN: Normal by inspection.    MENTAL STATUS: Alert and oriented. Speech - intermittent mild dysarthria, language and cognition are generally intact. Judgment and insight normal.   CRANIAL NERVES: Pupils are equal, round and reactive to light and accomodation; extra ocular movements are full, there is no significant nystagmus; visual fields are full; upper and lower facial muscles are normal in strength and symmetric, there is no flattening of the nasolabial folds; tongue is midline; uvula is midline; shoulder elevation is normal.  MOTOR: Normal tone, bulk and strength; no pronator drift.  COORDINATION: Left  finger to nose is normal, right finger to nose is normal, No rest tremor; no intention tremor; no postural tremor; no bradykinesia.  SENSATION: Normal to light touch.  GAIT: Not tested.   The brain MRI is present in person and shows a wedge-shaped increased signal on DWI involving the inferior cerebellum on the left side. No acute hemorrhage is seen. There is chronic hemorrhage seen involving the right  medial thalamic area. There is also chronic infarct involving left pontine region. There is minimal chronic periventricular white matter increased signal seen on T2/flair.   ASSESSMENT/PLAN Kathleen [name] is a 56 y.o. female with history of obesity and hypertension presenting with nausea, vomiting, vertigo, headache, and visual disturbance. She did not receive IV t-PA due to late presentation. MRI shows a inferior cerebellar infarct on the left side. There is also chronic right medial thalamic lacunar infarct with the chronic hemorrhage. There is also a chronic left pontine lacunar infarct. Physical therapy has been consulted to help with the patient's balance. A 30 day event monitor is suggested as this is the patient's third event although the previous 2 were lacunar infarcts this more recent event is not a lacunar event. A discharge from our standpoint as a workup was done. She is to get follow-up in the stroke clinic in about 2 months.   Stroke:  Acute infarct left inferior cerebellum without hemorrhage.  Resultant  ataxia and nausea and vomiting  CT head - Acute/subacute left cerebellar infarction.   MRI head - Acute infarct left inferior cerebellum without hemorrhage  MRA head - No large vessel occlusion.   Carotid Doppler - 1-39% ICA plaquing. Vertebral artery flow is antegrade.   2D Echo - EF 65-70%. No cardiac source of emboli identified.  LDL - 107  HgbA1c - 5.6  VTE prophylaxis - SCDs Diet Heart Room service appropriate? Yes; Fluid consistency: Thin  No antithrombotic prior to admission, now on No antithrombotic  Patient counseled to be compliant with her antithrombotic medications  Ongoing aggressive stroke risk factor management  Therapy recommendations: Outpatient OT recommended. PT evaluation pending.  Disposition: Pending  Hypertension  Blood pressure tends to run low at times  Permissive hypertension (OK if < 220/120) but gradually normalize in 5-7 days.  Long-term blood pressure control is important especially given that this is her third MRI infarct.  Long-term BP goal normotensive  Hyperlipidemia  Home meds: No lipid lowering medications prior to admission  LDL 107, goal < 70  Add Lipitor 40 mg daily  Continue statin at discharge    Other Stroke Risk Factors  Cigarette smoker - advised to stop smoking  ETOH use, advised to drink no more than 1 drink per day  Obesity, Body mass index is 48.11 kg/m., recommend weight loss, diet and exercise as appropriate   Outpatient polysomnography.   Other Active Problems  Mild hyperglycemia - await hemoglobin A1c  MRI does not show acute bleed and therefore the patient will be started on aspirin 325 mg daily.  Phentermine PTA   Hospital day # 2    To contact Stroke Continuity provider, please refer to [email or web address] After hours, contact General Neurology

## 2017-05-19 NOTE — Evaluation (Addendum)
Speech Language Pathology Evaluation Patient Details Name: Kathleen [name] MRN: [id number] DOB: 04/11/1961 Today's Date: 05/19/2017 Time:  -     Problem List:  Patient Active Problem List   Diagnosis Date Noted  . Obesity, Class III, BMI 40-49.9 (morbid obesity) ([place])   . Hyperlipidemia   . Tobacco abuse   . Cerebral embolism with cerebral infarction 05/17/2017  . CVA (cerebral vascular accident) ([place]) 05/17/2017  . Nausea & vomiting 05/17/2017  . Hypertension 05/16/2017  . DJD (degenerative joint disease) of knee 07/01/2014  . Bilateral knee pain 06/30/2014   Past Medical History:  Past Medical History:  Diagnosis Date  . Hypertension    NO MED MD TO GIVE RX  . Obesity    Past Surgical History:  Past Surgical History:  Procedure Laterality Date  . CARPAL TUNNEL RELEASE  01/12/2012   Procedure: CARPAL TUNNEL RELEASE;  Surgeon: Schuyler Amor, MD;  Location: [place];  Service: Orthopedics;  Laterality: Right;  . CESAREAN SECTION    . NOSE SURGERY     BROKEN   MANY YRS AGO   . TUBAL LIGATION    . WRIST FRACTURE SURGERY  Feb 2013   HPI:  Pt is a 56 yo female admitted to the ED for three day history of nausea, vomiting, frontal headaches, and dizziness. CT revealed acute/subacute left cerebellar infarction, MRI 6/16 acute infarct left inferior cerebellum without hemorrhage.   Assessment / Plan / Recommendation Clinical Impression  Patient presents with mild dysarthria most consistent with ataxic dysarthria, characterized by slow rate of speech, irregular articulatory breakdowns, imprecise consonants. Her speech is grossly intelligible in conversation to this trained listener, however pt reports difficulty expressing herself and is intermittently tearful. Suspect cognitive deficits in attention, memory; MD arrived during the examination and spoke with patient for several minutes. Pt was unable to recall his recommendations without verbal cues. Pt reports prior to admission she  was managing her household and working at [employer]. Recommend OP SLP f/u for dysarthria, further cognitive-linguistic assessment. No further acute needs identified, defer tx plan to OP f/u. SLP will s/o.     SLP Assessment  SLP Recommendation/Assessment: All further Speech Lanaguage Pathology  needs can be addressed in the next venue of care SLP Visit Diagnosis: Dysarthria and anarthria (R47.1);Cognitive communication deficit (R41.841)    Follow Up Recommendations  Outpatient SLP    Frequency and Duration           SLP Evaluation Cognition  Overall Cognitive Status: Impaired/Different from baseline Arousal/Alertness: Awake/alert Orientation Level: Oriented X4 Attention: Focused;Sustained Focused Attention: Appears intact Sustained Attention: Impaired Sustained Attention Impairment: Verbal basic;Functional basic Memory: Impaired Memory Impairment: Storage deficit;Decreased recall of new information       Comprehension  Auditory Comprehension Overall Auditory Comprehension: Appears within functional limits for tasks assessed Visual Recognition/Discrimination Discrimination: Not tested Reading Comprehension Reading Status: Not tested    Expression Expression Primary Mode of Expression: Verbal Verbal Expression Overall Verbal Expression: Impaired Initiation: No impairment Automatic Speech: Name;Social Response Level of Generative/Spontaneous Verbalization: Conversation Naming: Impairment Other Naming Comments: some hesitations, word-finding errors Pragmatics: No impairment Interfering Components: Attention Effective Techniques: Sentence completion Non-Verbal Means of Communication: Not applicable Written Expression Dominant Hand: Right Written Expression: Not tested   Oral / Motor  Oral Motor/Sensory Function Overall Oral Motor/Sensory Function: Within functional limits Motor Speech Overall Motor Speech: Impaired Respiration: Within functional  limits Phonation: Normal Resonance: Within functional limits Articulation: Impaired Level of Impairment: Sentence Intelligibility: Intelligibility reduced Word: 75-100% accurate  Phrase: 75-100% accurate Sentence: 75-100% accurate Conversation: 75-100% accurate Motor Planning: Impaired Level of Impairment: Insurance risk surveyor Errors: Inconsistent;Aware Effective Techniques: Slow rate   [place], [place], CCC-SLP Speech-Language Pathologist [phone]  Aliene Altes 05/19/2017, 5:37 PM

## 2017-05-19 NOTE — Evaluation (Signed)
Physical Therapy Evaluation Patient Details Name: Kathleen [name] MRN: [id number] DOB: 24-Jul-1961 Today's Date: 05/19/2017   History of Present Illness  Pt is a 56 yo female admitted to the ED for three day history of nausea, vomiting, frontal headaches, and dizziness. CT revealed acute/subacute left cerebellar infarction.   Clinical Impression  Pt presents with decreased functional mobility secondary to L cerebellar stroke. Prior to admission pt was independent and working part time, and would only use a SPC occasionally in the community.  Pt reports dizziness and fatigue today. Pt presents with ataxic gait requiring min Ceria Suminski-min A for stabilization and required multiple standing rest breaks throughout 47ft gait trial with RW. Pt would benefit from continued skilled PT to improve balance, activity tolerance, and strength in order to return to prior level of function. PT will follow acutely.     Follow Up Recommendations Outpatient PT;Supervision - Intermittent;Supervision for mobility/OOB    Equipment Recommendations  Rolling walker with 5" wheels    Recommendations for Other Services       Precautions / Restrictions Precautions Precautions: Fall Restrictions Weight Bearing Restrictions: No      Mobility  Bed Mobility               General bed mobility comments: Received in chair   Transfers Overall transfer level: Needs assistance Equipment used: Rolling walker (2 wheeled) Transfers: Sit to/from Stand Sit to Stand: Min Cam [place]         General transfer comment: Min Micharl Helmes for safety due to dizziness and falls risk   Ambulation/Gait Ambulation/Gait assistance: Min assist Ambulation Distance (Feet): 60 Feet Assistive device: Rolling walker (2 wheeled) Gait Pattern/deviations: Step-through pattern;Decreased stride length;Ataxic;Trunk flexed Gait velocity: decreased Gait velocity interpretation: Below normal speed for age/gender General Gait Details: Pt presents with  ataxic gait requiring min Delsa Walder-min A for stability. Pt required multiple standing rest breaks due to dizziness and fatigue. VCs provided to keep BUEs on walker.   Stairs            Wheelchair Mobility    Modified Rankin (Stroke Patients Only) Modified Rankin (Stroke Patients Only) Pre-Morbid Rankin Score: No symptoms Modified Rankin: Moderately severe disability     Balance Overall balance assessment: Needs assistance Sitting-balance support: No upper extremity supported;Feet supported Sitting balance-Leahy Scale: Good     Standing balance support: Bilateral upper extremity supported;During functional activity Standing balance-Leahy Scale: Poor Standing balance comment: Relies on RW for support                             Pertinent Vitals/Pain Pain Assessment: No/denies pain    Home Living Family/patient expects to be discharged to:: Private residence Living Arrangements: Spouse/significant other Available Help at Discharge: Family Type of Home: House Home Access: Stairs to enter Entrance Stairs-Rails: None Technical brewer of Steps: 3 Home Layout: One level Home Equipment: Cane - single point      Prior Function Level of Independence: Independent         Comments: Pt was working part time and independent with ADLs and mobility     Hand Dominance   Dominant Hand: Right    Extremity/Trunk Assessment   Upper Extremity Assessment Upper Extremity Assessment: Defer to OT evaluation    Lower Extremity Assessment Lower Extremity Assessment: Generalized weakness;RLE deficits/detail;LLE deficits/detail (generalized weakness L>R) RLE Deficits / Details:         Communication   Communication: No difficulties  Cognition Arousal/Alertness: Awake/alert Behavior  During Therapy: WFL for tasks assessed/performed Overall Cognitive Status: Within Functional Limits for tasks assessed                                 General  Comments: Pt reports feeling dizzy during session. Pt oriented x3 to person, place, and date.      General Comments      Exercises     Assessment/Plan    PT Assessment Patient needs continued PT services  PT Problem List Decreased strength;Decreased activity tolerance;Decreased balance;Decreased mobility;Decreased coordination;Decreased knowledge of use of DME;Decreased safety awareness;Obesity       PT Treatment Interventions DME instruction;Gait training;Stair training;Functional mobility training;Therapeutic activities;Therapeutic exercise;Balance training;Neuromuscular re-education;Patient/family education    PT Goals (Current goals can be found in the Care Plan section)  Acute Rehab PT Goals Patient Stated Goal: to go home PT Goal Formulation: With patient Time For Goal Achievement: 06/02/17 Potential to Achieve Goals: Good    Frequency Min 3X/week   Barriers to discharge        Co-evaluation PT/OT/SLP Co-Evaluation/Treatment: Yes Reason for Co-Treatment: Complexity of the patient's impairments (multi-system involvement);For patient/therapist safety PT goals addressed during session: Mobility/safety with mobility;Balance;Proper use of DME         AM-PAC PT "6 Clicks" Daily Activity  Outcome Measure Difficulty turning over in bed (including adjusting bedclothes, sheets and blankets)?: A Little Difficulty moving from lying on back to sitting on the side of the bed? : A Little Difficulty sitting down on and standing up from a chair with arms (e.g., wheelchair, bedside commode, etc,.)?: A Little Help needed moving to and from a bed to chair (including a wheelchair)?: A Little Help needed walking in hospital room?: A Little Help needed climbing 3-5 steps with a railing? : A Lot 6 Click Score: 17    End of Session Equipment Utilized During Treatment: Gait belt Activity Tolerance: Patient limited by fatigue Patient left: in chair;with call bell/phone within  reach;with chair alarm set Nurse Communication: Mobility status PT Visit Diagnosis: Unsteadiness on feet (R26.81);Other abnormalities of gait and mobility (R26.89);Ataxic gait (R26.0)    Time: 9179-1505 PT Time Calculation (min) (ACUTE ONLY): 24 min   Charges:   PT Evaluation $PT Eval Moderate Complexity: 1 Procedure     PT G CodesLoma Sousa, SPT  [phone]  Loma Sousa 05/19/2017, 9:16 AM

## 2017-05-20 LAB — HIV ANTIBODY (ROUTINE TESTING W REFLEX): HIV Screen 4th Generation wRfx: NONREACTIVE

## 2017-05-23 ENCOUNTER — Ambulatory Visit (INDEPENDENT_AMBULATORY_CARE_PROVIDER_SITE_OTHER): Payer: Medicare Other

## 2017-05-23 DIAGNOSIS — I639 Cerebral infarction, unspecified: Secondary | ICD-10-CM

## 2017-05-23 DIAGNOSIS — I4891 Unspecified atrial fibrillation: Secondary | ICD-10-CM | POA: Diagnosis not present

## 2017-05-25 LAB — SPECIMEN STATUS REPORT

## 2017-05-28 ENCOUNTER — Other Ambulatory Visit: Payer: Self-pay | Admitting: Family Medicine

## 2017-05-29 LAB — SPECIMEN STATUS REPORT

## 2017-05-29 LAB — HGB A1C W/O EAG: Hgb A1c MFr Bld: 5.6 % (ref 4.8–5.6)

## 2017-06-03 ENCOUNTER — Telehealth: Payer: Self-pay | Admitting: Family Medicine

## 2017-06-03 DIAGNOSIS — I10 Essential (primary) hypertension: Secondary | ICD-10-CM

## 2017-06-03 MED ORDER — VALSARTAN 160 MG PO TABS: 160.0000 mg | ORAL_TABLET | Freq: Every day | ORAL | 0 refills | Status: DC

## 2017-06-03 MED ORDER — HYDROCHLOROTHIAZIDE 25 MG PO TABS: 25.0000 mg | ORAL_TABLET | Freq: Every day | ORAL | 0 refills | Status: DC

## 2017-06-03 NOTE — Telephone Encounter (Signed)
Refilled requested meds x 30 days, patient to follow up with [facility]

## 2017-06-03 NOTE — Telephone Encounter (Signed)
Pt. Called requesting a refill on the following medications:  valsartan (DIOVAN) 160 MG tablet \  hydrochlorothiazide (HYDRODIURIL) 25 MG tablet   Pt. Uses [facility]. Please f/u

## 2017-06-18 ENCOUNTER — Telehealth: Payer: Self-pay | Admitting: Family Medicine

## 2017-06-18 NOTE — Telephone Encounter (Signed)
Pt called to request a refill for meclizine (ANTIVERT) 12.5 MG tablet   please follow up

## 2017-06-19 ENCOUNTER — Other Ambulatory Visit: Payer: Self-pay | Admitting: Family Medicine

## 2017-06-19 ENCOUNTER — Encounter: Payer: Self-pay | Admitting: Family Medicine

## 2017-06-19 ENCOUNTER — Ambulatory Visit: Payer: Medicare Other | Attending: Family Medicine | Admitting: Family Medicine

## 2017-06-19 VITALS — BP 108/75 | HR 93 | Temp 98.4°F | Resp 18 | Ht 66.0 in | Wt 292.6 lb

## 2017-06-19 DIAGNOSIS — E669 Obesity, unspecified: Secondary | ICD-10-CM | POA: Insufficient documentation

## 2017-06-19 DIAGNOSIS — I6930 Unspecified sequelae of cerebral infarction: Secondary | ICD-10-CM | POA: Diagnosis not present

## 2017-06-19 DIAGNOSIS — I10 Essential (primary) hypertension: Secondary | ICD-10-CM | POA: Insufficient documentation

## 2017-06-19 DIAGNOSIS — Z8673 Personal history of transient ischemic attack (TIA), and cerebral infarction without residual deficits: Secondary | ICD-10-CM | POA: Diagnosis not present

## 2017-06-19 DIAGNOSIS — M7989 Other specified soft tissue disorders: Secondary | ICD-10-CM | POA: Diagnosis not present

## 2017-06-19 DIAGNOSIS — G4700 Insomnia, unspecified: Secondary | ICD-10-CM | POA: Insufficient documentation

## 2017-06-19 DIAGNOSIS — R5383 Other fatigue: Secondary | ICD-10-CM | POA: Diagnosis not present

## 2017-06-19 DIAGNOSIS — Z7982 Long term (current) use of aspirin: Secondary | ICD-10-CM | POA: Diagnosis not present

## 2017-06-19 DIAGNOSIS — M1711 Unilateral primary osteoarthritis, right knee: Secondary | ICD-10-CM | POA: Insufficient documentation

## 2017-06-19 DIAGNOSIS — R531 Weakness: Secondary | ICD-10-CM | POA: Insufficient documentation

## 2017-06-19 DIAGNOSIS — Z79899 Other long term (current) drug therapy: Secondary | ICD-10-CM | POA: Insufficient documentation

## 2017-06-19 DIAGNOSIS — M170 Bilateral primary osteoarthritis of knee: Secondary | ICD-10-CM | POA: Diagnosis not present

## 2017-06-19 DIAGNOSIS — I639 Cerebral infarction, unspecified: Secondary | ICD-10-CM

## 2017-06-19 DIAGNOSIS — Z09 Encounter for follow-up examination after completed treatment for conditions other than malignant neoplasm: Secondary | ICD-10-CM | POA: Diagnosis not present

## 2017-06-19 DIAGNOSIS — R42 Dizziness and giddiness: Secondary | ICD-10-CM | POA: Insufficient documentation

## 2017-06-19 MED ORDER — MELOXICAM 7.5 MG PO TABS: 7.5000 mg | ORAL_TABLET | Freq: Every day | ORAL | 0 refills | Status: DC

## 2017-06-19 MED ORDER — TRAZODONE HCL 50 MG PO TABS: 25.0000 mg | ORAL_TABLET | Freq: Every evening | ORAL | 0 refills | Status: DC | PRN

## 2017-06-19 NOTE — Telephone Encounter (Signed)
Pt called to request a refill for meclizine (ANTIVERT) 12.5 MG tablet   please advice?

## 2017-06-19 NOTE — Telephone Encounter (Signed)
Refill sent.

## 2017-06-19 NOTE — Progress Notes (Signed)
Patient is here for cant sleep need refill on meclizine patient need referral to weight loss clinic therapy   Patient has a slight headache

## 2017-06-19 NOTE — Progress Notes (Signed)
Subjective:  Patient ID: Kathleen [name], female    DOB: 1961/09/15  Age: 56 y.o. MRN: [id number]  CC: Hospitalization Follow-up   HPI Kathleen [name] presents for Hospital discharge follow-up. History of hospitalization 05/17/2017-05/19/2017 for CVA. Past medical history includes hypertension, obesity, and cigarette smoking. She denies any vision disturbances. She does report generalized weakness, fatigue,and  insomnia. Reports averaging 3-4 hours of sleep. PT/OT services recommended outpatient.  Reports weight gain 10 pounds since hospitalization.Denies any chest pain, shortness of breath, swelling of the bilateral lower extremities, or orthopnea. She receives care from bariatric clinic. She currently has neurology event monitor in place however she did not have a follow-up appointment with neurology at this time. Episodes of dizziness she reports taking meclizine for improvement of symptoms. She reports history of [name] lower extremity leg swelling about 1 week ago. She denies any history of injury. Associated symptoms include swelling and tenderness. She denies any temperature change or color change to the extremity. Patient reports concern for gout. Risk factors include diet and history of  alcohol use.  Outpatient Medications Prior to Visit  Medication Sig Dispense Refill  . albuterol (PROVENTIL HFA;VENTOLIN HFA) 108 (90 BASE) MCG/ACT inhaler Inhale 2 puffs into the lungs every 4 (four) hours as needed for wheezing or shortness of breath. 1 Inhaler 0  . aspirin 325 MG tablet Take 1 tablet (325 mg total) by mouth daily. 30 tablet 1  . atorvastatin (LIPITOR) 40 MG tablet Take 1 tablet (40 mg total) by mouth daily at 6 PM. 30 tablet 1  . hydrochlorothiazide (HYDRODIURIL) 25 MG tablet Take 1 tablet (25 mg total) by mouth daily. 30 tablet 0  . meclizine (ANTIVERT) 12.5 MG tablet Take 1 tablet (12.5 mg total) by mouth 3 (three) times daily. 90 tablet 0  . nicotine (NICODERM CQ - DOSED IN MG/24 HOURS)  21 mg/24hr patch Place 1 patch (21 mg total) onto the skin daily. 30 patch 1  . ondansetron (ZOFRAN) 4 MG tablet Take 1 tablet (4 mg total) by mouth every 8 (eight) hours as needed for nausea or vomiting. 20 tablet 0  . valsartan (DIOVAN) 160 MG tablet Take 1 tablet (160 mg total) by mouth daily. 30 tablet 0  . Vitamin D, Ergocalciferol, (DRISDOL) 50000 units CAPS capsule Take 50,000 Units by mouth once a week.   2  . meloxicam (MOBIC) 7.5 MG tablet Take 1 tablet (7.5 mg total) by mouth daily. 40 tablet 0   No facility-administered medications prior to visit.     ROS Review of Systems  Constitutional: Positive for fatigue.  HENT: Negative.   Eyes: Negative.   Respiratory: Negative.   Cardiovascular: Positive for leg swelling ([name] lower leg).  Musculoskeletal: Positive for myalgias.       Generalized weakness  Skin: Negative.   Neurological: Negative.   Psychiatric/Behavioral: Positive for sleep disturbance. Negative for suicidal ideas.   Objective:  BP 108/75 (BP Location: Left Arm, Patient Position: Sitting, Cuff Size: Normal)   Pulse 93   Temp 98.4 F (36.9 C) (Oral)   Resp 18   Ht '5\' 6"'  (1.676 m)   Wt 292 lb 9.6 oz (132.7 kg)   SpO2 98%   BMI 47.23 kg/m   BP/Weight 06/19/2017 05/19/2017 8/50/2774  Systolic BP 128 786 -  Diastolic BP 75 88 -  Wt. (Lbs) 292.6 - 298.06  BMI 47.23 - 48.11   Physical Exam  Constitutional: She is oriented to person, place, and time. She appears well-developed and well-nourished.  HENT:  Head: Normocephalic and atraumatic.  [name] Ear: External ear normal.  Left Ear: External ear normal.  Nose: Nose normal.  Mouth/Throat: Oropharynx is clear and moist.  Eyes: Pupils are equal, round, and reactive to light. Conjunctivae and EOM are normal.  Neck: Normal range of motion. Neck supple.  Cardiovascular: Normal rate, regular rhythm, normal heart sounds and intact distal pulses.   Pulses:      Carotid pulses are 2+ on the [name] side, and 2+ on  the left side.      Radial pulses are 2+ on the [name] side, and 2+ on the left side.       Dorsalis pedis pulses are 2+ on the [name] side, and 2+ on the left side.  Pulmonary/Chest: Effort normal and breath sounds normal.  Abdominal: Soft. Bowel sounds are normal.  Musculoskeletal: Normal range of motion.  Strength 5/5 to  BUE/BLE extremities.  Neurological: She is alert and oriented to person, place, and time. She has normal reflexes. No cranial nerve deficit. She exhibits normal muscle tone.  Skin: Skin is warm and dry.  Psychiatric: She has a normal mood and affect.  Nursing note and vitals reviewed.  Assessment & Plan:   Problem List Items Addressed This Visit      Musculoskeletal and Integument   DJD (degenerative joint disease) of knee    Other Visit Diagnoses    Hospital discharge follow-up    -  Primary   Relevant Orders   CMP14+EGFR (Completed)   Ambulatory referral to Neurology   Ambulatory referral to Physical Therapy   Ambulatory referral to Occupational Therapy   Consult to care management   History of CVA with residual deficit       Relevant Orders   Lipid Panel (Completed)   Ambulatory referral to Neurology   Ambulatory referral to Physical Therapy   Ambulatory referral to Occupational Therapy   Consult to care management   Swelling of [name] extremity       Do not suspect that patient's symptoms are related to gout. However due to her concerns will rule out .   Due to recent CVA and hospital admission will order study to rule out possibility of blood clot.    Relevant Orders   VAS [place] LOWER EXTREMITY VENOUS (DVT)   Uric acid (Completed)   Fatigue, unspecified type       Relevant Orders   TSH (Completed)   CBC with Differential (Completed)   Insomnia, unspecified type       Relevant Medications   traZODone (DESYREL) 50 MG tablet      Meds ordered this encounter  Medications  . traZODone (DESYREL) 50 MG tablet    Sig: Take 0.5-1 tablets (25-50 mg  total) by mouth at bedtime as needed for sleep.    Dispense:  30 tablet    Refill:  0    Order Specific Question:   Supervising Provider    Answer:   Tresa Garter [id number]    Follow-up: Return in about 8 weeks (around 08/14/2017) for HTN/HLD.   Alfonse Spruce FNP

## 2017-06-19 NOTE — Patient Instructions (Signed)
Fat and Cholesterol Restricted Diet Getting too much fat and cholesterol in your diet may cause health problems. Following this diet helps keep your fat and cholesterol at normal levels. This can keep you from getting sick. What types of fat should I choose?  Choose monosaturated and polyunsaturated fats. These are found in foods such as olive oil, canola oil, flaxseeds, walnuts, almonds, and seeds.  Eat more omega-3 fats. Good choices include salmon, mackerel, sardines, tuna, flaxseed oil, and ground flaxseeds.  Limit saturated fats. These are in animal products such as meats, butter, and cream. They can also be in plant products such as palm oil, palm kernel oil, and coconut oil.  Avoid foods with partially hydrogenated oils in them. These contain trans fats. Examples of foods that have trans fats are stick margarine, some tub margarines, cookies, crackers, and other baked goods. What general guidelines do I need to follow?  Check food labels. Look for the words "trans fat" and "saturated fat."  When preparing a meal: ? Fill half of your plate with vegetables and green salads. ? Fill one fourth of your plate with whole grains. Look for the word "whole" as the first word in the ingredient list. ? Fill one fourth of your plate with lean protein foods.  Eat more foods that have fiber, like apples, carrots, beans, peas, and barley.  Eat more home-cooked foods. Eat less at restaurants and buffets.  Limit or avoid alcohol.  Limit foods high in starch and sugar.  Limit fried foods.  Cook foods without frying them. Baking, boiling, grilling, and broiling are all great options.  Lose weight if you are overweight. Losing even a small amount of weight can help your overall health. It can also help prevent diseases such as diabetes and heart disease. What foods can I eat? Grains Whole grains, such as whole wheat or whole grain breads, crackers, cereals, and pasta. Unsweetened oatmeal,  bulgur, barley, quinoa, or brown rice. Corn or whole wheat flour tortillas. Vegetables Fresh or frozen vegetables (raw, steamed, roasted, or grilled). Green salads. Fruits All fresh, canned (in natural juice), or frozen fruits. Meat and Other Protein Products Ground beef (85% or leaner), grass-fed beef, or beef trimmed of fat. Skinless chicken or turkey. Ground chicken or turkey. Pork trimmed of fat. All fish and seafood. Eggs. Dried beans, peas, or lentils. Unsalted nuts or seeds. Unsalted canned or dry beans. Dairy Low-fat dairy products, such as skim or 1% milk, 2% or reduced-fat cheeses, low-fat ricotta or cottage cheese, or plain low-fat yogurt. Fats and Oils Tub margarines without trans fats. Light or reduced-fat mayonnaise and salad dressings. Avocado. Olive, canola, sesame, or safflower oils. Natural peanut or almond butter (choose ones without added sugar and oil). The items listed above may not be a complete list of recommended foods or beverages. Contact your dietitian for more options. What foods are not recommended? Grains White bread. White pasta. White rice. Cornbread. Bagels, pastries, and croissants. Crackers that contain trans fat. Vegetables White potatoes. Corn. Creamed or fried vegetables. Vegetables in a cheese sauce. Fruits Dried fruits. Canned fruit in light or heavy syrup. Fruit juice. Meat and Other Protein Products Fatty cuts of meat. Ribs, chicken wings, bacon, sausage, bologna, salami, chitterlings, fatback, hot dogs, bratwurst, and packaged luncheon meats. Liver and organ meats. Dairy Whole or 2% milk, cream, half-and-half, and cream cheese. Whole milk cheeses. Whole-fat or sweetened yogurt. Full-fat cheeses. Nondairy creamers and whipped toppings. Processed cheese, cheese spreads, or cheese curds. Sweets and Desserts Corn   syrup, sugars, honey, and molasses. Candy. Jam and jelly. Syrup. Sweetened cereals. Cookies, pies, cakes, donuts, muffins, and ice  cream. Fats and Oils Butter, stick margarine, lard, shortening, ghee, or bacon fat. Coconut, palm kernel, or palm oils. Beverages Alcohol. Sweetened drinks (such as sodas, lemonade, and fruit drinks or punches). The items listed above may not be a complete list of foods and beverages to avoid. Contact your dietitian for more information. This information is not intended to replace advice given to you by your health care provider. Make sure you discuss any questions you have with your health care provider. Document Released: 05/20/2012 Document Revised: 07/26/2016 Document Reviewed: 02/18/2014 Elsevier Interactive Patient Education  2018 [employer].   Insomnia Insomnia is a sleep disorder that makes it difficult to fall asleep or to stay asleep. Insomnia can cause tiredness (fatigue), low energy, difficulty concentrating, mood swings, and poor performance at work or school. There are three different ways to classify insomnia:  Difficulty falling asleep.  Difficulty staying asleep.  Waking up too early in the morning.  Any type of insomnia can be long-term (chronic) or short-term (acute). Both are common. Short-term insomnia usually lasts for three months or less. Chronic insomnia occurs at least three times a week for longer than three months. What are the causes? Insomnia may be caused by another condition, situation, or substance, such as:  Anxiety.  Certain medicines.  Gastroesophageal reflux disease (GERD) or other gastrointestinal conditions.  Asthma or other breathing conditions.  Restless legs syndrome, sleep apnea, or other sleep disorders.  Chronic pain.  Menopause. This may include hot flashes.  Stroke.  Abuse of alcohol, tobacco, or illegal drugs.  Depression.  Caffeine.  Neurological disorders, such as Alzheimer disease.  An overactive thyroid (hyperthyroidism).  The cause of insomnia may not be known. What increases the risk? Risk factors for  insomnia include:  Gender. Women are more commonly affected than men.  Age. Insomnia is more common as you get older.  Stress. This may involve your professional or personal life.  Income. Insomnia is more common in people with lower income.  Lack of exercise.  Irregular work schedule or night shifts.  Traveling between different time zones.  What are the signs or symptoms? If you have insomnia, trouble falling asleep or trouble staying asleep is the main symptom. This may lead to other symptoms, such as:  Feeling fatigued.  Feeling nervous about going to sleep.  Not feeling rested in the morning.  Having trouble concentrating.  Feeling irritable, anxious, or depressed.  How is this treated? Treatment for insomnia depends on the cause. If your insomnia is caused by an underlying condition, treatment will focus on addressing the condition. Treatment may also include:  Medicines to help you sleep.  Counseling or therapy.  Lifestyle adjustments.  Follow these instructions at home:  Take medicines only as directed by your health care provider.  Keep regular sleeping and waking hours. Avoid naps.  Keep a sleep diary to help you and your health care provider figure out what could be causing your insomnia. Include: ? When you sleep. ? When you wake up during the night. ? How well you sleep. ? How rested you feel the next day. ? Any side effects of medicines you are taking. ? What you eat and drink.  Make your bedroom a comfortable place where it is easy to fall asleep: ? Put up shades or special blackout curtains to block light from outside. ? Use a white noise machine  to block noise. ? Keep the temperature cool.  Exercise regularly as directed by your health care provider. Avoid exercising right before bedtime.  Use relaxation techniques to manage stress. Ask your health care provider to suggest some techniques that may work well for you. These may  include: ? Breathing exercises. ? Routines to release muscle tension. ? Visualizing peaceful scenes.  Cut back on alcohol, caffeinated beverages, and cigarettes, especially close to bedtime. These can disrupt your sleep.  Do not overeat or eat spicy foods right before bedtime. This can lead to digestive discomfort that can make it hard for you to sleep.  Limit screen use before bedtime. This includes: ? Watching TV. ? Using your smartphone, tablet, and computer.  Stick to a routine. This can help you fall asleep faster. Try to do a quiet activity, brush your teeth, and go to bed at the same time each night.  Get out of bed if you are still awake after 15 minutes of trying to sleep. Keep the lights down, but try reading or doing a quiet activity. When you feel sleepy, go back to bed.  Make sure that you drive carefully. Avoid driving if you feel very sleepy.  Keep all follow-up appointments as directed by your health care provider. This is important. Contact a health care provider if:  You are tired throughout the day or have trouble in your daily routine due to sleepiness.  You continue to have sleep problems or your sleep problems get worse. Get help right away if:  You have serious thoughts about hurting yourself or someone else. This information is not intended to replace advice given to you by your health care provider. Make sure you discuss any questions you have with your health care provider. Document Released: 11/16/2000 Document Revised: 04/20/2016 Document Reviewed: 08/20/2014 Elsevier Interactive Patient Education  [employer].

## 2017-06-20 LAB — CBC WITH DIFFERENTIAL/PLATELET
BASOS ABS: 0 10*3/uL (ref 0.0–0.2)
Basos: 0 %
EOS (ABSOLUTE): 0.1 10*3/uL (ref 0.0–0.4)
EOS: 1 %
HEMATOCRIT: 38.5 % (ref 34.0–46.6)
HEMOGLOBIN: 12.3 g/dL (ref 11.1–15.9)
IMMATURE GRANULOCYTES: 0 %
Immature Grans (Abs): 0 10*3/uL (ref 0.0–0.1)
Lymphocytes Absolute: 3 10*3/uL (ref 0.7–3.1)
Lymphs: 34 %
MCH: 26.7 pg (ref 26.6–33.0)
MCHC: 31.9 g/dL (ref 31.5–35.7)
MCV: 84 fL (ref 79–97)
MONOCYTES: 8 %
Monocytes Absolute: 0.7 10*3/uL (ref 0.1–0.9)
NEUTROS PCT: 57 %
Neutrophils Absolute: 5 10*3/uL (ref 1.4–7.0)
Platelets: 228 10*3/uL (ref 150–379)
RBC: 4.6 x10E6/uL (ref 3.77–5.28)
RDW: 13.5 % (ref 12.3–15.4)
WBC: 8.8 10*3/uL (ref 3.4–10.8)

## 2017-06-20 LAB — CMP14+EGFR
A/G RATIO: 1.4 (ref 1.2–2.2)
ALK PHOS: 105 IU/L (ref 39–117)
ALT: 19 IU/L (ref 0–32)
AST: 18 IU/L (ref 0–40)
Albumin: 4.1 g/dL (ref 3.5–5.5)
BILIRUBIN TOTAL: 0.3 mg/dL (ref 0.0–1.2)
BUN / CREAT RATIO: 21 (ref 9–23)
BUN: 15 mg/dL (ref 6–24)
CHLORIDE: 102 mmol/L (ref 96–106)
CO2: 27 mmol/L (ref 20–29)
Calcium: 11.5 mg/dL — ABNORMAL HIGH (ref 8.7–10.2)
Creatinine, Ser: 0.72 mg/dL (ref 0.57–1.00)
GFR calc Af Amer: 110 mL/min/{1.73_m2} (ref >59)
GFR calc non Af Amer: 95 mL/min/{1.73_m2} (ref >59)
GLOBULIN, TOTAL: 2.9 g/dL (ref 1.5–4.5)
GLUCOSE: 91 mg/dL (ref 65–99)
POTASSIUM: 3.8 mmol/L (ref 3.5–5.2)
SODIUM: 142 mmol/L (ref 134–144)
Total Protein: 7 g/dL (ref 6.0–8.5)

## 2017-06-20 LAB — LIPID PANEL
CHOLESTEROL TOTAL: 141 mg/dL (ref 100–199)
Chol/HDL Ratio: 2.4 ratio (ref 0.0–4.4)
HDL: 58 mg/dL (ref >39)
LDL Calculated: 70 mg/dL (ref 0–99)
Triglycerides: 66 mg/dL (ref 0–149)
VLDL Cholesterol Cal: 13 mg/dL (ref 5–40)

## 2017-06-20 LAB — TSH: TSH: 1.95 u[IU]/mL (ref 0.450–4.500)

## 2017-06-21 ENCOUNTER — Other Ambulatory Visit: Payer: Self-pay | Admitting: Family Medicine

## 2017-06-21 LAB — URIC ACID

## 2017-06-24 ENCOUNTER — Telehealth: Payer: Self-pay

## 2017-06-24 LAB — SPECIMEN STATUS REPORT

## 2017-06-24 LAB — URIC ACID: URIC ACID: 3.9 mg/dL (ref 2.5–7.1)

## 2017-06-24 NOTE — Telephone Encounter (Signed)
Call placed to the patient at the request of Fredia Beets, FNP to inquire about her DME needs. She stated that she would like a shower chair. Explained to her that the prescription for the equipment can be sent to [facility] and they will verify insurance coverage. There is no guarantee that her insurance will pay for the DME.   She said that she has an appointment scheduled with Neurology but has not heard from PT/OT yet. Inquired if she would like to wait to have her evaluation with PT/OT prior to ordering the shower chair but she said no.  She also stated that she has been driving as her providers have not instructed her not to drive. This CM provided her with the phone # for medicaid transportation # [phone] and explained that this service would provide transportation to her medical appointments if she qualifies. Also discussed [facility] transportation but she said that she will hold off on that for now.  She stated that she has a prescription, meloxicam,  at the [facility] outpatient pharmacy that she needs to pick up. She said that going forward, she would like to have all of her prescriptions filled at [place]. Staff message sent to Fredia Beets, FNP with an update of the patient's requests.

## 2017-06-25 ENCOUNTER — Other Ambulatory Visit: Payer: Self-pay | Admitting: Family Medicine

## 2017-06-25 DIAGNOSIS — E785 Hyperlipidemia, unspecified: Secondary | ICD-10-CM

## 2017-06-25 MED ORDER — ATORVASTATIN CALCIUM 40 MG PO TABS: 40.0000 mg | ORAL_TABLET | Freq: Every day | ORAL | 3 refills | Status: DC

## 2017-06-26 ENCOUNTER — Other Ambulatory Visit: Payer: Self-pay | Admitting: Family Medicine

## 2017-06-27 ENCOUNTER — Telehealth: Payer: Self-pay

## 2017-06-27 ENCOUNTER — Other Ambulatory Visit: Payer: Self-pay | Admitting: Family Medicine

## 2017-06-27 NOTE — Telephone Encounter (Signed)
Pt. Returned call requesting results. Please f/u with pt.

## 2017-06-27 NOTE — Telephone Encounter (Signed)
CMA call regarding lab results   Patient verify DOB  Patient was aware and understood  

## 2017-06-27 NOTE — Telephone Encounter (Signed)
-----   Message from Alfonse Spruce, [place] sent at 06/25/2017  2:19 PM EDT ----- -Uric acid test is normal. When uric acid is high it can cause gout.  -Labs that evaluated your blood cell is normal. No signs of anemia, infection, or inflammation present. Anemia can cause fatigue symptoms. -Thyroid function normal. When underfunctioning it can cause fatigue symptoms.  -Lipid levels normal.  .Kidney function normal Calcium level was elevated. Recommend labs to evaluate parathyroid function.

## 2017-06-27 NOTE — Telephone Encounter (Signed)
CMA call regarding lab results   Patient did not answer but left a VM stating the reason of the call & to call back  

## 2017-06-28 ENCOUNTER — Ambulatory Visit (HOSPITAL_COMMUNITY)
Admission: RE | Admit: 2017-06-28 | Discharge: 2017-06-28 | Disposition: A | Discharge status: Home / Self Care | Payer: Medicare Other | Source: Ambulatory Visit | Attending: Family Medicine | Admitting: Family Medicine

## 2017-06-28 ENCOUNTER — Other Ambulatory Visit: Payer: Self-pay | Admitting: Family Medicine

## 2017-06-28 DIAGNOSIS — M7989 Other specified soft tissue disorders: Secondary | ICD-10-CM | POA: Insufficient documentation

## 2017-06-28 NOTE — Progress Notes (Signed)
VASCULAR LAB PRELIMINARY  PRELIMINARY  PRELIMINARY  PRELIMINARY  Right lower extremity venous duplex completed.    Preliminary report:  Right:  No evidence of DVT, superficial thrombosis, or Baker's cyst.  Chrstopher Malenfant, RVS 06/28/2017, 11:55 AM

## 2017-07-02 ENCOUNTER — Other Ambulatory Visit: Payer: Self-pay | Admitting: Family Medicine

## 2017-07-03 ENCOUNTER — Telehealth: Payer: Self-pay

## 2017-07-03 NOTE — Telephone Encounter (Signed)
Prescription for shower bench faxed to [facility].

## 2017-07-04 NOTE — Telephone Encounter (Signed)
-----   Message from Alfonse Spruce, [place] sent at 07/04/2017  1:23 PM EDT ----- Imaging of right lower leg shows no evidence of blood clot or cyst.

## 2017-07-04 NOTE — Telephone Encounter (Signed)
Patient verified DOB Patient is aware of no blood clot or cyst being noted. Patient had no further questions.

## 2017-07-05 ENCOUNTER — Telehealth: Payer: Self-pay

## 2017-07-05 NOTE — Telephone Encounter (Signed)
Call placed to [facility], spoke to [place] who stated that the order has been received and they left a message for the patient inquiring if she wants the shower bench delivered or wants to pick it up.

## 2017-07-08 ENCOUNTER — Other Ambulatory Visit: Payer: Self-pay | Admitting: Family Medicine

## 2017-07-08 ENCOUNTER — Telehealth: Payer: Self-pay

## 2017-07-08 DIAGNOSIS — Z8673 Personal history of transient ischemic attack (TIA), and cerebral infarction without residual deficits: Secondary | ICD-10-CM

## 2017-07-08 DIAGNOSIS — I10 Essential (primary) hypertension: Secondary | ICD-10-CM

## 2017-07-08 DIAGNOSIS — M170 Bilateral primary osteoarthritis of knee: Secondary | ICD-10-CM

## 2017-07-08 MED ORDER — ASPIRIN 325 MG PO TABS: 325.0000 mg | ORAL_TABLET | Freq: Every day | ORAL | 3 refills | Status: DC

## 2017-07-08 MED ORDER — HYDROCHLOROTHIAZIDE 25 MG PO TABS: 25.0000 mg | ORAL_TABLET | Freq: Every day | ORAL | 3 refills | Status: DC

## 2017-07-08 MED ORDER — MELOXICAM 7.5 MG PO TABS: 7.5000 mg | ORAL_TABLET | Freq: Every day | ORAL | 2 refills | Status: DC

## 2017-07-08 MED ORDER — VALSARTAN 160 MG PO TABS: 160.0000 mg | ORAL_TABLET | Freq: Every day | ORAL | 3 refills | Status: DC

## 2017-07-08 MED ORDER — ASPIRIN 325 MG PO TABS: 325.0000 mg | ORAL_TABLET | Freq: Every day | ORAL | 1 refills | Status: DC

## 2017-07-08 MED ORDER — ASPIRIN 325 MG PO TABS: 325.0000 mg | ORAL_TABLET | Freq: Every day | ORAL | 3 refills | Status: AC

## 2017-07-08 NOTE — Telephone Encounter (Signed)
CMA call regarding medication refill   Patient verify DOB  Patient was aware and understood

## 2017-07-08 NOTE — Telephone Encounter (Signed)
Refills for hydrochlorothiazide, valsartan, mobic, and aspirin placed. Please ask patient if she is requesting refills on any other medications. Referral for OT/PT still appear to be in progress. Will follow up with referral specialist. For neurology appointment confirmation she call contact the neurology office to confirm appt.date/time with them.

## 2017-07-08 NOTE — Telephone Encounter (Signed)
Call received from the patient inquiring about the status of the shower bench. Informed her that [facility] has been trying to contact her to arrange for delivery or pick up. Provided her with the number for [facility] - # [phone].  She also wanted to confirm the appointment for neurology  - 07/31/17 @ 1030. She said that she has not heard from OT/PT yet   She also noted that she is out of some of her medications, including her BP meds.  She called the [facility] today and was told that new prescription are needed.

## 2017-07-12 ENCOUNTER — Ambulatory Visit: Payer: Medicare Other | Admitting: Occupational Therapy

## 2017-07-12 ENCOUNTER — Encounter: Payer: Self-pay | Admitting: Occupational Therapy

## 2017-07-12 ENCOUNTER — Ambulatory Visit: Payer: Medicare Other | Attending: Family Medicine

## 2017-07-12 DIAGNOSIS — I69315 Cognitive social or emotional deficit following cerebral infarction: Secondary | ICD-10-CM | POA: Insufficient documentation

## 2017-07-12 DIAGNOSIS — M25612 Stiffness of left shoulder, not elsewhere classified: Secondary | ICD-10-CM | POA: Insufficient documentation

## 2017-07-12 DIAGNOSIS — R42 Dizziness and giddiness: Secondary | ICD-10-CM | POA: Insufficient documentation

## 2017-07-12 DIAGNOSIS — M25511 Pain in right shoulder: Secondary | ICD-10-CM | POA: Insufficient documentation

## 2017-07-12 DIAGNOSIS — M6281 Muscle weakness (generalized): Secondary | ICD-10-CM

## 2017-07-12 DIAGNOSIS — R2689 Other abnormalities of gait and mobility: Secondary | ICD-10-CM | POA: Diagnosis not present

## 2017-07-12 DIAGNOSIS — R2681 Unsteadiness on feet: Secondary | ICD-10-CM

## 2017-07-12 DIAGNOSIS — M25611 Stiffness of right shoulder, not elsewhere classified: Secondary | ICD-10-CM

## 2017-07-12 NOTE — Therapy (Signed)
[facility] [address] [place], [place], [address] Phone: [phone]   Fax:  [phone]  Occupational Therapy Evaluation  Patient Details  Name: Kathleen [name] MRN: [id number] Date of Birth: [birth year] Referring Provider: Jerilynn Mages. Hairston  Encounter Date: 07/12/2017      OT End of Session - 07/12/17 1721    Visit Number 1   Number of Visits 9   Date for OT Re-Evaluation 08/26/17   Authorization Type medicare - Gcode and PN every 10th visit   Authorization - Visit Number 1   Authorization - Number of Visits 10   OT Start Time 2878   OT Stop Time 1625   OT Time Calculation (min) 50 min   Activity Tolerance Patient tolerated treatment well   Behavior During Therapy WFL for tasks assessed/performed      Past Medical History:  Diagnosis Date  . Hypertension    NO MED MD TO GIVE RX  . Obesity     Past Surgical History:  Procedure Laterality Date  . CARPAL TUNNEL RELEASE  01/12/2012   Procedure: CARPAL TUNNEL RELEASE;  Surgeon: Schuyler Amor, MD;  Location: [facility];  Service: Orthopedics;  Laterality: Right;  . CESAREAN SECTION    . NOSE SURGERY     BROKEN   MANY YRS AGO   . TUBAL LIGATION    . WRIST FRACTURE SURGERY  Feb 2013    There were no vitals filed for this visit.      Subjective Assessment - 07/12/17 1541    Subjective  I had a situation the other day when I could not remember anything.     Currently in Pain? No/denies   Multiple Pain Sites No           [facility] OT Assessment - 07/12/17 1542      Assessment   Diagnosis CVA   Referring Provider M. Hairston   Onset Date 05/17/17   Prior Therapy In the hospital - acute OT/PT      Precautions   Precautions Fall     Restrictions   Weight Bearing Restrictions No     Balance Screen   Has the patient fallen in the past 6 months Yes   How many times? 3   Has the patient had a decrease in activity level because of a fear of falling?  Yes    Is the patient reluctant to leave their home because of a fear of falling?  No     Home  Environment   Family/patient expects to be discharged to: --   Living Arrangements Spouse/significant other   Bathroom Shower/Tub Tub/Shower unit   Shower/tub characteristics Insurance underwriter     Prior Function   Level of Independence Independent;Independent with basic ADLs   Vocation Part time employment   Vocation Requirements [employer]: part time but MD said no for right now.    Leisure Pt likes to do everything she can to have fun (but can't walk far). Go to the gym, hang out with friends.      ADL   Eating/Feeding Independent   Grooming Minimal assistance   Upper Body Bathing Supervision/safety   Lower Body Bathing Supervision/safety   Upper Body Dressing Minimal assistance   Lower Body Dressing Increased time;Supervision/safety   Toilet Transfer Independent   Toileting - Clothing Manipulation Independent   Tub/Shower Transfer Minimal assistance   ADL comments Has a tub seat, would  benefit from a tub bench - has had 2 falls in shower     IADL   Prior Level of Function Shopping Independent   Shopping Needs to be accompanied on any shopping trip   Prior Level of Function Light Housekeeping Independent   Light Housekeeping Needs help with all home maintenance tasks   Prior Level of Function Meal Prep independent   Meal Prep Able to complete simple warm meal prep   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     Written Expression   Dominant Hand Right   Handwriting 100% legible     Vision - History   Baseline Vision Wears glasses only for reading     Vision Assessment   Eye Alignment Within Functional Limits     Activity Tolerance   Activity Tolerance Comments Endurance limits participation in even basic ADL     Cognition   Overall Cognitive Status Impaired/Different from  baseline   Attention Selective   Selective Attention Impaired   Awareness Impaired   Problem Solving Impaired   Executive Function Organizing;Self Monitoring;Reasoning     Observation/Other Assessments   Focus on Therapeutic Outcomes (FOTO)  Neuro QOL UE 35.5   Outcome Measures MOCA- 25/30 attention     Sensation   Light Touch Impaired by gross assessment     Coordination   Right 9 Hole Peg Test 24.81   Left 9 Hole Peg Test 28.22     Perception   Perception Not tested     Praxis   Praxis Not tested     ROM / Strength   AROM / PROM / Strength AROM     AROM   Overall AROM  Deficits   Overall AROM Comments Painful right shoulder along deltoid insertion   AROM Assessment Site Shoulder   Right/Left Shoulder Right;Left   Right Shoulder Flexion 120 Degrees   Left Shoulder Flexion 140 Degrees     Strength   Overall Strength Unable to assess;Due to pain     Hand Function   Right Hand Gross Grasp Impaired   Right Hand Grip (lbs) 50   Right Hand Lateral Pinch 10 lbs   Left Hand Gross Grasp Impaired   Left Hand Grip (lbs) 58   Left Hand Lateral Pinch 13 lbs                         OT Education - 07/12/17 1721    Education provided Yes   Education Details evaluation findings and potential goals for OT   Person(s) Educated Patient   Methods Explanation   Comprehension Verbalized understanding             OT Long Term Goals - 07/12/17 1746      OT LONG TERM GOAL #1   Title Patient will complete a home exercise program designed to improve range of motion in right shoulder.   Time 4   Period Weeks   Status New   Target Date 08/26/17     OT LONG TERM GOAL #2   Title Patient will complete simple housekeeping task  for 15+ minutes with modified independence   Time 4   Period Weeks   Status New     OT LONG TERM GOAL #3   Title Patient will report pain no greater than 3/10 when reaching into shoulder height cabinet to retrieve/ place a 3 lb  object with right hand   Time  4   Period Weeks   Status New     OT LONG TERM GOAL #4   Title Patient will improve right hand strength to aide with ADL/IADL - GRIP BY 5 LB, PINCH BY 2 LB   Baseline grip 50, pinch 10   Time 4   Period Weeks   Status New     OT LONG TERM GOAL #5   Title Patient will prepare a full meal for her family, including organizing menu, writing grocery list, shopping for grocery items, and following recipe for 3 course meal     Time 4   Period Weeks   Status New     Long Term Additional Goals   Additional Long Term Goals Yes     OT LONG TERM GOAL #6   Title Patient will complete ADL including shower with modified independence   Time 4   Period Weeks   Status New               Plan - 07/12/17 1723    Clinical Impression Statement Patient is a 56 year old woman who recently was hospitalized for a subacute cerebellum stroke in June 2018.  Patient has experienced decreased balance, activity tolerance, coordination, cognition ( attention, memory), and decreased strength in right upper extremity which impede her ability to perform even basic self care tasks.  Patient lives with husband who, per her report, has multiple medical issues,a nd is unable to assist her.  Patient is relying on other family members to assist with bathing, dressing, housekeeping, shopping, etc.  Patient was working part time and independent with ADL/IADL prior to this stroke.     Occupational Profile and client history currently impacting functional performance Patient's past medical history is significant for hypertension, hyperlipidemia, obesity, tobacco use.  Patient awaiting sleep study and follow up with neurology.     Occupational performance deficits (Please refer to evaluation for details): ADL's;IADL's;Rest and Sleep;Work;Leisure;Social Participation   Rehab Potential Good   OT Frequency 2x / week   OT Duration 4 weeks   OT Treatment/Interventions Self-care/ADL training;Moist  Heat;Therapeutic exercise;Neuromuscular education;Energy conservation;DME and/or AE instruction;Manual Therapy;Therapist, nutritional;Therapeutic activities;Cognitive remediation/compensation;Patient/family education;Balance training   Plan Simple meal prep in kitchen to assess self organizing behaviors, passive motion right shoulder - reduce pain   Clinical Decision Making Limited treatment options, no task modification necessary   Recommended Other Services ST   Consulted and Agree with Plan of Care Patient      Patient will benefit from skilled therapeutic intervention in order to improve the following deficits and impairments:  Cardiopulmonary status limiting activity, Decreased activity tolerance, Decreased balance, Decreased cognition, Decreased coordination, Decreased safety awareness, Decreased range of motion, Decreased mobility, Decreased knowledge of use of DME, Decreased knowledge of precautions, Decreased endurance, Decreased strength, Impaired UE functional use, Impaired sensation, Impaired flexibility, Impaired perceived functional ability, Increased muscle spasms, Obesity, Pain  Visit Diagnosis: Acute pain of right shoulder - Plan: Ot plan of care cert/re-cert  Cognitive social or emotional deficit following cerebral infarction - Plan: Ot plan of care cert/re-cert  Muscle weakness (generalized) - Plan: Ot plan of care cert/re-cert  Stiffness of right shoulder, not elsewhere classified - Plan: Ot plan of care cert/re-cert  Stiffness of left shoulder, not elsewhere classified - Plan: Ot plan of care cert/re-cert  Unsteadiness on feet - Plan: Ot plan of care cert/re-cert      G-Codes - 94/49/67 1755    Functional Assessment Tool Used (Outpatient only) skilled clinical observation  Functional Limitation Self care   Self Care Current Status [phone]) At least 20 percent but less than 40 percent impaired, limited or restricted   Self Care Goal Status (O0600) At least 1  percent but less than 20 percent impaired, limited or restricted      Problem List Patient Active Problem List   Diagnosis Date Noted  . Obesity, Class III, BMI 40-49.9 (morbid obesity) ([facility])   . Hyperlipidemia   . Tobacco abuse   . Cerebral embolism with cerebral infarction 05/17/2017  . CVA (cerebral vascular accident) ([facility]) 05/17/2017  . Nausea & vomiting 05/17/2017  . Hypertension 05/16/2017  . DJD (degenerative joint disease) of knee 07/01/2014  . Bilateral knee pain 06/30/2014    Mariah Milling, OTR/L 07/12/2017, 5:57 PM  [facility] [address] [place], [place], [address] Phone: [phone]   Fax:  [phone]  Name: Kathleen [name] MRN: [id number] Date of Birth: 11/10/1961

## 2017-07-12 NOTE — Therapy (Signed)
[facility] [address] [place], [place], [address] Phone: [phone]   Fax:  [phone]  Physical Therapy Evaluation  Patient Details  Name: Kathleen [name] MRN: [id number] Date of Birth: 1961-04-25 Referring Provider: Dr. Braulio Conte  Encounter Date: 07/12/2017      PT End of Session - 07/12/17 1743    Visit Number 1   Number of Visits 9   Date for PT Re-Evaluation 08/11/17   Authorization Type Medicare primary and Medicaid secondary per pt (emailed [facility] [employer] [facility] to verify). G-CODE AND PN EVERY 10TH VISIT.    PT Start Time 1447   PT Stop Time 1530   PT Time Calculation (min) 43 min   Activity Tolerance Patient tolerated treatment well   Behavior During Therapy WFL for tasks assessed/performed      Past Medical History:  Diagnosis Date  . Hypertension    NO MED MD TO GIVE RX  . Obesity     Past Surgical History:  Procedure Laterality Date  . CARPAL TUNNEL RELEASE  01/12/2012   Procedure: CARPAL TUNNEL RELEASE;  Surgeon: Schuyler Amor, MD;  Location: [facility];  Service: Orthopedics;  Laterality: Right;  . CESAREAN SECTION    . NOSE SURGERY     BROKEN   MANY YRS AGO   . TUBAL LIGATION    . WRIST FRACTURE SURGERY  Feb 2013    There were no vitals filed for this visit.       Subjective Assessment - 07/12/17 1502    Subjective Pt reports R UE pain, impaired balance during gait, she gets tired easily since 05/17/17 CVA. Pt states she understands she needs to lose weight (she initiated this topic) and would like to [facility] again for [employer]. Pt also has increased weakness. Pt has to nap during the day due to fatigue. Pt stopped taking Meclizine, and reports she has heaviness in her head and a HA when she wakes. Pt states she's not really dizzy and would like to f/u with Dr. Leta Baptist sooner than 07/31/17 appt. PT encouraged pt to call neurologist office to discuss this with MD.   Pertinent History HTN,  DJD of B knees, HLD, obesity   Patient Stated Goals Improve endurance, balance, and walking.    Currently in Pain? No/denies            [facility] PT Assessment - 07/12/17 1510      Assessment   Medical Diagnosis CVA  cerebellar stroke ([place])   Referring Provider Dr. Braulio Conte   Onset Date/Surgical Date 05/17/17  hospital admission 05/17/17-05/19/17 for CVA   Prior Therapy Acute care PT      Precautions   Precautions Fall   Precaution Comments based on falls hx.     Restrictions   Weight Bearing Restrictions No     Balance Screen   Has the patient fallen in the past 6 months Yes   How many times? 2   Has the patient had a decrease in activity level because of a fear of falling?  Yes   Is the patient reluctant to leave their home because of a fear of falling?  Yes     [place] residence   Living Arrangements Spouse/significant other   Available Help at Discharge Family   Type of [place] to enter   Entrance Stairs-Number of Steps 3   Entrance Stairs-Rails Can reach both   Home  Layout One level   [place] - single point  walking stick, shower chair doesn't fit   Additional Comments Pt would like her bathroom to be set up in a safer manner.      Prior Function   Level of Independence Independent   Vocation Part time employment   Vocation Requirements [employer]: part time but MD said no for right now.    Leisure Pt likes to do everything she can to have fun (but can't walk far). Go to the gym, hang out with friends.      Cognition   Overall Cognitive Status Impaired/Different from baseline   Memory Impaired  per pt and word finding issues     Observation/Other Assessments   Focus on Therapeutic Outcomes (FOTO)  Neuro QOL LE: 34.8     Sensation   Light Touch Impaired by gross assessment   Additional Comments Pt reported decr. light touch sensation on L side (UE/LE). Pt reports poor  circulation LEs, pt reports intermittent N/T in L foot.     Coordination   Gross Motor Movements are Fluid and Coordinated Yes   Fine Motor Movements are Fluid and Coordinated Yes     ROM / Strength   AROM / PROM / Strength AROM;Strength     AROM   Overall AROM  Deficits   Overall AROM Comments BLE AROM WFL except for decr. B ankle DF-can obtain neutral.     Strength   Overall Strength Deficits   Overall Strength Comments BLE WFL (5/5) except for B hamstring: 3+/5, ankle DF: 2/5. B hip ext not tested but weakness suspected 2/2 gait deviations.      Transfers   Transfers Sit to Stand;Stand to Sit   Sit to Stand 5: Supervision;With upper extremity assist;From chair/3-in-1   Stand to Sit 5: Supervision;With upper extremity assist;To chair/3-in-1     Ambulation/Gait   Ambulation/Gait Yes   Ambulation/Gait Assistance 5: Supervision   Ambulation/Gait Assistance Details Pt amb. with wide BOS, incr. postural sway (lateral).   Ambulation Distance (Feet) 75 Feet   Assistive device None   Gait Pattern Step-through pattern;Decreased stride length;Wide base of support;Lateral trunk lean to left;Lateral trunk lean to right;Decreased dorsiflexion - right;Decreased dorsiflexion - left   Ambulation Surface Level;Indoor   Gait velocity 3.89ft/sec. no AD     Standardized Balance Assessment   Standardized Balance Assessment Timed Up and Go Test     Timed Up and Go Test   TUG Normal TUG   Normal TUG (seconds) 12.89  no AD            Objective measurements completed on examination: See above findings.                  PT Education - 07/12/17 1743    Education provided Yes   Education Details PT educated pt on exam findings, PT POC, frequency and duration. PT educated pt on contacting [employer] re: [employer]ne[employer]-will provide handout next session.   Person(s) Educated Patient   Methods Explanation   Comprehension Verbalized understanding          PT Short Term Goals -  07/12/17 1755      PT SHORT TERM GOAL #1   Title same as LTGs.           PT Long Term Goals - 07/12/17 1755      PT LONG TERM GOAL #1   Title Pt will be IND in HEP to improve strength, balance, and flexilibity.  TARGET DATE FOR ALL LTGS: 08/09/17   Status New     PT LONG TERM GOAL #2   Title Perform DGI and write goal as indicated.    Status New     PT LONG TERM GOAL #3   Title Pt will report zero falls in the last 4 weeks to improve safety during functional mobility.    Status New     PT LONG TERM GOAL #4   Title Perform 6 MWT and write goal as indicated.   Status New     PT LONG TERM GOAL #5   Title Pt will amb. 600' over even/uneven terrain, IND, without LOB in order to improve functional mobility and visit friends.   Status New                Plan - 07/12/17 1747    Clinical Impression Statement Pt is a pleasant 56y/o female presenting to OPPT neuro s/p cerebellar CVA. Pt hospitalized 05/17/17-05/19/17. Pt's PMH significant for the following: HTN, DJD of B knees, HLD, obesity. The following deficits were noted during exam: gait deviations, impaired balance, decr. endurance, impaired flexibility, UE pain-PT will not directly address but will monitor as OT is treating pt's UE, and impaired strength. Pt reported dizziness is not an issue, and describes a "heavy" feeling in head that she would like neurologist to address, PT will continue to monitor. Pt's gait speed indicate pt is able to amb. safely in the community, however, pt has experienced 2 falls in the last 6 months. Pt's TUG time also indicated pt is at decr. risk for falls. Therefore, PT will perform DGI next session to incorporate dynamic activities during gait, as pt reports dynamic activities incr. postural sway. DGI and 6 MWT not performed today 2/2 time constraints, as pt had difficulty recalling hx and word finding issues.  Pt would benefit from skilled PT to improve safety during functional mobility.    History  and Personal Factors relevant to plan of care: Pt unable to work at this time, pt reports her husband does not understand why she's experiencing balance issues at this time.    Clinical Presentation Stable   Clinical Presentation due to: HTN, DJD of B knees, HLD, obesity   Clinical Decision Making Moderate   Rehab Potential Good   PT Frequency 2x / week   PT Duration 4 weeks   PT Treatment/Interventions ADLs/Self Care Home Management;Biofeedback;Canalith Repostioning;Electrical Stimulation;Neuromuscular re-education;Balance training;Cognitive remediation;Therapeutic exercise;Therapeutic activities;Manual techniques;Functional mobility training;Stair training;Gait training;DME Instruction;Orthotic Fit/Training;Patient/family education;Vestibular   PT Next Visit Plan Perform DGI and 6MWT and write goal as indicated. Initiate balance, strengthening (hamstrings and hip), flexibility HEP. Print pt instructions from eval visit (with [employer] info) and give to pt.    Consulted and Agree with Plan of Care Patient      Patient will benefit from skilled therapeutic intervention in order to improve the following deficits and impairments:  Abnormal gait, Decreased endurance, Decreased knowledge of use of DME, Decreased strength, Impaired UE functional use, Obesity, Decreased balance, Decreased mobility, Dizziness, Impaired flexibility  Visit Diagnosis: Other abnormalities of gait and mobility - Plan: PT plan of care cert/re-cert  Muscle weakness (generalized) - Plan: PT plan of care cert/re-cert  Dizziness and giddiness - Plan: PT plan of care cert/re-cert      G-Codes - 82/99/37 1757    Functional Assessment Tool Used (Outpatient Only) Neuro QOL LE: 34.8; gait speed: 3.62ft/sec no AD; TUG time: 12.89 sec. no AD   Functional Limitation Mobility: Walking  and moving around   Mobility: Walking and Moving Around Current Status [phone]) At least 20 percent but less than 40 percent impaired, limited or  restricted   Mobility: Walking and Moving Around Goal Status [phone]) At least 1 percent but less than 20 percent impaired, limited or restricted       Problem List Patient Active Problem List   Diagnosis Date Noted  . Obesity, Class III, BMI 40-49.9 (morbid obesity) ([place])   . Hyperlipidemia   . Tobacco abuse   . Cerebral embolism with cerebral infarction 05/17/2017  . CVA (cerebral vascular accident) ([place]) 05/17/2017  . Nausea & vomiting 05/17/2017  . Hypertension 05/16/2017  . DJD (degenerative joint disease) of knee 07/01/2014  . Bilateral knee pain 06/30/2014    Brookelle Pellicane L 07/12/2017, 5:59 PM  [facility] [address] [place], [place], [address] Phone: [phone]   Fax:  [phone]  Name: Kathleen [name] MRN: [id number] Date of Birth: 05-30-1961  Geoffry Paradise, PT,DPT 07/12/17 6:01 PM Phone: [phone] Fax: [phone]

## 2017-07-12 NOTE — Patient Instructions (Signed)
[name] offers a variety of Wellness options for adults from Group Exercise, Personal Trianing, cancer survivor programs and more! Text Message updates: Text "[email or web address]" to [phone] to receive updates from [place] on the latest happenings at the [facility]. We do limit these messages to our most important updates and will not spam you. For more information contact Casimiro Needle, [name] Director at [phone].

## 2017-07-14 ENCOUNTER — Other Ambulatory Visit: Payer: Self-pay | Admitting: Family Medicine

## 2017-07-14 DIAGNOSIS — I6930 Unspecified sequelae of cerebral infarction: Secondary | ICD-10-CM

## 2017-07-15 ENCOUNTER — Ambulatory Visit: Payer: Medicare Other | Admitting: Rehabilitation

## 2017-07-15 ENCOUNTER — Encounter: Payer: Medicare Other | Admitting: Occupational Therapy

## 2017-07-16 ENCOUNTER — Telehealth: Payer: Self-pay | Admitting: Family Medicine

## 2017-07-16 NOTE — Telephone Encounter (Signed)
Pt called to request an order for a tub bench since the shower chair is too big for her tub and according to [facility] they have a bench that she can used for her tub, please advice?

## 2017-07-16 NOTE — Telephone Encounter (Signed)
Pt called to request an order for a tub bench since the shower chair is too big for her tub and according to [facility] they have a bench that she can used for her tub, please follow up

## 2017-07-16 NOTE — Telephone Encounter (Signed)
Pt is already schedule with OT/PT

## 2017-07-18 ENCOUNTER — Encounter: Payer: Self-pay | Admitting: Occupational Therapy

## 2017-07-18 ENCOUNTER — Ambulatory Visit: Payer: Medicare Other | Admitting: Occupational Therapy

## 2017-07-18 DIAGNOSIS — R2681 Unsteadiness on feet: Secondary | ICD-10-CM

## 2017-07-18 DIAGNOSIS — M25612 Stiffness of left shoulder, not elsewhere classified: Secondary | ICD-10-CM

## 2017-07-18 DIAGNOSIS — M25511 Pain in right shoulder: Secondary | ICD-10-CM | POA: Diagnosis not present

## 2017-07-18 DIAGNOSIS — M6281 Muscle weakness (generalized): Secondary | ICD-10-CM

## 2017-07-18 DIAGNOSIS — R42 Dizziness and giddiness: Secondary | ICD-10-CM | POA: Diagnosis not present

## 2017-07-18 DIAGNOSIS — M25611 Stiffness of right shoulder, not elsewhere classified: Secondary | ICD-10-CM

## 2017-07-18 DIAGNOSIS — I69315 Cognitive social or emotional deficit following cerebral infarction: Secondary | ICD-10-CM

## 2017-07-18 DIAGNOSIS — R2689 Other abnormalities of gait and mobility: Secondary | ICD-10-CM | POA: Diagnosis not present

## 2017-07-18 NOTE — Therapy (Signed)
[facility] [address] [place], [place], [address] Phone: [phone]   Fax:  [phone]  Occupational Therapy Treatment  Patient Details  Name: Kathleen [name] MRN: [id number] Date of Birth: 07-19-1961 Referring Provider: Jerilynn Mages. Hairston  Encounter Date: 07/18/2017      OT End of Session - 07/18/17 1227    Visit Number 2   Number of Visits 9   Date for OT Re-Evaluation 08/26/17   Authorization Type medicare - Gcode and PN every 10th visit   Authorization - Visit Number 2   Authorization - Number of Visits 10   OT Start Time 1103   OT Stop Time 1146   OT Time Calculation (min) 43 min   Activity Tolerance Patient tolerated treatment well      Past Medical History:  Diagnosis Date  . Hypertension    NO MED MD TO GIVE RX  . Obesity     Past Surgical History:  Procedure Laterality Date  . CARPAL TUNNEL RELEASE  01/12/2012   Procedure: CARPAL TUNNEL RELEASE;  Surgeon: Schuyler Amor, MD;  Location: [facility];  Service: Orthopedics;  Laterality: Right;  . CESAREAN SECTION    . NOSE SURGERY     BROKEN   MANY YRS AGO   . TUBAL LIGATION    . WRIST FRACTURE SURGERY  Feb 2013    There were no vitals filed for this visit.      Subjective Assessment - 07/18/17 1103    Subjective  I'm sorry I missed my last appt I don't sleep well at night.    Pertinent History see epic;  L CVA 6/18   Patient Stated Goals to get my arm working better;  worked at [employer] part time and would like to go back.   Currently in Pain? Yes   Pain Score 8    Pain Location Knee   Pain Orientation Right;Left   Pain Descriptors / Indicators Sharp;Shooting   Pain Type Chronic pain   Pain Onset More than a month ago   Pain Frequency Intermittent  it there the majority of the time   Aggravating Factors  I was supposed to have knee replacements but I am afraid   Pain Relieving Factors rest, getting off them, putting my sneakers on   Multiple Pain Sites No                      OT Treatments/Exercises (OP) - 07/18/17 0001      ADLs   ADL Comments Reviewed goals and POC and pt in agreement.  Pt stated "these are good goals."  Pt reports that she already has contacted [facility] to obtain transfer tub bench (recommendation was made at evaluation sessin). Reviewed and provided in writing risk factors and warning signs/symptoms for stroke as pt reported that she was given a book but misplaced it before she could read it. Pt did verbalize that she is trying to eat healthier and also discussed that she is trying to reduce her alcohol intake.  Answered all questions and pt verbalized understanding.      Exercises   Exercises Hand     Hand Exercises   Theraputty Grip;Pinch;Roll   Theraputty - Roll Pt issued HEP with green putty to address grip and pinch strength. Pt required redirection for attention to task as pt self distracts.  Pt able to return demonstrate all activities.  OT Education - 07/18/17 1214    Education provided Yes   Education Details HEP for grip strength/pinch and risk factors and signs/symptoms of stroke.    Person(s) Educated Patient   Methods Explanation;Demonstration;Verbal cues;Handout   Comprehension Verbalized understanding;Returned demonstration             OT Long Term Goals - 07/18/17 1223      OT LONG TERM GOAL #1   Title Patient will complete a home exercise program designed to improve range of motion in right shoulder.   Time 4   Period Weeks   Status On-going     OT LONG TERM GOAL #2   Title Patient will complete simple housekeeping task  for 15+ minutes with modified independence   Time 4   Period Weeks   Status On-going     OT LONG TERM GOAL #3   Title Patient will report pain no greater than 3/10 when reaching into shoulder height cabinet to retrieve/ place a 3 lb object with right hand   Time 4   Period Weeks   Status On-going     OT  LONG TERM GOAL #4   Title Patient will improve right hand strength to aide with ADL/IADL - GRIP BY 5 LB, [place] BY 2 LB   Baseline grip 50, pinch 10   Time 4   Period Weeks   Status On-going     OT LONG TERM GOAL #5   Title Patient will prepare a full meal for her family, including organizing menu, writing grocery list, shopping for grocery items, and following recipe for 3 course meal     Time 4   Period Weeks   Status On-going     OT LONG TERM GOAL #6   Title Patient will complete ADL including shower with modified independence   Time 4   Period Weeks   Status On-going               Plan - 07/18/17 1223    Clinical Impression Statement Pt in agreeent with goals and POC.  Pt progressing toward goals.    Rehab Potential Good   OT Frequency 2x / week   OT Duration 4 weeks   OT Treatment/Interventions Self-care/ADL training;Moist Heat;Therapeutic exercise;Neuromuscular education;Energy conservation;DME and/or AE instruction;Manual Therapy;Therapist, nutritional;Therapeutic activities;Cognitive remediation/compensation;Patient/family education;Balance training   Plan SImple meal prep in kitchen to assess self organization behaviors, passive motion of R shoulder - address pain.    Consulted and Agree with Plan of Care Patient      Patient will benefit from skilled therapeutic intervention in order to improve the following deficits and impairments:  Cardiopulmonary status limiting activity, Decreased activity tolerance, Decreased balance, Decreased cognition, Decreased coordination, Decreased safety awareness, Decreased range of motion, Decreased mobility, Decreased knowledge of use of DME, Decreased knowledge of precautions, Decreased endurance, Decreased strength, Impaired UE functional use, Impaired sensation, Impaired flexibility, Impaired perceived functional ability, Increased muscle spasms, Obesity, Pain  Visit Diagnosis: Acute pain of right shoulder  Cognitive  social or emotional deficit following cerebral infarction  Muscle weakness (generalized)  Stiffness of right shoulder, not elsewhere classified  Stiffness of left shoulder, not elsewhere classified  Unsteadiness on feet    Problem List Patient Active Problem List   Diagnosis Date Noted  . Obesity, Class III, BMI 40-49.9 (morbid obesity) ([place])   . Hyperlipidemia   . Tobacco abuse   . Cerebral embolism with cerebral infarction 05/17/2017  . CVA (cerebral vascular accident) ([place]) 05/17/2017  .  Nausea & vomiting 05/17/2017  . Hypertension 05/16/2017  . DJD (degenerative joint disease) of knee 07/01/2014  . Bilateral knee pain 06/30/2014    [facility] 07/18/2017, 12:29 PM  [facility] [address] [place], [place], [address] Phone: [phone]   Fax:  [phone]  Name: CHARLOTTE FIDALGO MRN: [id number] Date of Birth: 1961/04/11

## 2017-07-18 NOTE — Patient Instructions (Signed)
1. Grip Strengthening (Resistive Putty)  DO EVERY DAY - Green putty.  Long hard squeezes and make it fat in between so there is enough resistance when you squeeze.     Squeeze putty using thumb and all fingers. Repeat _20___ times. Do __2__ sessions per day.   2. Roll putty into [name] hot dog on table and pinch using your thumb, index and middle finger along the tube.  Repeat 5 times.  Do this 2 sessions per day.     Copyright  VHI. All rights reserved.

## 2017-07-18 NOTE — Telephone Encounter (Signed)
Paperwork  received from  [facility] regarding request. Paperwork faxed to [facility] regarding request.

## 2017-07-19 ENCOUNTER — Ambulatory Visit: Payer: Medicare Other | Admitting: Rehabilitation

## 2017-07-22 ENCOUNTER — Ambulatory Visit: Payer: Medicare Other | Admitting: Occupational Therapy

## 2017-07-23 ENCOUNTER — Encounter: Payer: Self-pay | Admitting: Occupational Therapy

## 2017-07-23 ENCOUNTER — Ambulatory Visit: Payer: Medicare Other | Admitting: Occupational Therapy

## 2017-07-23 DIAGNOSIS — I69315 Cognitive social or emotional deficit following cerebral infarction: Secondary | ICD-10-CM

## 2017-07-23 DIAGNOSIS — M25511 Pain in right shoulder: Secondary | ICD-10-CM | POA: Diagnosis not present

## 2017-07-23 DIAGNOSIS — R2681 Unsteadiness on feet: Secondary | ICD-10-CM

## 2017-07-23 DIAGNOSIS — M6281 Muscle weakness (generalized): Secondary | ICD-10-CM

## 2017-07-23 DIAGNOSIS — M25612 Stiffness of left shoulder, not elsewhere classified: Secondary | ICD-10-CM | POA: Diagnosis not present

## 2017-07-23 DIAGNOSIS — M25611 Stiffness of right shoulder, not elsewhere classified: Secondary | ICD-10-CM

## 2017-07-23 DIAGNOSIS — R42 Dizziness and giddiness: Secondary | ICD-10-CM | POA: Diagnosis not present

## 2017-07-23 DIAGNOSIS — R2689 Other abnormalities of gait and mobility: Secondary | ICD-10-CM | POA: Diagnosis not present

## 2017-07-23 NOTE — Patient Instructions (Signed)
Home Program for your Right Arm:  Do these every day 2 times per day (morning and night)  For both exercises lay on your back and hold a light beach ball between your hands.  1. Start with the beach ball on your chest.  Slowly raise ball up toward the ceiling until your elbows are straight. Then lower ball to chest. Do 15.  2. Start with the beach ball on your chest. Slowly raise the ball toward the ceiling until your elbows are straight.  KEEPING ELBOWS STRAIGHT, raise the ball over your head as far as you can WITHOUT PAIN.  Go slow. Return ball to holding it over your chest.  Do 15.     By the end of the day, you will do 30 of each.  We need to be able to have you move in pain free ranges and calm down the pain cycle.

## 2017-07-23 NOTE — Therapy (Signed)
[facility] [address] [place], [place], [address] Phone: [phone]   Fax:  [phone]  Occupational Therapy Treatment  Patient Details  Name: Kathleen [name] MRN: [id number] Date of Birth: [birth year] Referring Provider: Jerilynn Mages. Hairston  Encounter Date: 07/23/2017      OT End of Session - 07/23/17 1503    Visit Number 3   Number of Visits 9   Date for OT Re-Evaluation 08/26/17   Authorization Type medicare - Gcode and PN every 10th visit   Authorization - Visit Number 3   Authorization - Number of Visits 10   OT Start Time 7341   OT Stop Time 1400   OT Time Calculation (min) 43 min   Activity Tolerance Patient tolerated treatment well      Past Medical History:  Diagnosis Date  . Hypertension    NO MED MD TO GIVE RX  . Obesity     Past Surgical History:  Procedure Laterality Date  . CARPAL TUNNEL RELEASE  01/12/2012   Procedure: CARPAL TUNNEL RELEASE;  Surgeon: Schuyler Amor, MD;  Location: [facility];  Service: Orthopedics;  Laterality: Right;  . CESAREAN SECTION    . NOSE SURGERY     BROKEN   MANY YRS AGO   . TUBAL LIGATION    . WRIST FRACTURE SURGERY  Feb 2013    There were no vitals filed for this visit.      Subjective Assessment - 07/23/17 1326    Subjective  I have some heaviness in my head and get headaches sometimes when I first when I wake up   Pertinent History see epic;  L CVA 6/18   Patient Stated Goals to get my arm working better;  worked at [employer] part time and would like to go back.   Currently in Pain? Yes   Pain Score 7    Pain Location Shoulder   Pain Orientation Right   Pain Descriptors / Indicators Aching   Pain Type Chronic pain   Pain Radiating Towards shoulder down into elbow   Pain Onset More than a month ago  2-3 months before the stroke   Pain Frequency Intermittent   Aggravating Factors  raising it up, sleeping on it   Pain Relieving Factors avoiding  movements, repositioning, heat,                       OT Treatments/Exercises (OP) - 07/23/17 0001      Neurological Re-education Exercises   Other Exercises 1 Neuor re ed in supine in closed chain activities to begin to initiate pain free movement (chest presses and bilateral overhead reach with ball).  Pt tolerated well.  Pt issued HEP and strongly reinforced need to do exerises in supine as prescribed as well as reinforced need for compliance with HEP if pt wished to reach goals.  Pt verbalized understanding.  Discussed need to decrease pain and address AROM of R shoulder within pain free range in order to progress.     Manual Therapy   Manual Therapy Joint mobilization;Soft tissue mobilization;Scapular mobilization   Manual therapy comments Joint, soft tissue and scap mob in supine prior to neuro re ed to decrease tightness, improve alignment, decrease pain and in prep for active movement.  Pt initially with poor tolerance however tolerance improved through out session.                 OT Education - 07/23/17 1501  Education provided Yes   Education Details HEP for RUE with ball in supine   Methods Explanation;Demonstration;Verbal cues;Handout   Comprehension Verbalized understanding;Returned demonstration             OT Long Term Goals - 07/23/17 1501      OT LONG TERM GOAL #1   Title Patient will complete a home exercise program designed to improve range of motion in right shoulder.   Time 4   Period Weeks   Status On-going     OT LONG TERM GOAL #2   Title Patient will complete simple housekeeping task  for 15+ minutes with modified independence   Time 4   Period Weeks   Status On-going     OT LONG TERM GOAL #3   Title Patient will report pain no greater than 3/10 when reaching into shoulder height cabinet to retrieve/ place a 3 lb object with right hand   Time 4   Period Weeks   Status On-going     OT LONG TERM GOAL #4   Title Patient  will improve right hand strength to aide with ADL/IADL - GRIP BY 5 LB, [place] BY 2 LB   Baseline grip 50, pinch 10   Time 4   Period Weeks   Status On-going     OT LONG TERM GOAL #5   Title Patient will prepare a full meal for her family, including organizing menu, writing grocery list, shopping for grocery items, and following recipe for 3 course meal     Time 4   Period Weeks   Status On-going     OT LONG TERM GOAL #6   Title Patient will complete ADL including shower with modified independence   Time 4   Period Weeks   Status On-going               Plan - 07/23/17 1502    Clinical Impression Statement Pt cancelled last session again stating "I thought it was at a different time."  Pt progressing toward goals - will need encouragement for carry through for HEP   Rehab Potential Good   OT Frequency 2x / week   OT Duration 4 weeks   OT Treatment/Interventions Self-care/ADL training;Moist Heat;Therapeutic exercise;Neuromuscular education;Energy conservation;DME and/or AE instruction;Manual Therapy;Therapist, nutritional;Therapeutic activities;Cognitive remediation/compensation;Patient/family education;Balance training   Plan Check HEP, address shoulder pain prn, simple meal prep in kitchen to assess self organization behaviors   Consulted and Agree with Plan of Care Patient      Patient will benefit from skilled therapeutic intervention in order to improve the following deficits and impairments:  Cardiopulmonary status limiting activity, Decreased activity tolerance, Decreased balance, Decreased cognition, Decreased coordination, Decreased safety awareness, Decreased range of motion, Decreased mobility, Decreased knowledge of use of DME, Decreased knowledge of precautions, Decreased endurance, Decreased strength, Impaired UE functional use, Impaired sensation, Impaired flexibility, Impaired perceived functional ability, Increased muscle spasms, Obesity, Pain  Visit  Diagnosis: Acute pain of right shoulder  Cognitive social or emotional deficit following cerebral infarction  Muscle weakness (generalized)  Stiffness of right shoulder, not elsewhere classified  Unsteadiness on feet    Problem List Patient Active Problem List   Diagnosis Date Noted  . Obesity, Class III, BMI 40-49.9 (morbid obesity) ([place])   . Hyperlipidemia   . Tobacco abuse   . Cerebral embolism with cerebral infarction 05/17/2017  . CVA (cerebral vascular accident) ([place]) 05/17/2017  . Nausea & vomiting 05/17/2017  . Hypertension 05/16/2017  . DJD (degenerative joint disease)  of knee 07/01/2014  . Bilateral knee pain 06/30/2014    Quay Burow , OTR/L 07/23/2017, 3:05 PM  [facility] [address] [place], [place], [address] Phone: [phone]   Fax:  [phone]  Name: Kathleen [name] MRN: [id number] Date of Birth: December 08, 1960

## 2017-07-24 ENCOUNTER — Ambulatory Visit: Payer: Medicare Other

## 2017-07-26 ENCOUNTER — Ambulatory Visit: Payer: Medicare Other | Admitting: Rehabilitation

## 2017-07-26 ENCOUNTER — Ambulatory Visit: Payer: Medicare Other | Admitting: Occupational Therapy

## 2017-07-26 ENCOUNTER — Encounter: Payer: Self-pay | Admitting: Rehabilitation

## 2017-07-26 ENCOUNTER — Encounter: Payer: Self-pay | Admitting: Occupational Therapy

## 2017-07-26 DIAGNOSIS — R2689 Other abnormalities of gait and mobility: Secondary | ICD-10-CM

## 2017-07-26 DIAGNOSIS — R42 Dizziness and giddiness: Secondary | ICD-10-CM | POA: Diagnosis not present

## 2017-07-26 DIAGNOSIS — M25612 Stiffness of left shoulder, not elsewhere classified: Secondary | ICD-10-CM

## 2017-07-26 DIAGNOSIS — M6281 Muscle weakness (generalized): Secondary | ICD-10-CM

## 2017-07-26 DIAGNOSIS — R2681 Unsteadiness on feet: Secondary | ICD-10-CM

## 2017-07-26 DIAGNOSIS — M25511 Pain in right shoulder: Secondary | ICD-10-CM

## 2017-07-26 DIAGNOSIS — I69315 Cognitive social or emotional deficit following cerebral infarction: Secondary | ICD-10-CM

## 2017-07-26 DIAGNOSIS — M25611 Stiffness of right shoulder, not elsewhere classified: Secondary | ICD-10-CM

## 2017-07-26 NOTE — Therapy (Signed)
[facility] [address] [place], [place], [address] Phone: [phone]   Fax:  [phone]  Occupational Therapy Treatment  Patient Details  Name: Kathleen [name] MRN: [id number] Date of Birth: [birth year] Referring Provider: Jerilynn Mages. Hairston  Encounter Date: 07/26/2017      OT End of Session - 07/26/17 1637    Visit Number 4   Number of Visits 9   Date for OT Re-Evaluation 08/26/17   Authorization Type medicare - Gcode and PN every 10th visit   Authorization - Visit Number 4   Authorization - Number of Visits 10   OT Start Time 7628   OT Stop Time 1620   OT Time Calculation (min) 50 min   Activity Tolerance Patient tolerated treatment well   Behavior During Therapy WFL for tasks assessed/performed      Past Medical History:  Diagnosis Date  . Hypertension    NO MED MD TO GIVE RX  . Obesity     Past Surgical History:  Procedure Laterality Date  . CARPAL TUNNEL RELEASE  01/12/2012   Procedure: CARPAL TUNNEL RELEASE;  Surgeon: Schuyler Amor, MD;  Location: [facility];  Service: Orthopedics;  Laterality: Right;  . CESAREAN SECTION    . NOSE SURGERY     BROKEN   MANY YRS AGO   . TUBAL LIGATION    . WRIST FRACTURE SURGERY  Feb 2013    There were no vitals filed for this visit.      Subjective Assessment - 07/26/17 1532    Subjective  I haven't been having any pain in my right shoulder.   Pertinent History see epic;  L CVA 6/18   Patient Stated Goals to get my arm working better;  worked at [employer] part time and would like to go back.   Currently in Pain? Yes   Pain Score 8    Pain Location Knee   Pain Orientation Right;Left   Pain Descriptors / Indicators Aching   Pain Type Chronic pain   Pain Onset More than a month ago   Pain Frequency Constant   Aggravating Factors  walking   Pain Relieving Factors aleve                      OT Treatments/Exercises (OP) - 07/26/17 0001      ADLs   Cooking Patient able to plan a meal and create a grocery list for the meal .  Patient continues with tangential thought process.  Patient with severe knee pain due to arthritis.  Patient has difficulty standing for any length of time.  Sheutilizes a stool in the kitchen to allow her to sit and prepare food.     ADL Comments Patient with consisten report of very poor sleep.  Encouraged her to call her primary MD and explain that current prescribed sleep aide is not helping.  In the past patient took a muscle relaxant (knee pain) this helped her sleep but made her feel drowsy for two days.  Discussed the importance of sleep to help with brain healing.  Asked patient to journal her sleep patterns over next few days.  Discussed sleep hygiene, and encouraged patient to keep a pad of paper to jot down her thoughts and concerns so she does not perseverate on them all night.       Neurological Re-education Exercises   Other Exercises 1 Reviewed HEP for shoulder exercises.  Reinforced previously taught concepts of moving slowly,  and respecting pain.                  OT Education - 07/26/17 1636    Education provided Yes   Education Details discussed importance of sleep.  Reviewed HEP   Person(s) Educated Patient   Methods Explanation;Demonstration;Verbal cues   Comprehension Verbalized understanding;Returned demonstration;Need further instruction             OT Long Term Goals - 07/26/17 1640      OT LONG TERM GOAL #1   Title Patient will complete a home exercise program designed to improve range of motion in right shoulder.   Status Achieved     OT LONG TERM GOAL #2   Title Patient will complete simple housekeeping task  for 15+ minutes with modified independence   Status Achieved     OT LONG TERM GOAL #3   Title Patient will report pain no greater than 3/10 when reaching into shoulder height cabinet to retrieve/ place a 3 lb object with right hand   Status On-going     OT  LONG TERM GOAL #4   Title Patient will improve right hand strength to aide with ADL/IADL - GRIP BY 5 LB, [place] BY 2 LB   Status On-going               Plan - 07/26/17 1637    Clinical Impression Statement Patient continues with poor sleep, and decreased activity tolerance.  Patient attempted to return to her two part time jobs this week.  She felt thsi was not successful.  Patient has significant reduction in right shoulder pain - now reporting left shoulder pain.     Rehab Potential Good   OT Frequency 2x / week   OT Duration 4 weeks   OT Treatment/Interventions Self-care/ADL training;Moist Heat;Therapeutic exercise;Neuromuscular education;Energy conservation;DME and/or AE instruction;Manual Therapy;Therapist, nutritional;Therapeutic activities;Cognitive remediation/compensation;Patient/family education;Balance training   Plan NMR RUE - Needs proximal stability and better control.  Work in pain free ranges   Consulted and Agree with Plan of Care Patient      Patient will benefit from skilled therapeutic intervention in order to improve the following deficits and impairments:  Cardiopulmonary status limiting activity, Decreased activity tolerance, Decreased balance, Decreased cognition, Decreased coordination, Decreased safety awareness, Decreased range of motion, Decreased mobility, Decreased knowledge of use of DME, Decreased knowledge of precautions, Decreased endurance, Decreased strength, Impaired UE functional use, Impaired sensation, Impaired flexibility, Impaired perceived functional ability, Increased muscle spasms, Obesity, Pain  Visit Diagnosis: Acute pain of right shoulder  Cognitive social or emotional deficit following cerebral infarction  Muscle weakness (generalized)  Stiffness of right shoulder, not elsewhere classified  Unsteadiness on feet  Stiffness of left shoulder, not elsewhere classified    Problem List Patient Active Problem List   Diagnosis  Date Noted  . Obesity, Class III, BMI 40-49.9 (morbid obesity) ([place])   . Hyperlipidemia   . Tobacco abuse   . Cerebral embolism with cerebral infarction 05/17/2017  . CVA (cerebral vascular accident) ([place]) 05/17/2017  . Nausea & vomiting 05/17/2017  . Hypertension 05/16/2017  . DJD (degenerative joint disease) of knee 07/01/2014  . Bilateral knee pain 06/30/2014    Mariah Milling, OTR/L 07/26/2017, 4:42 PM  [facility] [address] [place], [place], [address] Phone: [phone]   Fax:  [phone]  Name: HENRY DEMERITT MRN: [id number] Date of Birth: February 15, 1961

## 2017-07-26 NOTE — Therapy (Signed)
[facility] [address] [place], [place], [address] Phone: [phone]   Fax:  [phone]  Physical Therapy Treatment  Patient Details  Name: Kathleen [name] MRN: [id number] Date of Birth: 1960-12-27 Referring Provider: Dr. Braulio Conte  Encounter Date: 07/26/2017      PT End of Session - 07/26/17 1714    Visit Number 2   Number of Visits 9   Date for PT Re-Evaluation 08/11/17   Authorization Type Medicare primary and Medicaid secondary per pt (emailed [place] [employer] [place] to verify). G-CODE AND PN EVERY 10TH VISIT.    PT Start Time 1448   PT Stop Time 1530   PT Time Calculation (min) 42 min   Activity Tolerance Patient tolerated treatment well   Behavior During Therapy WFL for tasks assessed/performed      Past Medical History:  Diagnosis Date  . Hypertension    NO MED MD TO GIVE RX  . Obesity     Past Surgical History:  Procedure Laterality Date  . CARPAL TUNNEL RELEASE  01/12/2012   Procedure: CARPAL TUNNEL RELEASE;  Surgeon: Schuyler Amor, MD;  Location: [facility];  Service: Orthopedics;  Laterality: Right;  . CESAREAN SECTION    . NOSE SURGERY     BROKEN   MANY YRS AGO   . TUBAL LIGATION    . WRIST FRACTURE SURGERY  Feb 2013    There were no vitals filed for this visit.      Subjective Assessment - 07/26/17 1453    Subjective Pt reports knee pain is bad today and Aleve is wearing off.  Also pt reports increased fatigue from returning to part time jobs.     Pertinent History HTN, DJD of B knees, HLD, obesity   Patient Stated Goals Improve endurance, balance, and walking.    Currently in Pain? Yes   Pain Score 8    Pain Location Knee   Pain Orientation Right;Left   Pain Descriptors / Indicators Aching   Pain Type Chronic pain   Pain Onset More than a month ago   Pain Frequency Intermittent   Aggravating Factors  walking    Pain Relieving Factors resting, aleve            [facility] PT Assessment  - 07/26/17 1457      6 Minute Walk- Baseline   6 Minute Walk- Baseline yes   BP (mmHg) 95/68   HR (bpm) 77   02 Sat (%RA) 96 %   Modified Borg Scale for Dyspnea 0- Nothing at all   Perceived Rate of Exertion (Borg) 9- very light     6 Minute walk- Post Test   6 Minute Walk Post Test yes   BP (mmHg) 99/77   HR (bpm) 92   02 Sat (%RA) 99 %   Modified Borg Scale for Dyspnea 9- Extremely severe  per pt report   Perceived Rate of Exertion (Borg) 19-Very, very hard  per pt report     6 minute walk test results    Aerobic Endurance Distance Walked 1206   Endurance additional comments Pt with antalgic gait pattern from knee pain, no overt LOB.  Pt maintained conversation despite being told to focus on walking      Standardized Balance Assessment   Standardized Balance Assessment Dynamic Gait Index     Dynamic Gait Index   Level Surface Normal   Change in Gait Speed Normal   Gait with Horizontal Head Turns Normal   Gait  with Vertical Head Turns Mild Impairment   Gait and Pivot Turn Mild Impairment   Step Over Obstacle Mild Impairment   Step Around Obstacles Mild Impairment   Steps Moderate Impairment   Total Score 18   DGI comment: Scores of 19 or less are predictive of falls in older community living adults                             PT Education - 07/26/17 1713    Education provided Yes   Education Details walking program to improve endurance, results of balance test    Person(s) Educated Patient   Methods Explanation          PT Short Term Goals - 07/12/17 1755      PT SHORT TERM GOAL #1   Title same as LTGs.           PT Long Term Goals - 07/26/17 1715      PT LONG TERM GOAL #1   Title Pt will be IND in HEP to improve strength, balance, and flexilibity. TARGET DATE FOR ALL LTGS: 08/09/17   Status New     PT LONG TERM GOAL #2   Title Pt will improve DGI to >19/24 in order to indicate decreased fall risk.     Status Revised     PT  LONG TERM GOAL #3   Title Pt will report zero falls in the last 4 weeks to improve safety during functional mobility.    Status New     PT LONG TERM GOAL #4   Title Pt will improve 6MWT distance by 200' in order to indicate improved functional endurance.     Status New     PT LONG TERM GOAL #5   Title Pt will amb. 600' over even/uneven terrain, IND, without LOB in order to improve functional mobility and visit friends.   Status New               Plan - 07/26/17 1714    Clinical Impression Statement Skilled session focused on addressing concerns with pt returning to work too soon, performing 6MWT to formally look at endurance with distance of 1206' indicative of decreased functional endurance, and DGI with score of 18/24 indicative of elevated fall risk.     Rehab Potential Good   PT Frequency 2x / week   PT Duration 4 weeks   PT Treatment/Interventions ADLs/Self Care Home Management;Biofeedback;Canalith Repostioning;Electrical Stimulation;Neuromuscular re-education;Balance training;Cognitive remediation;Therapeutic exercise;Therapeutic activities;Manual techniques;Functional mobility training;Stair training;Gait training;DME Instruction;Orthotic Fit/Training;Patient/family education;Vestibular   PT Next Visit Plan Initiate balance, strengthening (hamstrings and hip), flexibility HEP.   Consulted and Agree with Plan of Care Patient      Patient will benefit from skilled therapeutic intervention in order to improve the following deficits and impairments:  Abnormal gait, Decreased endurance, Decreased knowledge of use of DME, Decreased strength, Impaired UE functional use, Obesity, Decreased balance, Decreased mobility, Dizziness, Impaired flexibility  Visit Diagnosis: Muscle weakness (generalized)  Unsteadiness on feet  Other abnormalities of gait and mobility     Problem List Patient Active Problem List   Diagnosis Date Noted  . Obesity, Class III, BMI 40-49.9 (morbid  obesity) ([facility])   . Hyperlipidemia   . Tobacco abuse   . Cerebral embolism with cerebral infarction 05/17/2017  . CVA (cerebral vascular accident) ([facility]) 05/17/2017  . Nausea & vomiting 05/17/2017  . Hypertension 05/16/2017  . DJD (degenerative joint disease) of knee 07/01/2014  .  Bilateral knee pain 06/30/2014    Cameron Sprang, PT, MPT [facility] [address] [place], [place], [address] Phone: [phone]   Fax:  [phone] 07/26/17, 5:17 PM  Name: Kathleen [name] MRN: [id number] Date of Birth: 10-Mar-1961

## 2017-07-31 ENCOUNTER — Ambulatory Visit: Payer: Medicare Other | Admitting: Diagnostic Neuroimaging

## 2017-08-01 ENCOUNTER — Ambulatory Visit: Payer: Medicare Other

## 2017-08-01 ENCOUNTER — Ambulatory Visit: Payer: Medicare Other | Admitting: Occupational Therapy

## 2017-08-01 VITALS — BP 118/81 | HR 75

## 2017-08-01 DIAGNOSIS — M25511 Pain in right shoulder: Secondary | ICD-10-CM | POA: Diagnosis not present

## 2017-08-01 DIAGNOSIS — R42 Dizziness and giddiness: Secondary | ICD-10-CM | POA: Diagnosis not present

## 2017-08-01 DIAGNOSIS — R2681 Unsteadiness on feet: Secondary | ICD-10-CM | POA: Diagnosis not present

## 2017-08-01 DIAGNOSIS — R2689 Other abnormalities of gait and mobility: Secondary | ICD-10-CM | POA: Diagnosis not present

## 2017-08-01 DIAGNOSIS — M25612 Stiffness of left shoulder, not elsewhere classified: Secondary | ICD-10-CM | POA: Diagnosis not present

## 2017-08-01 DIAGNOSIS — M6281 Muscle weakness (generalized): Secondary | ICD-10-CM

## 2017-08-01 NOTE — Therapy (Signed)
[facility] [address] [place], [place], [address] Phone: [phone]   Fax:  [phone]  Physical Therapy Treatment  Patient Details  Name: Kathleen [name] MRN: [id number] Date of Birth: 12-04-1960 Referring Provider: Dr. Braulio Conte  Encounter Date: 08/01/2017      PT End of Session - 08/01/17 1209    Visit Number 3   Number of Visits 9   Date for PT Re-Evaluation 08/11/17   Authorization Type Medicare primary and Medicaid secondary per pt (emailed [place] and [place] to verify). G-CODE AND PN EVERY 10TH VISIT.    PT Start Time 1026  pt late   PT Stop Time 1059   PT Time Calculation (min) 33 min   Activity Tolerance Other (comment)  pt very tearful   Behavior During Therapy --  Pt very tearful during session      Past Medical History:  Diagnosis Date  . Hypertension    NO MED MD TO GIVE RX  . Obesity     Past Surgical History:  Procedure Laterality Date  . CARPAL TUNNEL RELEASE  01/12/2012   Procedure: CARPAL TUNNEL RELEASE;  Surgeon: Schuyler Amor, MD;  Location: [facility];  Service: Orthopedics;  Laterality: Right;  . CESAREAN SECTION    . NOSE SURGERY     BROKEN   MANY YRS AGO   . TUBAL LIGATION    . WRIST FRACTURE SURGERY  Feb 2013    Vitals:   08/01/17 1027  BP: 118/81  Pulse: 75        Subjective Assessment - 08/01/17 1027    Subjective Pt arrived late. Pt reported she's had HAs since last visit. Pt reported she feels depressed, she has days she doesn't want to go out of her house. Pt is no longer a morning person, and is not sleeping at night. Pt reports BP has been good but hasn't checked it in the lsat few days. Pt became tearful several times during session.    Pertinent History HTN, DJD of B knees, HLD, obesity   Patient Stated Goals Improve endurance, balance, and walking.    Currently in Pain? Yes   Pain Score 7    Pain Location Leg   Pain Orientation Right;Left   Pain Descriptors  / Indicators Aching  mid thigh to knees   Pain Type Chronic pain   Pain Onset More than a month ago   Pain Frequency Constant   Aggravating Factors  walking   Pain Relieving Factors aleve   Multiple Pain Sites Yes   Pain Score 7   Pain Location Head   Pain Orientation Posterior   Pain Descriptors / Indicators Headache   Pain Type Chronic pain  since CVA   Pain Onset More than a month ago   Pain Frequency Intermittent   Aggravating Factors  not sure   Pain Relieving Factors lying down                            Self Care:     PT Education - 08/01/17 1204    Education provided Yes   Education Details Extensive time spent on pt education, as pt very tearful and upset during session. PT provided pt with CVA education and [employer] information. PT explained to pt that feelings of sadness and frustration are common after CVA and she needs to discuss with MD and discuss difficulty sleeping. PT explained the importance of going back to  [employer] after PT in order to continue to maximize gains made during PT.    Person(s) Educated Patient   Methods Explanation;Handout;Verbal cues   Comprehension Verbalized understanding;Need further instruction          PT Short Term Goals - 07/12/17 1755      PT SHORT TERM GOAL #1   Title same as LTGs.           PT Long Term Goals - 07/26/17 1715      PT LONG TERM GOAL #1   Title Pt will be IND in HEP to improve strength, balance, and flexilibity. TARGET DATE FOR ALL LTGS: 08/09/17   Status New     PT LONG TERM GOAL #2   Title Pt will improve DGI to >19/24 in order to indicate decreased fall risk.     Status Revised     PT LONG TERM GOAL #3   Title Pt will report zero falls in the last 4 weeks to improve safety during functional mobility.    Status New     PT LONG TERM GOAL #4   Title Pt will improve 6MWT distance by 200' in order to indicate improved functional endurance.     Status New     PT LONG TERM GOAL #5    Title Pt will amb. 600' over even/uneven terrain, IND, without LOB in order to improve functional mobility and visit friends.   Status New               Plan - 08/01/17 1210    Clinical Impression Statement Today's skilled session focused on pt education and the importance of f/u with MD regarding feelings of sadness, frustration and difficulty sleeping. PT unable to have pt perform testing/HEP today, as pt very tearful. Therefore, session focused on pt ed. Pt's BP was WNL. Continue with POC.    Rehab Potential Good   PT Frequency 2x / week   PT Duration 4 weeks   PT Treatment/Interventions ADLs/Self Care Home Management;Biofeedback;Canalith Repostioning;Electrical Stimulation;Neuromuscular re-education;Balance training;Cognitive remediation;Therapeutic exercise;Therapeutic activities;Manual techniques;Functional mobility training;Stair training;Gait training;DME Instruction;Orthotic Fit/Training;Patient/family education;Vestibular   PT Next Visit Plan Initiate balance, strengthening (hamstrings and hip), flexibility HEP.   Consulted and Agree with Plan of Care Patient      Patient will benefit from skilled therapeutic intervention in order to improve the following deficits and impairments:  Abnormal gait, Decreased endurance, Decreased knowledge of use of DME, Decreased strength, Impaired UE functional use, Obesity, Decreased balance, Decreased mobility, Dizziness, Impaired flexibility  Visit Diagnosis: Other abnormalities of gait and mobility     Problem List Patient Active Problem List   Diagnosis Date Noted  . Obesity, Class III, BMI 40-49.9 (morbid obesity) ([facility])   . Hyperlipidemia   . Tobacco abuse   . Cerebral embolism with cerebral infarction 05/17/2017  . CVA (cerebral vascular accident) ([facility]) 05/17/2017  . Nausea & vomiting 05/17/2017  . Hypertension 05/16/2017  . DJD (degenerative joint disease) of knee 07/01/2014  . Bilateral knee pain 06/30/2014     Miller,Jennifer L 08/01/2017, 12:11 PM  [facility] [address] [place], [place], [address] Phone: [phone]   Fax:  [phone]  Name: Kathleen [name] MRN: [id number] Date of Birth: 06-15-1961  Geoffry Paradise, PT,DPT 08/01/17 12:12 PM Phone: [phone] Fax: [phone]

## 2017-08-01 NOTE — Patient Instructions (Addendum)
[name] offers a variety of Wellness options for adults from Group Exercise, Personal Trianing, cancer survivor programs and more! Text Message updates: Text "[email or web address]" to [phone] to receive updates from [place] on the latest happenings at the [facility]. We do limit these messages to our most important updates and will not spam you. For more information contact Casimiro Needle, [name] Director at [phone].   Ischemic Stroke An ischemic stroke (cerebrovascular accident, or CVA) is the sudden death of brain tissue that occurs when an area of the brain does not get enough oxygen. It is a medical emergency that must be treated right away. An ischemic stroke can cause permanent loss of brain function. This can cause problems with how different parts of your body function. What are the causes? This condition is caused by a decrease of oxygen supply to an area of the brain, which may be the result of:  A small blood clot (embolus) or a buildup of plaque in the blood vessels (atherosclerosis) that blocks blood flow in the brain.  An abnormal heart rhythm (atrial fibrillation).  A blocked or damaged artery in the head or neck.  What increases the risk? Certain factors may make you more likely to develop this condition. Some of these factors are things that you can change, such as:  Obesity.  Smoking cigarettes.  Taking oral birth control, especially if you also use tobacco.  Physical inactivity.  Excessive alcohol use.  Use of illegal drugs, especially cocaine and methamphetamine.  Other risk factors include:  High blood pressure (hypertension).  High cholesterol.  Diabetes mellitus.  Heart disease.  Being [place] American, Native American, Hispanic, or [place] Native.  Being over age 64.  Family history of stroke.  Previous history of blood clots, stroke, or transient ischemic attack (TIA).  Sickle cell disease.  Being a woman with a history  of preeclampsia.  Migraine headache.  Sleep apnea.  Irregular heartbeats, such as atrial fibrillation.  Chronic inflammatory diseases, such as rheumatoid arthritis or lupus.  Blood clotting disorders (hypercoagulable state).  What are the signs or symptoms? Symptoms of this condition usually develop suddenly, or you may notice them after waking up from sleep. Symptoms may include sudden:  Weakness or numbness in your face, arm, or leg, especially on one side of your body.  Trouble walking or difficulty moving your arms or legs.  Loss of balance or coordination.  Confusion.  Slurred speech (dysarthria).  Trouble speaking, understanding speech, or both (aphasia).  Vision changes-such as double vision, blurred vision, or loss of vision-inone or both eyes.  Dizziness.  Nausea and vomiting.  Severe headache with no known cause. The headache is often described as the worst headache ever experienced.  If possible, make note of the exact time that you last felt like your normal self and what time your symptoms started. Tell your health care provider. If symptoms come and go, this could be a sign of a warning stroke, or TIA. Get help right away, even if you feel better. How is this diagnosed? This condition may be diagnosed based on:  Your symptoms, your medical history, and a physical exam.  CT scan of the brain.  MRI.  CT angiogram. This test uses a computer to take X-rays of your arteries. A dye may be injected into your blood to show the inside of your blood vessels more clearly.  MRI angiogram. This is a type of MRI that is used to evaluate the blood vessels.  Cerebral angiogram. This test uses X-rays and a dye to show the blood vessels in the brain and neck.  You may need to see a health care provider who specializes in stroke care. A stroke specialist can be seen in person or through communication using telephone or television technology (telemedicine). Other tests  may also be done to find the cause of the stroke, such as:  Electrocardiogram (ECG).  Continuous heart monitoring.  Echocardiogram.  Carotid ultrasound.  A scan of the brain circulation.  Blood tests.  Sleep study to check for sleep apnea.  How is this treated? Treatment for this condition will depend on the duration, severity, and cause of your symptoms and on the area of the brain affected. It is very important to get treatment at the first sign of stroke symptoms. Some treatments work better if they are done within 3-6 hours of the onset of stroke symptoms. These initial treatments may include:  Aspirin.  Medicines to control blood pressure.  Medicine given by injection to dissolve the blood clot (thrombolytic).  Treatments given directly to the affected artery to remove or dissolve the blood clot.  Other treatment options may include:  Oxygen.  IV fluids.  Medicines to thin the blood (anticoagulants or antiplatelets).  Procedures to increase blood flow.  Medicines and changes to your diet may be used to help treat and manage risk factors for stroke, such as diabetes, high cholesterol, and high blood pressure. After a stroke, you may work with physical, speech, mental health, or occupational therapists to help you recover. Follow these instructions at home: Medicines  Take over-the-counter and prescription medicines only as told by your health care provider.  If you were told to take a medicine to thin your blood, such as aspirin or an anticoagulant, take it exactly as told by your health care provider. ? Taking too much blood-thinning medicine can cause bleeding. ? If you do not take enough blood-thinning medicine, you will not have the protection that you need against another stroke and other problems.  Understand the side effects of taking anticoagulant medicine. When taking this type of medicine, make sure you: ? Hold pressure over any cuts for longer than  usual. ? Tell your dentist and other health care providers that you are taking anticoagulants before you have any procedures that may cause bleeding. ? Avoid activities that may cause trauma or injury. Eating and drinking  Follow instructions from your health care provider about diet.  Eat healthy foods.  If your ability to swallow was affected by the stroke, you may need to take steps to avoid choking, such as: ? Taking small bites when eating. ? Eating foods that are soft or pureed. Safety  Follow instructions from your health care team about physical activity.  Use a walker or cane as told by your health care provider.  Take steps to create a safe home environment in order to reduce the risk of falls. This may include: ? Having your home looked at by specialists. ? Installing grab bars in the bedroom and bathroom. ? Using safety equipment, such as raised toilets and a seat in the shower. General instructions  Do not use any tobacco products, such as cigarettes, chewing tobacco, and e-cigarettes. If you need help quitting, ask your health care provider.  Limit alcohol intake to no more than 1 drink a day for nonpregnant women and 2 drinks a day for men. One drink equals 12 oz of beer, 5 oz of wine, or 1  oz of hard liquor.  If you need help to stop using drugs or alcohol, ask your health care provider about a referral to a program or specialist.  Maintain an active and healthy lifestyle. Get regular exercise as told by your health care provider.  Keep all follow-up visits as told by your health care provider, including visits with all specialists on your health care team. This is important. How is this prevented? Your risk of another stroke can be decreased by managing high blood pressure, high cholesterol, diabetes, heart disease, sleep apnea, and obesity. It can also be decreased by quitting smoking, limiting alcohol, and staying physically active. Your health care provider  will continue to work with you on measures to prevent short-term and long-term complications of stroke. Get help right away if: You have:  Sudden weakness or numbness in your face, arm, or leg, especially on one side of your body.  Sudden confusion.  Sudden trouble speaking, understanding, or both (aphasia).  Sudden trouble seeing with one or both eyes.  Sudden trouble walking or difficulty moving your arms or legs.  Sudden dizziness.  Sudden loss of balance or coordination.  Sudden, severe headache with no known cause.  A partial or total loss of consciousness.  A seizure. Any of these symptoms may represent a serious problem that is an emergency. Do not wait to see if the symptoms will go away. Get medical help right away. Call your local emergency services (911 in [place] Do not drive yourself to the hospital. This information is not intended to replace advice given to you by your health care provider. Make sure you discuss any questions you have with your health care provider. Document Released: 11/19/2005 Document Revised: 05/01/2016 Document Reviewed: 02/15/2016 Elsevier Interactive Patient Education  2017 [employer].

## 2017-08-01 NOTE — Therapy (Signed)
[facility] [address] [place], [place], [address] Phone: [phone]   Fax:  [phone]  Occupational Therapy Treatment  Patient Details  Name: Kathleen [name] MRN: [id number] Date of Birth: August 07, 1961 Referring Provider: Jerilynn Mages. [facility]on  Encounter Date: 08/01/2017      OT End of Session - 08/01/17 1224    Visit Number 5   Number of Visits 9   Date for OT Re-Evaluation 08/26/17   Authorization Type medicare - Gcode and PN every 10th visit   Authorization - Visit Number 5   Authorization - Number of Visits 10   OT Start Time 1100   OT Stop Time 1145   OT Time Calculation (min) 45 min   Activity Tolerance Patient tolerated treatment well      Past Medical History:  Diagnosis Date  . Hypertension    NO MED MD TO GIVE RX  . Obesity     Past Surgical History:  Procedure Laterality Date  . CARPAL TUNNEL RELEASE  01/12/2012   Procedure: CARPAL TUNNEL RELEASE;  Surgeon: Schuyler Amor, MD;  Location: [facility];  Service: Orthopedics;  Laterality: Right;  . CESAREAN SECTION    . NOSE SURGERY     BROKEN   MANY YRS AGO   . TUBAL LIGATION    . WRIST FRACTURE SURGERY  Feb 2013    There were no vitals filed for this visit.      Subjective Assessment - 08/01/17 1206    Pertinent History see epic;  L CVA 6/18   Patient Stated Goals to get my arm working better;  worked at [employer] part time and would like to go back.   Currently in Pain? Yes   Pain Score --  fluctuates   Pain Location Shoulder   Pain Orientation Left   Pain Descriptors / Indicators Aching   Pain Type Chronic pain   Pain Onset More than a month ago   Pain Frequency Intermittent   Aggravating Factors  descending movement into sh. ext   Pain Relieving Factors distraction                      OT Treatments/Exercises (OP) - 08/01/17 0001      ADLs   ADL Comments Further discussed difficulty with sleeping and suggested  talking to MD re: sleep pattern and possible cause of menopause. Also advised pt to talk with MD re: anxiety causing insomnia. Discussed distraction techniques as well. Reviewed positioning with daily tasks to address bieps LUE including not holding heavy purse with elbow flexed.      Exercises   Exercises --  UBE x 5 min. level 1 for strength/endurance BUE's     Neurological Re-education Exercises   Other Exercises 1 Began supine for bilateral shoulder flex/ext with ball, however pt uncomfortable supine and therefore performed same ex seated - pt had no pain seated. However, noted while in supine, pt did have pain along Lt biceps upon palpation and while moving into extension - ? mild biceps tendonitis. Pt also reports tenderness middle deltoid.    Other Exercises 2 Seated: shoulder abduction with cane to Lt then Rt x 10 with cues for positioning. Followed by bilateral sh. extension x 10 reps for posterior sh. girdle. Pt had no pain with these ex's     Manual Therapy   Manual therapy comments Kinesiotaping Lt biceps and middle deltoid to relax muscles. Pt instructed in wear and care of taping.  OT Long Term Goals - 07/26/17 1640      OT LONG TERM GOAL #1   Title Patient will complete a home exercise program designed to improve range of motion in right shoulder.   Status Achieved     OT LONG TERM GOAL #2   Title Patient will complete simple housekeeping task  for 15+ minutes with modified independence   Status Achieved     OT LONG TERM GOAL #3   Title Patient will report pain no greater than 3/10 when reaching into shoulder height cabinet to retrieve/ place a 3 lb object with right hand   Status On-going     OT LONG TERM GOAL #4   Title Patient will improve right hand strength to aide with ADL/IADL - GRIP BY 5 LB, [place] BY 2 LB   Status On-going               Plan - 08/01/17 1225    Clinical Impression Statement Pt progressing with bilateral  shoulder ROM/tolerance to upper ranges if distracted. Pt does have pain with descending movement Lt shoulder and point tender along biceps - ? biceps tendonitis.    Rehab Potential Good   OT Frequency 2x / week   OT Duration 4 weeks   OT Treatment/Interventions Self-care/ADL training;Moist Heat;Therapeutic exercise;Neuromuscular education;Energy conservation;DME and/or AE instruction;Manual Therapy;Therapist, nutritional;Therapeutic activities;Cognitive remediation/compensation;Patient/family education;Balance training   Plan continue NMR RUE - proximal stability, continue to monitor and address LUE pain, assess taping   Consulted and Agree with Plan of Care Patient      Patient will benefit from skilled therapeutic intervention in order to improve the following deficits and impairments:  Cardiopulmonary status limiting activity, Decreased activity tolerance, Decreased balance, Decreased cognition, Decreased coordination, Decreased safety awareness, Decreased range of motion, Decreased mobility, Decreased knowledge of use of DME, Decreased knowledge of precautions, Decreased endurance, Decreased strength, Impaired UE functional use, Impaired sensation, Impaired flexibility, Impaired perceived functional ability, Increased muscle spasms, Obesity, Pain  Visit Diagnosis: Muscle weakness (generalized)  Stiffness of left shoulder, not elsewhere classified    Problem List Patient Active Problem List   Diagnosis Date Noted  . Obesity, Class III, BMI 40-49.9 (morbid obesity) ([facility])   . Hyperlipidemia   . Tobacco abuse   . Cerebral embolism with cerebral infarction 05/17/2017  . CVA (cerebral vascular accident) ([facility]) 05/17/2017  . Nausea & vomiting 05/17/2017  . Hypertension 05/16/2017  . DJD (degenerative joint disease) of knee 07/01/2014  . Bilateral knee pain 06/30/2014    Carey Bullocks, OTR/L 08/01/2017, 1:14 PM  [facility] [address] [place], [place], [address] Phone: [phone]   Fax:  [phone]  Name: DRU LAUREL MRN: [id number] Date of Birth: [birth year]

## 2017-08-02 ENCOUNTER — Ambulatory Visit: Payer: Medicare Other | Admitting: Occupational Therapy

## 2017-08-02 ENCOUNTER — Encounter: Payer: Self-pay | Admitting: Occupational Therapy

## 2017-08-02 DIAGNOSIS — R2681 Unsteadiness on feet: Secondary | ICD-10-CM | POA: Diagnosis not present

## 2017-08-02 DIAGNOSIS — R2689 Other abnormalities of gait and mobility: Secondary | ICD-10-CM | POA: Diagnosis not present

## 2017-08-02 DIAGNOSIS — M25612 Stiffness of left shoulder, not elsewhere classified: Secondary | ICD-10-CM

## 2017-08-02 DIAGNOSIS — M25511 Pain in right shoulder: Secondary | ICD-10-CM | POA: Diagnosis not present

## 2017-08-02 DIAGNOSIS — I69315 Cognitive social or emotional deficit following cerebral infarction: Secondary | ICD-10-CM

## 2017-08-02 DIAGNOSIS — M25611 Stiffness of right shoulder, not elsewhere classified: Secondary | ICD-10-CM

## 2017-08-02 DIAGNOSIS — M6281 Muscle weakness (generalized): Secondary | ICD-10-CM

## 2017-08-02 DIAGNOSIS — R42 Dizziness and giddiness: Secondary | ICD-10-CM | POA: Diagnosis not present

## 2017-08-02 NOTE — Therapy (Signed)
[facility] [address] [place], [place], [address] Phone: [phone]   Fax:  [phone]  Occupational Therapy Treatment  Patient Details  Name: Kathleen [name] MRN: [id number] Date of Birth: [birth year] Referring Provider: Jerilynn Mages. Hairston  Encounter Date: 08/02/2017      OT End of Session - 08/02/17 1635    Visit Number 6   Number of Visits 9   Date for OT Re-Evaluation 08/26/17   Authorization Type medicare - Gcode and PN every 10th visit   Authorization - Visit Number 6   Authorization - Number of Visits 10   OT Start Time 6144   OT Stop Time 1600   OT Time Calculation (min) 30 min   Activity Tolerance Patient tolerated treatment well   Behavior During Therapy WFL for tasks assessed/performed      Past Medical History:  Diagnosis Date  . Hypertension    NO MED MD TO GIVE RX  . Obesity     Past Surgical History:  Procedure Laterality Date  . CARPAL TUNNEL RELEASE  01/12/2012   Procedure: CARPAL TUNNEL RELEASE;  Surgeon: Schuyler Amor, MD;  Location: [facility];  Service: Orthopedics;  Laterality: Right;  . CESAREAN SECTION    . NOSE SURGERY     BROKEN   MANY YRS AGO   . TUBAL LIGATION    . WRIST FRACTURE SURGERY  Feb 2013    There were no vitals filed for this visit.      Subjective Assessment - 08/02/17 1626    Subjective  Both shoulders feel good.  She taught me how I was lifting wrong.     Pertinent History see epic;  L CVA 6/18   Patient Stated Goals to get my arm working better;  worked at [employer] part time and would like to go back.   Currently in Pain? No/denies   Pain Score 0-No pain                      OT Treatments/Exercises (OP) - 08/02/17 0001      ADLs   Bathing Patient has been unable to obtain a tub bench as needed for her shower.  Patient has a tub seat, but it does not fit properly in her tub.  Patient has reached out to MD to try to obtain order for tub  bench.  Sent Dr Braulio Conte message regarding this during OT session.     Cooking Patient is independently cooking for her family.     Driving Patient has returned to driving.     Work Patient has returned to part time jobs.       Neck Exercises: Machines for Strengthening   UBE (Upper Arm Bike) level 3, 3 min forward and 3 min backward.      Neurological Re-education Exercises   Hand Gripper with Large Beads Able to move 15 blocks on level 3 before dropping.  At level 2, patient able to complete x 5 min.                  OT Education - 08/02/17 1634    Education provided Yes   Education Details reviewed remaining OT goals and discussed discharge - patient agreeable and very pleased with progress.  Encouraged patient to seek further medical assistance for sleep issues.     Person(s) Educated Patient   Methods Explanation   Comprehension Verbalized understanding  OT Long Term Goals - 2017/08/19 1637      OT LONG TERM GOAL #6   Title Patient will complete ADL including shower with modified independence   Status Achieved  still would be safer with tub bench               Plan - 08/19/2017 1636    Clinical Impression Statement Patient has met OT goals and is agreeable to discharge   Rehab Potential Good   OT Frequency 2x / week   OT Duration 4 weeks   OT Treatment/Interventions Self-care/ADL training;Moist Heat;Therapeutic exercise;Neuromuscular education;Energy conservation;DME and/or AE instruction;Manual Therapy;Therapist, nutritional;Therapeutic activities;Cognitive remediation/compensation;Patient/family education;Balance training   Plan discharge   Consulted and Agree with Plan of Care Patient      Patient will benefit from skilled therapeutic intervention in order to improve the following deficits and impairments:  Cardiopulmonary status limiting activity, Decreased activity tolerance, Decreased balance, Decreased cognition, Decreased  coordination, Decreased safety awareness, Decreased range of motion, Decreased mobility, Decreased knowledge of use of DME, Decreased knowledge of precautions, Decreased endurance, Decreased strength, Impaired UE functional use, Impaired sensation, Impaired flexibility, Impaired perceived functional ability, Increased muscle spasms, Obesity, Pain  Visit Diagnosis: Muscle weakness (generalized)  Stiffness of left shoulder, not elsewhere classified  Unsteadiness on feet  Acute pain of right shoulder  Cognitive social or emotional deficit following cerebral infarction  Stiffness of right shoulder, not elsewhere classified      G-Codes - Aug 19, 2017 1638    Functional Assessment Tool Used (Outpatient only) skilled clinical observation    Functional Limitation Self care   Self Care Current Status (D5686) At least 1 percent but less than 20 percent impaired, limited or restricted   Self Care Goal Status (H6837) At least 1 percent but less than 20 percent impaired, limited or restricted   Self Care Discharge Status [phone]) At least 1 percent but less than 20 percent impaired, limited or restricted      Problem List Patient Active Problem List   Diagnosis Date Noted  . Obesity, Class III, BMI 40-49.9 (morbid obesity) ([facility])   . Hyperlipidemia   . Tobacco abuse   . Cerebral embolism with cerebral infarction 05/17/2017  . CVA (cerebral vascular accident) ([facility]) 05/17/2017  . Nausea & vomiting 05/17/2017  . Hypertension 05/16/2017  . DJD (degenerative joint disease) of knee 07/01/2014  . Bilateral knee pain 06/30/2014   OCCUPATIONAL THERAPY DISCHARGE SUMMARY  Visits from Start of Care: 6  Current functional level related to goals / functional outcomes: Independent with ADL/IADL   Remaining deficits:  Arthritis knees Education / Equipment: HEP for right shoulder  Plan: Patient agrees to discharge.  Patient goals were partially met. Patient is being discharged due to meeting the  stated rehab goals.  ?????        Mariah Milling , OTR/L 08-19-2017, 4:38 PM  [facility] [address] [place], [place], [address] Phone: [phone]   Fax:  [phone]  Name: JAMILIA JACQUES MRN: [id number] Date of Birth: February 02, 1961

## 2017-08-06 ENCOUNTER — Telehealth: Payer: Self-pay | Admitting: Family Medicine

## 2017-08-06 DIAGNOSIS — E785 Hyperlipidemia, unspecified: Secondary | ICD-10-CM

## 2017-08-06 DIAGNOSIS — I10 Essential (primary) hypertension: Secondary | ICD-10-CM

## 2017-08-06 MED ORDER — HYDROCHLOROTHIAZIDE 25 MG PO TABS: 25.0000 mg | ORAL_TABLET | Freq: Every day | ORAL | 0 refills | Status: DC

## 2017-08-06 MED ORDER — ATORVASTATIN CALCIUM 40 MG PO TABS: 40.0000 mg | ORAL_TABLET | Freq: Every day | ORAL | 0 refills | Status: DC

## 2017-08-06 MED ORDER — VALSARTAN 160 MG PO TABS: 160.0000 mg | ORAL_TABLET | Freq: Every day | ORAL | 0 refills | Status: DC

## 2017-08-06 NOTE — Telephone Encounter (Signed)
Requested medications refilled 

## 2017-08-06 NOTE — Telephone Encounter (Signed)
Pt called to request a refill for the following med. valsartan (DIOVAN) 160 MG tablet  hydrochlorothiazide (HYDRODIURIL) 25 MG tablet  atorvastatin (LIPITOR) 40 MG tablet  Please she need the refill since she only has 2 pill, can you sent  To the [facility]  pharmacy

## 2017-08-08 ENCOUNTER — Encounter: Payer: Medicare Other | Admitting: Occupational Therapy

## 2017-08-08 ENCOUNTER — Other Ambulatory Visit: Payer: Self-pay | Admitting: Family Medicine

## 2017-08-08 ENCOUNTER — Ambulatory Visit: Payer: Medicare Other | Admitting: Physical Therapy

## 2017-08-08 DIAGNOSIS — R1310 Dysphagia, unspecified: Secondary | ICD-10-CM

## 2017-08-08 DIAGNOSIS — Z8673 Personal history of transient ischemic attack (TIA), and cerebral infarction without residual deficits: Secondary | ICD-10-CM

## 2017-08-12 ENCOUNTER — Encounter: Payer: Medicare Other | Admitting: Occupational Therapy

## 2017-08-12 ENCOUNTER — Ambulatory Visit: Payer: Medicare Other | Admitting: Rehabilitation

## 2017-08-13 ENCOUNTER — Telehealth: Payer: Self-pay | Admitting: Diagnostic Neuroimaging

## 2017-08-13 ENCOUNTER — Ambulatory Visit (INDEPENDENT_AMBULATORY_CARE_PROVIDER_SITE_OTHER): Payer: Medicare Other | Admitting: Diagnostic Neuroimaging

## 2017-08-13 ENCOUNTER — Encounter: Payer: Self-pay | Admitting: Diagnostic Neuroimaging

## 2017-08-13 VITALS — BP 94/60 | HR 61 | Ht 66.0 in | Wt 301.4 lb

## 2017-08-13 DIAGNOSIS — I639 Cerebral infarction, unspecified: Secondary | ICD-10-CM | POA: Diagnosis not present

## 2017-08-13 MED ORDER — ALPRAZOLAM 0.5 MG PO TABS: 0.5000 mg | ORAL_TABLET | ORAL | 0 refills | Status: DC | PRN

## 2017-08-13 NOTE — Progress Notes (Signed)
[facility]  PATIENT: Kathleen [name] DOB: 1961/11/16  REFERRING CLINICIAN: Barton Dubois HISTORY FROM: patient and chart review  REASON FOR VISIT: new consult    HISTORICAL  CHIEF COMPLAINT:  Chief Complaint  Patient presents with  . NP  Internal Referral   [facility]  . Cerebrovascular Accident      OT done, PT in progress.  She has concerns of forgetfulness, sleep issues.  Would like rec for another pcp    HISTORY OF PRESENT ILLNESS:   56 year old female here for evaluation of stroke hospital discharge follow-up. Patient has history of hypertension, anxiety, obesity.  05/14/17 patient had onset of headache, dizziness, slurred speech. 05/16/17, patient saw PCP. 05/17/17 patient presented to the emergency room. CT scan of the head demonstrated subacute left cerebellar stroke. This was confirmed on MRI. Patient had stroke workup completed.  Since that time patient continues to have constellation of symptoms including memory loss, forgetfulness, sleep problems, weight gain, shortness of breath, joint pain, racing thoughts.  Patient is looking to establish with a new primary care physician.  Patient is tolerating her medications including aspirin, atorvastatin, valsartan.   REVIEW OF SYSTEMS: Full 14 system review of systems performed and negative with exception of: As per history of present illness.  ALLERGIES: Allergies  Allergen Reactions  . Hydrocodone Itching  . Lisinopril Cough  . Tramadol Itching    HOME MEDICATIONS: Outpatient Medications Prior to Visit  Medication Sig Dispense Refill  . aspirin 325 MG tablet Take 1 tablet (325 mg total) by mouth daily. 90 tablet 3  . atorvastatin (LIPITOR) 40 MG tablet Take 1 tablet (40 mg total) by mouth daily at 6 PM. 30 tablet 0  . hydrochlorothiazide (HYDRODIURIL) 25 MG tablet Take 1 tablet (25 mg total) by mouth daily. 30 tablet 0  . meloxicam (MOBIC) 7.5 MG tablet Take 1 tablet (7.5 mg total) by mouth  daily. 30 tablet 2  . traZODone (DESYREL) 50 MG tablet Take 0.5-1 tablets (25-50 mg total) by mouth at bedtime as needed for sleep. 30 tablet 0  . valsartan (DIOVAN) 160 MG tablet Take 1 tablet (160 mg total) by mouth daily. 30 tablet 0  . albuterol (PROVENTIL HFA;VENTOLIN HFA) 108 (90 BASE) MCG/ACT inhaler Inhale 2 puffs into the lungs every 4 (four) hours as needed for wheezing or shortness of breath. (Patient not taking: Reported on 08/13/2017) 1 Inhaler 0  . meclizine (ANTIVERT) 12.5 MG tablet Take 1 tablet (12.5 mg total) by mouth 3 (three) times daily. (Patient not taking: Reported on 08/13/2017) 90 tablet 0  . nicotine (NICODERM CQ - DOSED IN MG/24 HOURS) 21 mg/24hr patch Place 1 patch (21 mg total) onto the skin daily. (Patient not taking: Reported on 08/13/2017) 30 patch 1  . ondansetron (ZOFRAN) 4 MG tablet Take 1 tablet (4 mg total) by mouth every 8 (eight) hours as needed for nausea or vomiting. (Patient not taking: Reported on 08/13/2017) 20 tablet 0  . Vitamin D, Ergocalciferol, (DRISDOL) 50000 units CAPS capsule Take 50,000 Units by mouth once a week.   2  . aspirin 325 MG tablet Take 1 tablet (325 mg total) by mouth daily. 30 tablet 1   No facility-administered medications prior to visit.     PAST MEDICAL HISTORY: Past Medical History:  Diagnosis Date  . Anxiety   . Hypertension    NO MED MD TO GIVE RX  . Obesity     PAST SURGICAL HISTORY: Past Surgical History:  Procedure Laterality Date  .  CARPAL TUNNEL RELEASE  01/12/2012   Procedure: CARPAL TUNNEL RELEASE;  Surgeon: Schuyler Amor, MD;  Location: [facility];  Service: Orthopedics;  Laterality: Right;  . CESAREAN SECTION    . NOSE SURGERY     BROKEN   MANY YRS AGO   . TUBAL LIGATION    . WRIST FRACTURE SURGERY  Feb 2013    FAMILY HISTORY: Family History  Problem Relation Age of Onset  . Hypertension Father   . Diabetes Father     SOCIAL HISTORY:  Social History   Social History  . Marital status: Widowed     Spouse name: N/A  . Number of children: N/A  . Years of education: N/A   Occupational History  . Not on file.   Social History Main Topics  . Smoking status: Former Smoker    Quit date: 05/17/2017  . Smokeless tobacco: Never Used     Comment: Smoking about 6 cigs/week  . Alcohol use 12.6 oz/week    21 Glasses of wine per week     Comment: DAILY ALCOHOL and WINE  . Drug use: No  . Sexual activity: Yes    Birth control/ protection: None   Other Topics Concern  . Not on file   Social History Narrative   Lives home with S.O. Vivien Presto.  Education level 12th grade.    Drinks coffee.  Has one child.      PHYSICAL EXAM  GENERAL EXAM/CONSTITUTIONAL: Vitals:  Vitals:   08/13/17 0849  BP: 94/60  Pulse: 61  Weight: (!) 301 lb 6.4 oz (136.7 kg)  Height: 5\' 6"  (1.676 m)     Body mass index is 48.65 kg/m.  No exam data present  Patient is in no distress; well developed, nourished and groomed; neck is supple  CARDIOVASCULAR:  Examination of carotid arteries is normal; no carotid bruits  Regular rate and rhythm, no murmurs  Examination of peripheral vascular system by observation and palpation is normal  EYES:  Ophthalmoscopic exam of optic discs and posterior segments is normal; no papilledema or hemorrhages  MUSCULOSKELETAL:  Gait, strength, tone, movements noted in Neurologic exam below  NEUROLOGIC: MENTAL STATUS:  No flowsheet data found.  awake, alert, oriented to person, place and time  recent and remote memory intact  normal attention and concentration  language fluent, comprehension intact, naming intact,   fund of knowledge appropriate  CRANIAL NERVE:   2nd - no papilledema on fundoscopic exam  2nd, 3rd, 4th, 6th - pupils equal and reactive to light, visual fields full to confrontation, extraocular muscles intact, no nystagmus  5th - facial sensation symmetric  7th - facial strength symmetric  8th - hearing intact  9th - palate  elevates symmetrically, uvula midline  11th - shoulder shrug symmetric  12th - tongue protrusion midline  MOTOR:   normal bulk and tone, full strength in the BUE, BLE  SENSORY:   normal and symmetric to light touch, temperature, vibration  COORDINATION:   finger-nose-finger, fine finger movements normal  REFLEXES:   deep tendon reflexes TRACE and symmetric  GAIT/STATION:   narrow based gait; SLOW GAIT    DIAGNOSTIC DATA (LABS, IMAGING, TESTING) - I reviewed patient records, labs, notes, testing and imaging myself where available.  Lab Results  Component Value Date   WBC 8.8 06/19/2017   HGB 12.3 06/19/2017   HCT 38.5 06/19/2017   MCV 84 06/19/2017   PLT 228 06/19/2017      Component Value Date/Time   NA  142 06/19/2017 1157   K 3.8 06/19/2017 1157   CL 102 06/19/2017 1157   CO2 27 06/19/2017 1157   GLUCOSE 91 06/19/2017 1157   GLUCOSE 103 (H) 05/19/2017 0450   BUN 15 06/19/2017 1157   CREATININE 0.72 06/19/2017 1157   CREATININE 0.85 12/28/2015 1435   CALCIUM 11.5 (H) 06/19/2017 1157   PROT 7.0 06/19/2017 1157   ALBUMIN 4.1 06/19/2017 1157   AST 18 06/19/2017 1157   ALT 19 06/19/2017 1157   ALKPHOS 105 06/19/2017 1157   BILITOT 0.3 06/19/2017 1157   GFRNONAA 95 06/19/2017 1157   GFRNONAA 79 07/29/2015 0935   GFRAA 110 06/19/2017 1157   GFRAA >89 07/29/2015 0935   Lab Results  Component Value Date   CHOL 141 06/19/2017   HDL 58 06/19/2017   LDLCALC 70 06/19/2017   TRIG 66 06/19/2017   CHOLHDL 2.4 06/19/2017   Lab Results  Component Value Date   HGBA1C 5.6 05/18/2017   No results found for: [id number] Lab Results  Component Value Date   TSH 1.950 06/19/2017    05/18/17 TTE - Left ventricle: The cavity size was normal. Wall thickness was   increased in a pattern of moderate LVH. Systolic function was   vigorous. The estimated ejection fraction was in the range of 65%   to 70%. Wall motion was normal; there were no regional wall   motion  abnormalities. Doppler parameters are consistent with   abnormal left ventricular relaxation (grade 1 diastolic   dysfunction). Doppler parameters are consistent with high   ventricular filling pressure. - Aortic valve: Valve area (VTI): 3.08 cm^2. Valve area (Vmax):   2.45 cm^2. Valve area (Vmean): 2.44 cm^2. - Atrial septum: No defect or patent foramen ovale was identified.  05/18/17 carotid u/s - Bilateral: mild mixed plaque origin ICA. 1-39% ICA plaquing. Vertebral artery flow is antegrade.  05/23/17 cardiac monitor (30 day) Sinus rhythm Occasional nocturnal sinus bradycardia is observed No atrial fibrillation is observed though baseline artifact limits interpretation at times No AV block or pauses No sustained arrhythmias  06/28/17 BLE u/s - Technically difficult due to body habitus. - No evidence of deep vein or superficial thrombosis involving the   right lower extremity and left common femoral vein. - No evidence of Baker&'s cyst on the right.     ASSESSMENT AND PLAN  56 y.o. year old female here with New onset left cerebellar ischemic infarction, embolic appearance, with unknown source. Will complete embolic stroke workup.    Dx:  1. Cerebellar stroke (HCC)      PLAN:  STROKE PREVENTION (embolic, left cerebellar, unknown source) - continue aspirin, statin, BP control - check MRA neck (evaluate posterior circulation) - check TEE; if negative then setup implanted loop recorder - check sleep study  MIND RACING / ANXIETY / INSOMNIA - consider sertraline 25mg  daily - consider psychology evaluation - follow up with PCP  OBESITY  - nutrition and fitness strategies reviewed - consider referral to medical weight mgmt clinic (Dr. Leafy Ro)  Meds ordered this encounter  Medications  . ALPRAZolam (XANAX) 0.5 MG tablet    Sig: Take 1 tablet (0.5 mg total) by mouth as needed for anxiety (for sedation before MRI scan; take 1 hour before scan; may repeat 15 min before  scan).    Dispense:  3 tablet    Refill:  0   Orders Placed This Encounter  Procedures  . MR MRA NECK W WO CONTRAST  . Ambulatory referral to Cardiology  .  Ambulatory referral to Sleep Studies   Return in about 4 months (around 12/13/2017).  I reviewed images, labs, notes, records myself. I summarized findings and reviewed with patient, for this high risk condition (stroke) requiring high complexity decision making.     Penni Bombard, MD 5/63/1497, 0:26 AM Certified in Neurology, Neurophysiology and Neuroimaging  [facility] [address], [place], [place] [address] [phone]

## 2017-08-13 NOTE — Telephone Encounter (Signed)
Patient was seen today and needs a note for her employer. She needs the note addressed to [place] at [employer] [facility] to say she can work 3 days a week. Please call patient at [phone] when ready for pickup.

## 2017-08-13 NOTE — Patient Instructions (Signed)
  STROKE PREVENTION (embolic, left cerebellar, unknown source) - continue aspirin, statin, BP control - check MRA neck (evaluate posterior circulation) - check TEE (heart ultrasound); if negative then setup implanted loop recorder - check sleep study  MIND RACING / ANXIETY / INSOMNIA - consider sertraline 25mg  daily - consider psychology evaluation - follow up with PCP  OBESITY  - nutrition and fitness strategies reviewed - consider referral to medical weight mgmt clinic (Dr. Leafy Ro) - follow up with PCP

## 2017-08-14 ENCOUNTER — Encounter: Payer: Self-pay | Admitting: *Deleted

## 2017-08-14 NOTE — Telephone Encounter (Signed)
Ok to write letter. -VRP

## 2017-08-14 NOTE — Telephone Encounter (Signed)
Done, sent to [place] desk for review and signature.

## 2017-08-15 NOTE — Telephone Encounter (Signed)
Spoke to pt and letter placed up front for pick up.

## 2017-08-16 ENCOUNTER — Ambulatory Visit: Payer: Medicare Other | Admitting: Rehabilitation

## 2017-08-20 ENCOUNTER — Ambulatory Visit: Payer: Medicare Other | Admitting: Family Medicine

## 2017-08-27 ENCOUNTER — Ambulatory Visit: Payer: Medicare Other | Admitting: Physical Therapy

## 2017-08-29 NOTE — Therapy (Signed)
[facility] [address] [place], [place], [address] Phone: [phone]   Fax:  [phone]  Patient Details  Name: Kathleen [name] MRN: [id number] Date of Birth: 1961/10/12 Referring Provider:  No ref. provider found  Encounter Date: 09-01-2017   PHYSICAL THERAPY DISCHARGE SUMMARY  Visits from Start of Care: 3  Current functional level related to goals / functional outcomes:     PT Long Term Goals - 07/26/17 1715      PT LONG TERM GOAL #1   Title Pt will be IND in HEP to improve strength, balance, and flexilibity. TARGET DATE FOR ALL LTGS: 08/09/17   Status New     PT LONG TERM GOAL #2   Title Pt will improve DGI to >19/24 in order to indicate decreased fall risk.     Status Revised     PT LONG TERM GOAL #3   Title Pt will report zero falls in the last 4 weeks to improve safety during functional mobility.    Status New     PT LONG TERM GOAL #4   Title Pt will improve 6MWT distance by 200' in order to indicate improved functional endurance.     Status New     PT LONG TERM GOAL #5   Title Pt will amb. 600' over even/uneven terrain, IND, without LOB in order to improve functional mobility and visit friends.   Status New        Remaining deficits: Unknown, as pt did not return since last visit.    Education / Equipment: HEP  Plan: Patient agrees to discharge.  Patient goals were not met. Patient is being discharged due to not returning since the last visit.  ?????              G-Codes - Sep 01, 2017 1335    Functional Assessment Tool Used (Outpatient Only) Neuro QOL LE: 34.8; gait speed: 3.42f/sec no AD; TUG time: 12.89 sec. no AD-same as eval as pt did not return   Functional Limitation Mobility: Walking and moving around   Mobility: Walking and Moving Around Goal Status [phone] At least 1 percent but less than 20 percent impaired, limited or restricted   Mobility: Walking and Moving Around  Discharge Status [phone] At least 20 percent but less than 40 percent impaired, limited or restricted       Aundray Cartlidge L 9Sep 30, 2018 1:36 PM  [facility] [address]Phone: [phone]  Fax:  [phone]

## 2017-09-02 ENCOUNTER — Telehealth: Payer: Self-pay | Admitting: Family Medicine

## 2017-09-02 NOTE — Telephone Encounter (Signed)
Patient no showed last appt so I cannot refill this. Will defer to [facility]. Of note, the tainted valsartan has been removed from the market but could switch to another ARB (losartan, etc) if patient prefers.

## 2017-09-02 NOTE — Telephone Encounter (Signed)
Patient called requesting medication refill on valsartan (DIOVAN) 160 MG tablet and hydrochlorothiazide (HYDRODIURIL) 25 MG tablet . Pt states she only has enough medication until thursday and will be going out of town Friday, Pt is also concerns with medication valsartan, pt states she has heard that it is causing cancer and would like to know if we could switch medication. Please f/up

## 2017-09-03 ENCOUNTER — Other Ambulatory Visit: Payer: Self-pay | Admitting: Family Medicine

## 2017-09-03 ENCOUNTER — Institutional Professional Consult (permissible substitution): Payer: Medicare Other | Admitting: Neurology

## 2017-09-03 ENCOUNTER — Ambulatory Visit: Payer: Medicare Other | Admitting: Physical Therapy

## 2017-09-03 DIAGNOSIS — E785 Hyperlipidemia, unspecified: Secondary | ICD-10-CM

## 2017-09-03 DIAGNOSIS — I10 Essential (primary) hypertension: Secondary | ICD-10-CM

## 2017-09-03 MED ORDER — LOSARTAN POTASSIUM 100 MG PO TABS: 100.0000 mg | ORAL_TABLET | Freq: Every day | ORAL | 0 refills | Status: AC

## 2017-09-03 MED ORDER — ATORVASTATIN CALCIUM 40 MG PO TABS: 40.0000 mg | ORAL_TABLET | Freq: Every day | ORAL | 0 refills | Status: AC

## 2017-09-03 MED ORDER — HYDROCHLOROTHIAZIDE 25 MG PO TABS: 25.0000 mg | ORAL_TABLET | Freq: Every day | ORAL | 0 refills | Status: DC

## 2017-09-03 NOTE — Telephone Encounter (Signed)
Pt called saying she was ok with the losartan. Please fu with [facility]

## 2017-09-03 NOTE — Telephone Encounter (Signed)
Will refill for 30 days supply. Of note tainted valsartan has been recalled and removed from the market. She can be switched to losartan if she prefers. Recommend she schedule an office visit considering her PMH with PCP to re-evaluate BP within the next 1 to 2 weeks.

## 2017-09-03 NOTE — Telephone Encounter (Signed)
Kathleen [name] has already sent the losartan to [facility]. No further action needed.

## 2017-09-12 ENCOUNTER — Inpatient Hospital Stay: Admission: RE | Admit: 2017-09-12 | Payer: Medicare Other | Source: Ambulatory Visit

## 2017-09-19 ENCOUNTER — Encounter: Payer: Self-pay | Admitting: Cardiovascular Disease

## 2017-09-19 ENCOUNTER — Ambulatory Visit (INDEPENDENT_AMBULATORY_CARE_PROVIDER_SITE_OTHER): Payer: Medicare Other | Admitting: Cardiovascular Disease

## 2017-09-19 VITALS — BP 102/74 | HR 70 | Ht 66.0 in | Wt 300.0 lb

## 2017-09-19 DIAGNOSIS — E785 Hyperlipidemia, unspecified: Secondary | ICD-10-CM | POA: Diagnosis not present

## 2017-09-19 DIAGNOSIS — I639 Cerebral infarction, unspecified: Secondary | ICD-10-CM

## 2017-09-19 DIAGNOSIS — I63442 Cerebral infarction due to embolism of left cerebellar artery: Secondary | ICD-10-CM | POA: Diagnosis not present

## 2017-09-19 DIAGNOSIS — I7090 Unspecified atherosclerosis: Secondary | ICD-10-CM | POA: Diagnosis not present

## 2017-09-19 DIAGNOSIS — G479 Sleep disorder, unspecified: Secondary | ICD-10-CM

## 2017-09-19 DIAGNOSIS — I10 Essential (primary) hypertension: Secondary | ICD-10-CM | POA: Diagnosis not present

## 2017-09-19 MED ORDER — HYDROCHLOROTHIAZIDE 25 MG PO TABS: 12.5000 mg | ORAL_TABLET | Freq: Every day | ORAL | 0 refills | Status: AC

## 2017-09-19 NOTE — Patient Instructions (Signed)
Medication Instructions:  DECREASE HCTZ to 12.5 mg (1/2 tablet) daily  Testing/Procedures: Your physician has requested that you have a TEE (November 6th with Dr. Sallyanne Kuster). During a TEE, sound waves are used to create images of your heart. It provides your doctor with information about the size and shape of your heart and how well your heart's chambers and valves are working. In this test, a transducer is attached to the end of a flexible tube that's guided down your throat and into your esophagus (the tube leading from you mouth to your stomach) to get a more detailed image of your heart. You are not awake for the procedure. Please see the instruction sheet given to you today. For further information please visit [email or web address].   Follow-Up: Your physician recommends that you schedule a follow-up appointment in: End of November/beginning of December with Dr. Claiborne Billings.   Any Other Special Instructions Will Be Listed Below (If Applicable).     If you need a refill on your cardiac medications before your next appointment, please call your pharmacy.

## 2017-09-19 NOTE — Progress Notes (Signed)
Cardiology Office Note    Date:  09/21/2017   ID:  Kathleen [name], DOB 09/26/1961, MRN [id number]  PCP:  Alfonse Spruce, FNP  Cardiologist:  Shelva Majestic, MD    New cardiology evaluation, referred by Dr. Elgie Congo following a recent stroke.  History of Present Illness:  Kathleen [name] is a 56 y.o. female who developed new onset of headaches, dizziness and slurred speech in June 2018 and ultimately presented to the emergency room on 05/17/2017.  A CT of her head demonstrated a subacute left cerebellar stroke which was confirmed on MRI imaging.  She had a constellation of symptoms including memory loss, forgetfulness, sleep problems, racing thoughts, and also admitted to weight gain and shortness of breath.  During her hospitalization, an echo Doppler study showed an EF of 65-70% and there was grade 1 diastolic dysfunction.  There was no significant valvular pathology and there was no evidence for atrial septal defect or PFO on transthoracic echo.  Carotid duplex imaging showed bilateral mixed plaque with 1-39% narrowing.  She had antegrade vertebral flow.  Cardiac telemetry monitoring showed occasional nocturnal sinus bradycardia.  There no episodes of atrial fibrillation, there were no episodes of AV block pauses or any sustained arrhythmias.  She has a history of hypertension, obesity, hyperlipidemia, and tobacco use.  She recently saw Dr. Leta Baptist in follow-up of her hospitalization.  She has been undergoing PT and occupational therapy.  Lower extremity Doppler studies did not show evidence for deep vein or superficial thrombosis.  As part of her evaluation for new onset left cerebellar ischemic infarction with embolic appearance from unknown source.  She is now referred for cardiology evaluation for possible TEE and potential loop recorder if the TEE is negative.  She is also been scheduled for a sleep study.  Past Medical History:  Diagnosis Date  . Anxiety   . Hypertension    NO  MED MD TO GIVE RX  . Obesity     Past Surgical History:  Procedure Laterality Date  . CARPAL TUNNEL RELEASE  01/12/2012   Procedure: CARPAL TUNNEL RELEASE;  Surgeon: Schuyler Amor, MD;  Location: [facility];  Service: Orthopedics;  Laterality: Right;  . CESAREAN SECTION    . NOSE SURGERY     BROKEN   MANY YRS AGO   . TUBAL LIGATION    . WRIST FRACTURE SURGERY  Feb 2013    Current Medications: Outpatient Medications Prior to Visit  Medication Sig Dispense Refill  . albuterol (PROVENTIL HFA;VENTOLIN HFA) 108 (90 BASE) MCG/ACT inhaler Inhale 2 puffs into the lungs every 4 (four) hours as needed for wheezing or shortness of breath. 1 Inhaler 0  . ALPRAZolam (XANAX) 0.5 MG tablet Take 1 tablet (0.5 mg total) by mouth as needed for anxiety (for sedation before MRI scan; take 1 hour before scan; may repeat 15 min before scan). 3 tablet 0  . aspirin 325 MG tablet Take 1 tablet (325 mg total) by mouth daily. 90 tablet 3  . atorvastatin (LIPITOR) 40 MG tablet Take 1 tablet (40 mg total) by mouth daily at 6 PM. 30 tablet 0  . losartan (COZAAR) 100 MG tablet Take 1 tablet (100 mg total) by mouth daily. 30 tablet 0  . meloxicam (MOBIC) 7.5 MG tablet Take 1 tablet (7.5 mg total) by mouth daily. 30 tablet 2  . traZODone (DESYREL) 50 MG tablet Take 0.5-1 tablets (25-50 mg total) by mouth at bedtime as needed for sleep. 30 tablet 0  .  Vitamin D, Ergocalciferol, (DRISDOL) 50000 units CAPS capsule Take 50,000 Units by mouth once a week.   2  . hydrochlorothiazide (HYDRODIURIL) 25 MG tablet Take 1 tablet (25 mg total) by mouth daily. 30 tablet 0  . meclizine (ANTIVERT) 12.5 MG tablet Take 1 tablet (12.5 mg total) by mouth 3 (three) times daily. (Patient not taking: Reported on 09/19/2017) 90 tablet 0  . nicotine (NICODERM CQ - DOSED IN MG/24 HOURS) 21 mg/24hr patch Place 1 patch (21 mg total) onto the skin daily. (Patient not taking: Reported on 08/13/2017) 30 patch 1  . ondansetron (ZOFRAN) 4 MG tablet Take 1  tablet (4 mg total) by mouth every 8 (eight) hours as needed for nausea or vomiting. (Patient not taking: Reported on 09/19/2017) 20 tablet 0   No facility-administered medications prior to visit.      Allergies:   Hydrocodone; Lisinopril; and Tramadol   Social History   Social History  . Marital status: Widowed    Spouse name: N/A  . Number of children: N/A  . Years of education: N/A   Social History Main Topics  . Smoking status: Former Smoker    Quit date: 05/17/2017  . Smokeless tobacco: Never Used     Comment: Smoking about 6 cigs/week  . Alcohol use 12.6 oz/week    21 Glasses of wine per week     Comment: DAILY ALCOHOL and WINE  . Drug use: No  . Sexual activity: Yes    Birth control/ protection: None   Other Topics Concern  . None   Social History Narrative   Lives home with S.O. Vivien Presto.  Education level 12th grade.    Drinks coffee.  Has one child.     Additional social history is notable in that she was born in [place], [place].  She completed 12th grade of education.  She is in her second marriage and is remarried for 1 year.  She has one son who is 8 years old.  She currently works at [employer].  Family History:  The patient's family history includes Diabetes in her father; Hypertension in her father.  Mother is currently 43.  Her father, diabetes mellitus, status post lower extremity amputation, has suffered an MI as well as a history of CVA.  She has 3 sisters.  Twin  brothers both died from cancer.  ROS General: Negative; No fevers, chills, or night sweats; positive for morbid obesity HEENT: Negative; No changes in vision or hearing, sinus congestion, difficulty swallowing Pulmonary: Negative; No cough, wheezing, shortness of breath, hemoptysis Cardiovascular: Negative; No chest pain, presyncope, syncope, palpitations GI: Negative; No nausea, vomiting, diarrhea, or abdominal pain GU: Negative; No dysuria, hematuria, or difficulty  voiding Musculoskeletal: Negative; no myalgias, joint pain, or weakness Hematologic/Oncology: Negative; no easy bruising, bleeding Endocrine: Negative; no heat/cold intolerance; no diabetes Neuro: Negative; no changes in balance, headaches Skin: Negative; No rashes or skin lesions Psychiatric: Positive for racing thoughts. Sleep: Positive for snoring, nonrestorative sleep, nocturia 2-3 times per night, nodaytime sleepiness, hypersomnolence, bruxism, restless legs, hypnogognic hallucinations, no cataplexy Other comprehensive 14 point system review is negative.   PHYSICAL EXAM:   VS:  BP 102/74   Pulse 70   Ht 5' 6" (1.676 m)   Wt 300 lb (136.1 kg)   BMI 48.42 kg/m     Repeat blood pressure by me 106/70  Wt Readings from Last 3 Encounters:  09/19/17 300 lb (136.1 kg)  08/13/17 (!) 301 lb 6.4 oz (136.7 kg)  06/19/17 292 lb 9.6 oz (132.7 kg)    General: Alert, oriented, no distress; Morbidly obese Skin: normal turgor, no rashes, warm and dry HEENT: Normocephalic, atraumatic. Pupils equal round and reactive to light; sclera anicteric; extraocular muscles intact; Fundi No hemorrhages or exudates.  This not well-visualized Nose without nasal septal hypertrophy Mouth/Parynx benign; Mallinpatti scale 3/4 Neck: No JVD, no carotid bruits; normal carotid upstroke Lungs: clear to ausculatation and percussion; no wheezing or rales Chest wall: without tenderness to palpitation Heart: PMI not displaced, RRR, s1 s2 normal, faint 1/6 systolic murmur, no diastolic murmur, no rubs, gallops, thrills, or heaves Abdomen: Central adiposity;soft, nontender; no hepatosplenomehaly, BS+; abdominal aorta nontender and not dilated by palpation. Back: no CVA tenderness Pulses 2+ Musculoskeletal: full range of motion, normal strength, no joint deformities Extremities: no clubbing cyanosis or edema, Homan's sign negative  Neurologic: grossly nonfocal; Cranial nerves grossly wnl Psychologic: Normal mood and  affect   Studies/Labs Reviewed:   EKG:  EKG is ordered today.  ECG (independently read by me):Normal sinus rhythm at 70 bpm.  No ectopy.  Normal intervals.  Recent Labs: BMP Latest Ref Rng & Units 06/19/2017 05/19/2017 05/17/2017  Glucose 65 - 99 mg/dL 91 103(H) 121(H)  BUN 6 - 24 mg/dL _0 Creatinine 0.57 - 1.00 mg/dL 0.72 0.87 0.90  BUN/Creat Ratio 9 - 23 21 - -  Sodium 134 - 144 mmol/L 142 137 136  Potassium 3.5 - 5.2 mmol/L 3.8 4.0 3.7  Chloride 96 - 106 mmol/L 102 105 101  CO2 20 - 29 mmol/L _1 Calcium 8.7 - 10.2 mg/dL 11.5(H) 10.1 11.0(H)     Hepatic Function Latest Ref Rng & Units 06/19/2017 05/17/2017 05/16/2017  Total Protein 6.0 - 8.5 g/dL 7.0 7.4 7.0  Albumin 3.5 - 5.5 g/dL 4.1 3.9 4.3  AST 0 - 40 IU/L _2 ALT 0 - 32 IU/L _3 Alk Phosphatase 39 - 117 IU/L 105 69 79  Total Bilirubin 0.0 - 1.2 mg/dL 0.3 0.7 0.3    CBC Latest Ref Rng & Units 06/19/2017 05/19/2017 05/17/2017  WBC 3.4 - 10.8 x10E3/uL 8.8 7.7 9.3  Hemoglobin 11.1 - 15.9 g/dL 12.3 12.1 13.7  Hematocrit 34.0 - 46.6 % 38.5 38.8 44.8  Platelets 150 - 379 x10E3/uL 228 161 197   Lab Results  Component Value Date   MCV 84 06/19/2017   MCV 87.0 05/19/2017   MCV 87.0 05/17/2017   Lab Results  Component Value Date   TSH 1.950 06/19/2017   Lab Results  Component Value Date   HGBA1C 5.6 05/18/2017     BNP No results found for: BNP  ProBNP No results found for: PROBNP   Lipid Panel     Component Value Date/Time   CHOL 141 06/19/2017 1157   TRIG 66 06/19/2017 1157   HDL 58 06/19/2017 1157   CHOLHDL 2.4 06/19/2017 1157   CHOLHDL 3.3 05/18/2017 0114   VLDL 16 05/18/2017 0114   LDLCALC 70 06/19/2017 1157     RADIOLOGY: No results found.   Additional studies/ records that were reviewed today include:  I reviewed the patient's hospitalization from June 15 through June 17.  I reviewed.  Her head CT, MRI, 2-D echo Doppler study, carotid studies, and office notes from  neurology.  I have also reviewed laboratory.   ASSESSMENT:    1. Stroke due to embolism of left cerebellar artery ([place])   2. Essential hypertension   3. Hyperlipidemia,  unspecified hyperlipidemia type   4. Morbid obesity (HCC)   5. Atherosclerosis   6. Hypercalcemia   7. Sleeping difficulty      PLAN:  Kathleen [name] is a 56-year-old African-American female was a history of morbid obesity and develop symptoms secondary to acute left inferior cerebellum infarct without hemorrhage leading to her presentation to the hospital in June.  Of note, her MRA confirmed the acute left inferior cervical without hemorrhage, and also showed chronic microhemorrhage right medial thalamus.  She was also found to have mild intracranial atherosclerotic disease in the anterior cerebral arteries and right middle cerebral artery without large vessel occlusion.  A 2-D echo Doppler study did not reveal any significant pathology and she had hyperdynamic LV function with grade 1 diastolic dysfunction.  She is now on atorvastatin 40 mg, and LDL in July was 70.  Following initiation of therapy.  Note, her laboratory demonstrated elevation of calcium at 11.5.  It does not appear that she had had any assessment for possible hyperparathyroidism.  Her blood pressure today is controlled on losartan 100 mg, and HCTZ 25 mg daily.  She is not having any signs of edema.  I have suggested she reduce her HCTZ to 12.5 mg particular with her blood pressure at 106 and 102 as recorded in today's office visit.  To further evaluate the potential cryptogenic stroke, as requested by Dr. Penumalli I will schedule her for TEE to be done by Dr. Croitoru  for further evaluation.  If the TEE is negative, she will be scheduled for a loop recorder to allow for long-term follow-up for potential arrhythmic etiology.  She is morbidly obese.  Weight loss is necessary.  She is scheduled to undergo a sleep evaluation in neurology.  Prior to her stroke she  had been going to the gym at least 3 days per week and I encouraged increased activity.  With her subclinical atherosclerosis noted on her MRA target LDL is less than 70.  I will see her in 1- 2 months for follow-up evaluation.   Medication Adjustments/Labs and Tests Ordered: Current medicines are reviewed at length with the patient today.  Concerns regarding medicines are outlined above.  Medication changes, Labs and Tests ordered today are listed in the Patient Instructions below. Patient Instructions  Medication Instructions:  DECREASE HCTZ to 12.5 mg (1/2 tablet) daily  Testing/Procedures: Your physician has requested that you have a TEE (November 6th with Dr. Croitoru). During a TEE, sound waves are used to create images of your heart. It provides your doctor with information about the size and shape of your heart and how well your heart's chambers and valves are working. In this test, a transducer is attached to the end of a flexible tube that's guided down your throat and into your esophagus (the tube leading from you mouth to your stomach) to get a more detailed image of your heart. You are not awake for the procedure. Please see the instruction sheet given to you today. For further information please visit [email or web address].   Follow-Up: Your physician recommends that you schedule a follow-up appointment in: End of November/beginning of December with Dr. Rosiland Sen.   Any Other Special Instructions Will Be Listed Below (If Applicable).     If you need a refill on your cardiac medications before your next appointment, please call your pharmacy.      Signed, Alessia Gonsalez, MD  09/21/2017 7:19 PM    [facility] [address], [address],   [place], [place]  [address] Phone: [phone]

## 2017-09-19 NOTE — H&P (View-Only) (Signed)
Cardiology Office Note    Date:  09/21/2017   ID:  Kathleen [name], DOB 09/26/1961, MRN [id number]  PCP:  Alfonse Spruce, FNP  Cardiologist:  Shelva Majestic, MD    New cardiology evaluation, referred by Dr. Elgie Congo following a recent stroke.  History of Present Illness:  Kathleen [name] is a 56 y.o. female who developed new onset of headaches, dizziness and slurred speech in June 2018 and ultimately presented to the emergency room on 05/17/2017.  A CT of her head demonstrated a subacute left cerebellar stroke which was confirmed on MRI imaging.  She had a constellation of symptoms including memory loss, forgetfulness, sleep problems, racing thoughts, and also admitted to weight gain and shortness of breath.  During her hospitalization, an echo Doppler study showed an EF of 65-70% and there was grade 1 diastolic dysfunction.  There was no significant valvular pathology and there was no evidence for atrial septal defect or PFO on transthoracic echo.  Carotid duplex imaging showed bilateral mixed plaque with 1-39% narrowing.  She had antegrade vertebral flow.  Cardiac telemetry monitoring showed occasional nocturnal sinus bradycardia.  There no episodes of atrial fibrillation, there were no episodes of AV block pauses or any sustained arrhythmias.  She has a history of hypertension, obesity, hyperlipidemia, and tobacco use.  She recently saw Dr. Leta Baptist in follow-up of her hospitalization.  She has been undergoing PT and occupational therapy.  Lower extremity Doppler studies did not show evidence for deep vein or superficial thrombosis.  As part of her evaluation for new onset left cerebellar ischemic infarction with embolic appearance from unknown source.  She is now referred for cardiology evaluation for possible TEE and potential loop recorder if the TEE is negative.  She is also been scheduled for a sleep study.  Past Medical History:  Diagnosis Date  . Anxiety   . Hypertension    NO  MED MD TO GIVE RX  . Obesity     Past Surgical History:  Procedure Laterality Date  . CARPAL TUNNEL RELEASE  01/12/2012   Procedure: CARPAL TUNNEL RELEASE;  Surgeon: Schuyler Amor, MD;  Location: [facility];  Service: Orthopedics;  Laterality: Right;  . CESAREAN SECTION    . NOSE SURGERY     BROKEN   MANY YRS AGO   . TUBAL LIGATION    . WRIST FRACTURE SURGERY  Feb 2013    Current Medications: Outpatient Medications Prior to Visit  Medication Sig Dispense Refill  . albuterol (PROVENTIL HFA;VENTOLIN HFA) 108 (90 BASE) MCG/ACT inhaler Inhale 2 puffs into the lungs every 4 (four) hours as needed for wheezing or shortness of breath. 1 Inhaler 0  . ALPRAZolam (XANAX) 0.5 MG tablet Take 1 tablet (0.5 mg total) by mouth as needed for anxiety (for sedation before MRI scan; take 1 hour before scan; may repeat 15 min before scan). 3 tablet 0  . aspirin 325 MG tablet Take 1 tablet (325 mg total) by mouth daily. 90 tablet 3  . atorvastatin (LIPITOR) 40 MG tablet Take 1 tablet (40 mg total) by mouth daily at 6 PM. 30 tablet 0  . losartan (COZAAR) 100 MG tablet Take 1 tablet (100 mg total) by mouth daily. 30 tablet 0  . meloxicam (MOBIC) 7.5 MG tablet Take 1 tablet (7.5 mg total) by mouth daily. 30 tablet 2  . traZODone (DESYREL) 50 MG tablet Take 0.5-1 tablets (25-50 mg total) by mouth at bedtime as needed for sleep. 30 tablet 0  .  Vitamin D, Ergocalciferol, (DRISDOL) 50000 units CAPS capsule Take 50,000 Units by mouth once a week.   2  . hydrochlorothiazide (HYDRODIURIL) 25 MG tablet Take 1 tablet (25 mg total) by mouth daily. 30 tablet 0  . meclizine (ANTIVERT) 12.5 MG tablet Take 1 tablet (12.5 mg total) by mouth 3 (three) times daily. (Patient not taking: Reported on 09/19/2017) 90 tablet 0  . nicotine (NICODERM CQ - DOSED IN MG/24 HOURS) 21 mg/24hr patch Place 1 patch (21 mg total) onto the skin daily. (Patient not taking: Reported on 08/13/2017) 30 patch 1  . ondansetron (ZOFRAN) 4 MG tablet Take 1  tablet (4 mg total) by mouth every 8 (eight) hours as needed for nausea or vomiting. (Patient not taking: Reported on 09/19/2017) 20 tablet 0   No facility-administered medications prior to visit.      Allergies:   Hydrocodone; Lisinopril; and Tramadol   Social History   Social History  . Marital status: Widowed    Spouse name: N/A  . Number of children: N/A  . Years of education: N/A   Social History Main Topics  . Smoking status: Former Smoker    Quit date: 05/17/2017  . Smokeless tobacco: Never Used     Comment: Smoking about 6 cigs/week  . Alcohol use 12.6 oz/week    21 Glasses of wine per week     Comment: DAILY ALCOHOL and WINE  . Drug use: No  . Sexual activity: Yes    Birth control/ protection: None   Other Topics Concern  . None   Social History Narrative   Lives home with S.O. Vivien Presto.  Education level 12th grade.    Drinks coffee.  Has one child.     Additional social history is notable in that she was born in [place], [place].  She completed 12th grade of education.  She is in her second marriage and is remarried for 1 year.  She has one son who is 83 years old.  She currently works at [employer].  Family History:  The patient's family history includes Diabetes in her father; Hypertension in her father.  Mother is currently 25.  Her father, diabetes mellitus, status post lower extremity amputation, has suffered an MI as well as a history of CVA.  She has 3 sisters.  Twin  brothers both died from cancer.  ROS General: Negative; No fevers, chills, or night sweats; positive for morbid obesity HEENT: Negative; No changes in vision or hearing, sinus congestion, difficulty swallowing Pulmonary: Negative; No cough, wheezing, shortness of breath, hemoptysis Cardiovascular: Negative; No chest pain, presyncope, syncope, palpitations GI: Negative; No nausea, vomiting, diarrhea, or abdominal pain GU: Negative; No dysuria, hematuria, or difficulty  voiding Musculoskeletal: Negative; no myalgias, joint pain, or weakness Hematologic/Oncology: Negative; no easy bruising, bleeding Endocrine: Negative; no heat/cold intolerance; no diabetes Neuro: Negative; no changes in balance, headaches Skin: Negative; No rashes or skin lesions Psychiatric: Positive for racing thoughts. Sleep: Positive for snoring, nonrestorative sleep, nocturia 2-3 times per night, nodaytime sleepiness, hypersomnolence, bruxism, restless legs, hypnogognic hallucinations, no cataplexy Other comprehensive 14 point system review is negative.   PHYSICAL EXAM:   VS:  BP 102/74   Pulse 70   Ht 5' 6" (1.676 m)   Wt 300 lb (136.1 kg)   BMI 48.42 kg/m     Repeat blood pressure by me 106/70  Wt Readings from Last 3 Encounters:  09/19/17 300 lb (136.1 kg)  08/13/17 (!) 301 lb 6.4 oz (136.7 kg)  06/19/17 292 lb 9.6 oz (132.7 kg)    General: Alert, oriented, no distress; Morbidly obese Skin: normal turgor, no rashes, warm and dry HEENT: Normocephalic, atraumatic. Pupils equal round and reactive to light; sclera anicteric; extraocular muscles intact; Fundi No hemorrhages or exudates.  This not well-visualized Nose without nasal septal hypertrophy Mouth/Parynx benign; Mallinpatti scale 3/4 Neck: No JVD, no carotid bruits; normal carotid upstroke Lungs: clear to ausculatation and percussion; no wheezing or rales Chest wall: without tenderness to palpitation Heart: PMI not displaced, RRR, s1 s2 normal, faint 1/6 systolic murmur, no diastolic murmur, no rubs, gallops, thrills, or heaves Abdomen: Central adiposity;soft, nontender; no hepatosplenomehaly, BS+; abdominal aorta nontender and not dilated by palpation. Back: no CVA tenderness Pulses 2+ Musculoskeletal: full range of motion, normal strength, no joint deformities Extremities: no clubbing cyanosis or edema, Homan's sign negative  Neurologic: grossly nonfocal; Cranial nerves grossly wnl Psychologic: Normal mood and  affect   Studies/Labs Reviewed:   EKG:  EKG is ordered today.  ECG (independently read by me):Normal sinus rhythm at 70 bpm.  No ectopy.  Normal intervals.  Recent Labs: BMP Latest Ref Rng & Units 06/19/2017 05/19/2017 05/17/2017  Glucose 65 - 99 mg/dL 91 103(H) 121(H)  BUN 6 - 24 mg/dL _0 Creatinine 0.57 - 1.00 mg/dL 0.72 0.87 0.90  BUN/Creat Ratio 9 - 23 21 - -  Sodium 134 - 144 mmol/L 142 137 136  Potassium 3.5 - 5.2 mmol/L 3.8 4.0 3.7  Chloride 96 - 106 mmol/L 102 105 101  CO2 20 - 29 mmol/L _1 Calcium 8.7 - 10.2 mg/dL 11.5(H) 10.1 11.0(H)     Hepatic Function Latest Ref Rng & Units 06/19/2017 05/17/2017 05/16/2017  Total Protein 6.0 - 8.5 g/dL 7.0 7.4 7.0  Albumin 3.5 - 5.5 g/dL 4.1 3.9 4.3  AST 0 - 40 IU/L _2 ALT 0 - 32 IU/L _3 Alk Phosphatase 39 - 117 IU/L 105 69 79  Total Bilirubin 0.0 - 1.2 mg/dL 0.3 0.7 0.3    CBC Latest Ref Rng & Units 06/19/2017 05/19/2017 05/17/2017  WBC 3.4 - 10.8 x10E3/uL 8.8 7.7 9.3  Hemoglobin 11.1 - 15.9 g/dL 12.3 12.1 13.7  Hematocrit 34.0 - 46.6 % 38.5 38.8 44.8  Platelets 150 - 379 x10E3/uL 228 161 197   Lab Results  Component Value Date   MCV 84 06/19/2017   MCV 87.0 05/19/2017   MCV 87.0 05/17/2017   Lab Results  Component Value Date   TSH 1.950 06/19/2017   Lab Results  Component Value Date   HGBA1C 5.6 05/18/2017     BNP No results found for: BNP  ProBNP No results found for: PROBNP   Lipid Panel     Component Value Date/Time   CHOL 141 06/19/2017 1157   TRIG 66 06/19/2017 1157   HDL 58 06/19/2017 1157   CHOLHDL 2.4 06/19/2017 1157   CHOLHDL 3.3 05/18/2017 0114   VLDL 16 05/18/2017 0114   LDLCALC 70 06/19/2017 1157     RADIOLOGY: No results found.   Additional studies/ records that were reviewed today include:  I reviewed the patient's hospitalization from June 15 through June 17.  I reviewed.  Her head CT, MRI, 2-D echo Doppler study, carotid studies, and office notes from  neurology.  I have also reviewed laboratory.   ASSESSMENT:    1. Stroke due to embolism of left cerebellar artery ([place])   2. Essential hypertension   3. Hyperlipidemia,  unspecified hyperlipidemia type   4. Morbid obesity ([place])   5. Atherosclerosis   6. Hypercalcemia   7. Sleeping difficulty      PLAN:  Kathleen [name] is a 56 year old African-American female was a history of morbid obesity and develop symptoms secondary to acute left inferior cerebellum infarct without hemorrhage leading to her presentation to the hospital in June.  Of note, her MRA confirmed the acute left inferior cervical without hemorrhage, and also showed chronic microhemorrhage right medial thalamus.  She was also found to have mild intracranial atherosclerotic disease in the anterior cerebral arteries and right middle cerebral artery without large vessel occlusion.  A 2-D echo Doppler study did not reveal any significant pathology and she had hyperdynamic LV function with grade 1 diastolic dysfunction.  She is now on atorvastatin 40 mg, and LDL in July was 70.  Following initiation of therapy.  Note, her laboratory demonstrated elevation of calcium at 11.5.  It does not appear that she had had any assessment for possible hyperparathyroidism.  Her blood pressure today is controlled on losartan 100 mg, and HCTZ 25 mg daily.  She is not having any signs of edema.  I have suggested she reduce her HCTZ to 12.5 mg particular with her blood pressure at 106 and 102 as recorded in today's office visit.  To further evaluate the potential cryptogenic stroke, as requested by Dr. Leta Baptist I will schedule her for TEE to be done by Dr. Sallyanne Kuster  for further evaluation.  If the TEE is negative, she will be scheduled for a loop recorder to allow for long-term follow-up for potential arrhythmic etiology.  She is morbidly obese.  Weight loss is necessary.  She is scheduled to undergo a sleep evaluation in neurology.  Prior to her stroke she  had been going to the gym at least 3 days per week and I encouraged increased activity.  With her subclinical atherosclerosis noted on her MRA target LDL is less than 70.  I will see her in 1- 2 months for follow-up evaluation.   Medication Adjustments/Labs and Tests Ordered: Current medicines are reviewed at length with the patient today.  Concerns regarding medicines are outlined above.  Medication changes, Labs and Tests ordered today are listed in the Patient Instructions below. Patient Instructions  Medication Instructions:  DECREASE HCTZ to 12.5 mg (1/2 tablet) daily  Testing/Procedures: Your physician has requested that you have a TEE (November 6th with Dr. Sallyanne Kuster). During a TEE, sound waves are used to create images of your heart. It provides your doctor with information about the size and shape of your heart and how well your heart's chambers and valves are working. In this test, a transducer is attached to the end of a flexible tube that's guided down your throat and into your esophagus (the tube leading from you mouth to your stomach) to get a more detailed image of your heart. You are not awake for the procedure. Please see the instruction sheet given to you today. For further information please visit [email or web address].   Follow-Up: Your physician recommends that you schedule a follow-up appointment in: End of November/beginning of December with Dr. Claiborne Billings.   Any Other Special Instructions Will Be Listed Below (If Applicable).     If you need a refill on your cardiac medications before your next appointment, please call your pharmacy.      Signed, Shelva Majestic, MD  09/21/2017 7:19 PM    [facility] [address],  [place], [place]  [address] Phone: [phone]

## 2017-09-24 ENCOUNTER — Ambulatory Visit: Payer: Medicare Other

## 2017-09-26 DIAGNOSIS — I639 Cerebral infarction, unspecified: Secondary | ICD-10-CM | POA: Diagnosis not present

## 2017-09-27 ENCOUNTER — Ambulatory Visit
Admission: RE | Admit: 2017-09-27 | Discharge: 2017-09-27 | Disposition: A | Discharge status: Home / Self Care | Payer: Medicare Other | Source: Ambulatory Visit | Attending: Diagnostic Neuroimaging | Admitting: Diagnostic Neuroimaging

## 2017-09-27 DIAGNOSIS — I6521 Occlusion and stenosis of right carotid artery: Secondary | ICD-10-CM | POA: Diagnosis not present

## 2017-09-27 DIAGNOSIS — I639 Cerebral infarction, unspecified: Secondary | ICD-10-CM

## 2017-09-27 MED ORDER — GADOBENATE DIMEGLUMINE 529 MG/ML IV SOLN
20.0000 mL | Freq: Once | INTRAVENOUS | Status: AC | PRN
  Administered 2017-09-27: 20 mL via INTRAVENOUS

## 2017-10-02 ENCOUNTER — Ambulatory Visit (INDEPENDENT_AMBULATORY_CARE_PROVIDER_SITE_OTHER): Payer: Medicare Other | Admitting: Neurology

## 2017-10-02 ENCOUNTER — Encounter: Payer: Self-pay | Admitting: Neurology

## 2017-10-02 VITALS — BP 114/81 | HR 69 | Ht 66.0 in | Wt 306.0 lb

## 2017-10-02 DIAGNOSIS — G4719 Other hypersomnia: Secondary | ICD-10-CM | POA: Diagnosis not present

## 2017-10-02 DIAGNOSIS — R0681 Apnea, not elsewhere classified: Secondary | ICD-10-CM

## 2017-10-02 DIAGNOSIS — Z6841 Body Mass Index (BMI) 40.0 and over, adult: Secondary | ICD-10-CM | POA: Diagnosis not present

## 2017-10-02 DIAGNOSIS — I639 Cerebral infarction, unspecified: Secondary | ICD-10-CM

## 2017-10-02 DIAGNOSIS — R351 Nocturia: Secondary | ICD-10-CM | POA: Diagnosis not present

## 2017-10-02 DIAGNOSIS — R0683 Snoring: Secondary | ICD-10-CM

## 2017-10-02 NOTE — Patient Instructions (Signed)

## 2017-10-02 NOTE — Progress Notes (Signed)
Subjective:    Patient ID: Kathleen [name] is a 56 y.o. female.  HPI     Star Age, MD, PhD [facility] [address].O. [place], [place] [address]   Dear [name],   I saw your patient, Lauri [name], upon your kind request in my clinic today for initial consultation of her sleep disorder, in particular, concern for underlying obstructive sleep apnea. The patient is unaccompanied today. As you know, [name] is a 56 year old right-handed woman with an underlying medical history of HTN, cerebellar stroke in June 2018, hyperlipidemia, vitamin D deficiency and morbid obesity with a BMI of over 45, who reports snoring and excessive daytime somnolence. I reviewed your office note from 08/13/2017. Her Epworth sleepiness score is 4 out of 24 today, fatigue score is 56 out of 63. She had a neck MRA on 09/29/17, we talked about the results, she is scheduled for TEE on 10/08/17. She is anxious about it. She has bilateral knee pain. She quit smoking after the stroke but admits that she smokes about 1 per week currently. She does have a history of drinking wine every day but reports that she does not drink daily currently. She does not drink caffeine daily. She lives with her husband. She has one son and one biological grandchild, altogether 66 grandchildren with her husband's grandchildren. She watches TV in bed and turns it up before falling asleep. She is usually in bed by 7:30 or 8, wakeup time is around 5 or 5:30. She has nocturia about twice per average night and denies morning headaches. Her husband sleeps in a separate bedroom. She has been told that she stops breathing in her sleep however. She's not aware of any family history of OSA.  Her Past Medical History Is Significant For: Past Medical History:  Diagnosis Date  . Anxiety   . Hypertension    NO MED MD TO GIVE RX  . Obesity     Her Past Surgical History Is Significant For: Past Surgical  History:  Procedure Laterality Date  . CARPAL TUNNEL RELEASE  01/12/2012   Procedure: CARPAL TUNNEL RELEASE;  Surgeon: Schuyler Amor, MD;  Location: [facility];  Service: Orthopedics;  Laterality: Right;  . CESAREAN SECTION    . NOSE SURGERY     BROKEN   MANY YRS AGO   . TUBAL LIGATION    . WRIST FRACTURE SURGERY  Feb 2013    Her Family History Is Significant For: Family History  Problem Relation Age of Onset  . Hypertension Father   . Diabetes Father     Her Social History Is Significant For: Social History   Social History  . Marital status: Widowed    Spouse name: N/A  . Number of children: N/A  . Years of education: N/A   Social History Main Topics  . Smoking status: Former Smoker    Quit date: 05/17/2017  . Smokeless tobacco: Never Used     Comment: Smoking about 6 cigs/week  . Alcohol use 12.6 oz/week    21 Glasses of wine per week     Comment: DAILY ALCOHOL and WINE  . Drug use: No  . Sexual activity: Yes    Birth control/ protection: None   Other Topics Concern  . None   Social History Narrative   Lives home with S.O. Vivien Presto.  Education level 12th grade.    Drinks coffee.  Has one child.     Her Allergies Are:  Allergies  Allergen Reactions  . Hydrocodone Itching  . Lisinopril Cough  . Tramadol Itching  :   Her Current Medications Are:  Outpatient Encounter Prescriptions as of 10/02/2017  Medication Sig  . albuterol (PROVENTIL HFA;VENTOLIN HFA) 108 (90 BASE) MCG/ACT inhaler Inhale 2 puffs into the lungs every 4 (four) hours as needed for wheezing or shortness of breath.  . ALPRAZolam (XANAX) 0.5 MG tablet Take 1 tablet (0.5 mg total) by mouth as needed for anxiety (for sedation before MRI scan; take 1 hour before scan; may repeat 15 min before scan).  Marland Kitchen aspirin 325 MG tablet Take 1 tablet (325 mg total) by mouth daily.  Marland Kitchen atorvastatin (LIPITOR) 40 MG tablet Take 1 tablet (40 mg total) by mouth daily at 6 PM.  . hydrochlorothiazide  (HYDRODIURIL) 25 MG tablet Take 0.5 tablets (12.5 mg total) by mouth daily.  Marland Kitchen losartan (COZAAR) 100 MG tablet Take 1 tablet (100 mg total) by mouth daily.  . meloxicam (MOBIC) 7.5 MG tablet Take 1 tablet (7.5 mg total) by mouth daily.  . traZODone (DESYREL) 50 MG tablet Take 0.5-1 tablets (25-50 mg total) by mouth at bedtime as needed for sleep.  . Vitamin D, Ergocalciferol, (DRISDOL) 50000 units CAPS capsule Take 50,000 Units by mouth once a week.    No facility-administered encounter medications on file as of 10/02/2017.   :  Review of Systems:  Out of a complete 14 point review of systems, all are reviewed and negative with the exception of these symptoms as listed below: Review of Systems  Neurological:       Patient reports daytime sleepiness and fatigue, and snoring.   Epworth Sleepiness Scale 0= would never doze 1= slight chance of dozing 2= moderate chance of dozing 3= high chance of dozing  Sitting and reading: 2 Watching TV: 1 Sitting inactive in a public place (ex. Theater or meeting): 0 As a passenger in a car for an hour without a break: 1 Lying down to rest in the afternoon: 0 Sitting and talking to someone: 0 Sitting quietly after lunch (no alcohol): 0 In a car, while stopped in traffic: 0 Total: 4    Objective:  Neurological Exam  Physical Exam Physical Examination:   Vitals:   10/02/17 1308  BP: 114/81  Pulse: 69    General Examination: The patient is a very pleasant 56 y.o. female in no acute distress. She appears well-developed and well-nourished and well groomed.   HEENT: Normocephalic, atraumatic, pupils are equal, round and reactive to light and accommodation. Extraocular tracking is good without limitation to gaze excursion or nystagmus noted. Normal smooth pursuit is noted. Hearing is grossly intact. Face is symmetric with normal facial animation and normal facial sensation. Speech is clear with no dysarthria noted. There is no hypophonia. There  is no lip, neck/head, jaw or voice tremor. Neck is supple with full range of passive and active motion. There are no carotid bruits on auscultation. Oropharynx exam reveals: mild mouth dryness, adequate dental hygiene with full dentures and moderate airway crowding, due to larger tonsils about 3+. Mallampati is class II. Tongue protrudes centrally and palate elevates symmetrically. Neck size is 16.75 inches.   Chest: Clear to auscultation without wheezing, rhonchi or crackles noted.  Heart: S1+S2+0, regular and normal without murmurs, rubs or gallops noted.   Abdomen: Soft, non-tender and non-distended with normal bowel sounds appreciated on auscultation.  Extremities: There is no pitting edema in the distal lower extremities bilaterally. Pedal pulses are intact.  Skin: Warm and dry without trophic changes noted.  Musculoskeletal: exam reveals bilateral knee pain, right more than left.    Neurologically:  Mental status: The patient is awake, alert and oriented in all 4 spheres. Her immediate and remote memory, attention, language skills and fund of knowledge are appropriate. There is no evidence of aphasia, agnosia, apraxia or anomia. Speech is clear with normal prosody and enunciation. Thought process is linear. Mood is normal and affect is normal.  Cranial nerves II - XII are as described above under HEENT exam. In addition: shoulder shrug is normal with equal shoulder height noted. Motor exam: Normal bulk, strength and tone is noted. There is no drift, tremor or rebound. Romberg is not tested for safety, reflexes are 1+ in the upper extremities, knees or not tested due to pain reported. Fine motor skills are grossly intact. She has no intention tremor, heel-to-shin is not possible for her due to knee pain.  Cerebellar testing: No dysmetria or intention tremor  in the upper extremities. There is no truncal or gait ataxia.  Sensory exam: intact to light touch in the upper and lower extremities.   Gait, station and balance: She stands with difficulty due to knee pain. She has a limp on the right. She has no obvious cerebellar ataxia otherwise. Tandem walk is not tested due to fall risk.  Assessment and Plan:   In summary, REYLENE STAUDER is a very pleasant 56 y.o.-year old female with an underlying medical history of HTN, cerebellar stroke in June 2018, hyperlipidemia, vitamin D deficiency and morbid obesity with a BMI of over 45, whose history and physical exam are concerning for obstructive sleep apnea (OSA). I had a long chat with the patient about my findings and the diagnosis of OSA, its prognosis and treatment options. We talked about medical treatments, surgical interventions and non-pharmacological approaches. I explained in particular the risks and ramifications of untreated moderate to severe OSA, especially with respect to developing cardiovascular disease down the Road, including congestive heart failure, difficult to treat hypertension, cardiac arrhythmias, or stroke. Even type 2 diabetes has, in part, been linked to untreated OSA. Symptoms of untreated OSA include daytime sleepiness, memory problems, mood irritability and mood disorder such as depression and anxiety, lack of energy, as well as recurrent headaches, especially morning headaches. We talked about smoking cessation and trying to maintain a healthy lifestyle in general, as well as the importance of weight control. I encouraged the patient to eat healthy, exercise daily and keep well hydrated, to keep a scheduled bedtime and wake time routine, to not skip any meals and eat healthy snacks in between meals. I advised the patient not to drive when feeling sleepy.  She was advised about her recent neck MRA results. She is scheduled for a TEE next week.  I recommended the following at this time: sleep study with potential positive airway pressure titration. (We will score hypopneas at 4%).   I explained the sleep test procedure  to the patient and also outlined possible surgical and non-surgical treatment options of OSA, including the use of a custom-made dental device (which would require a referral to a specialist dentist or oral surgeon), upper airway surgical options, such as pillar implants, radiofrequency surgery, tongue base surgery, and UPPP (which would involve a referral to an ENT surgeon). Rarely, jaw surgery such as mandibular advancement may be considered.  I also explained the CPAP treatment option to the patient, who indicated that she would be willing to try  CPAP if the need arises. I explained the importance of being compliant with PAP treatment, not only for insurance purposes but primarily to improve Her symptoms, and for the patient's long term health benefit, including to reduce Her cardiovascular risks. I answered all her questions today and the patient was in agreement. I would like to see her back after the sleep study is completed and encouraged her to call with any interim questions, concerns, problems or updates.   Thank you very much for allowing me to participate in the care of this nice patient. If I can be of any further assistance to you please do not hesitate to talk to me.  Sincerely,   Star Age, MD, PhD

## 2017-10-03 ENCOUNTER — Ambulatory Visit: Payer: Medicare Other | Admitting: Family Medicine

## 2017-10-03 ENCOUNTER — Telehealth: Payer: Self-pay | Admitting: Cardiovascular Disease

## 2017-10-03 NOTE — Telephone Encounter (Signed)
Pt.notified

## 2017-10-03 NOTE — Telephone Encounter (Signed)
She will be sedated at time of TEE; Dr Evette Georges pt Kathleen [name]

## 2017-10-03 NOTE — Telephone Encounter (Signed)
New message    Patient calling to request Xanax prior to procedure on 11/6. States she is having anxiety about procedure. Please call.

## 2017-10-04 ENCOUNTER — Ambulatory Visit: Payer: Medicare Other | Admitting: Family Medicine

## 2017-10-08 ENCOUNTER — Encounter (HOSPITAL_COMMUNITY)
Admission: RE | Disposition: A | Discharge status: Home / Self Care | Payer: Self-pay | Source: Ambulatory Visit | Attending: Cardiovascular Disease

## 2017-10-08 ENCOUNTER — Ambulatory Visit (HOSPITAL_COMMUNITY)
Admission: RE | Admit: 2017-10-08 | Discharge: 2017-10-08 | Disposition: A | Discharge status: Home / Self Care | Payer: Medicare Other | Source: Ambulatory Visit | Attending: Cardiovascular Disease | Admitting: Cardiovascular Disease

## 2017-10-08 ENCOUNTER — Ambulatory Visit (HOSPITAL_COMMUNITY): Payer: Medicare Other | Admitting: Certified Registered Nurse Anesthetist

## 2017-10-08 ENCOUNTER — Ambulatory Visit (HOSPITAL_COMMUNITY): Admit: 2017-10-08 | Payer: Medicare Other | Admitting: Internal Medicine

## 2017-10-08 ENCOUNTER — Ambulatory Visit (HOSPITAL_BASED_OUTPATIENT_CLINIC_OR_DEPARTMENT_OTHER)
Admission: RE | Admit: 2017-10-08 | Discharge: 2017-10-08 | Disposition: A | Discharge status: Home / Self Care | Payer: Medicare Other | Source: Ambulatory Visit | Attending: Cardiovascular Disease | Admitting: Cardiovascular Disease

## 2017-10-08 ENCOUNTER — Encounter (HOSPITAL_COMMUNITY): Payer: Self-pay

## 2017-10-08 DIAGNOSIS — Z6841 Body Mass Index (BMI) 40.0 and over, adult: Secondary | ICD-10-CM | POA: Diagnosis not present

## 2017-10-08 DIAGNOSIS — F419 Anxiety disorder, unspecified: Secondary | ICD-10-CM | POA: Diagnosis not present

## 2017-10-08 DIAGNOSIS — I639 Cerebral infarction, unspecified: Secondary | ICD-10-CM | POA: Diagnosis not present

## 2017-10-08 DIAGNOSIS — J45909 Unspecified asthma, uncomplicated: Secondary | ICD-10-CM | POA: Insufficient documentation

## 2017-10-08 DIAGNOSIS — M1990 Unspecified osteoarthritis, unspecified site: Secondary | ICD-10-CM | POA: Insufficient documentation

## 2017-10-08 DIAGNOSIS — I6389 Other cerebral infarction: Secondary | ICD-10-CM | POA: Diagnosis not present

## 2017-10-08 DIAGNOSIS — I63442 Cerebral infarction due to embolism of left cerebellar artery: Secondary | ICD-10-CM | POA: Diagnosis not present

## 2017-10-08 DIAGNOSIS — Z7982 Long term (current) use of aspirin: Secondary | ICD-10-CM | POA: Diagnosis not present

## 2017-10-08 DIAGNOSIS — I10 Essential (primary) hypertension: Secondary | ICD-10-CM | POA: Diagnosis not present

## 2017-10-08 DIAGNOSIS — Z87891 Personal history of nicotine dependence: Secondary | ICD-10-CM | POA: Diagnosis not present

## 2017-10-08 DIAGNOSIS — E785 Hyperlipidemia, unspecified: Secondary | ICD-10-CM | POA: Insufficient documentation

## 2017-10-08 HISTORY — PX: LOOP RECORDER INSERTION: EP1214

## 2017-10-08 HISTORY — PX: TEE WITHOUT CARDIOVERSION: SHX5443

## 2017-10-08 HISTORY — DX: Unspecified asthma, uncomplicated: J45.909

## 2017-10-08 HISTORY — DX: Cerebral infarction, unspecified: I63.9

## 2017-10-08 SURGERY — ECHOCARDIOGRAM, TRANSESOPHAGEAL
Anesthesia: Monitor Anesthesia Care

## 2017-10-08 SURGERY — LOOP RECORDER INSERTION

## 2017-10-08 MED ORDER — ONDANSETRON HCL 4 MG/2ML IJ SOLN
INTRAMUSCULAR | Status: DC | PRN
  Administered 2017-10-08: 4 mg via INTRAVENOUS

## 2017-10-08 MED ORDER — LIDOCAINE-EPINEPHRINE 1 %-1:100000 IJ SOLN
INTRAMUSCULAR | Status: DC | PRN
  Administered 2017-10-08: 10 mL

## 2017-10-08 MED ORDER — PROPOFOL 500 MG/50ML IV EMUL
INTRAVENOUS | Status: DC | PRN
  Administered 2017-10-08: 120 ug/kg/min via INTRAVENOUS

## 2017-10-08 MED ORDER — LIDOCAINE-EPINEPHRINE 1 %-1:100000 IJ SOLN
INTRAMUSCULAR | Status: AC
Start: 2017-10-08 — End: ?
  Filled 2017-10-08: qty 1

## 2017-10-08 MED ORDER — SODIUM CHLORIDE 0.9 % IV SOLN
INTRAVENOUS | Status: DC
  Administered 2017-10-08: 10:00:00 via INTRAVENOUS

## 2017-10-08 MED ORDER — LIDOCAINE HCL (CARDIAC) 20 MG/ML IV SOLN
INTRAVENOUS | Status: DC | PRN
  Administered 2017-10-08: 80 mg via INTRAVENOUS

## 2017-10-08 MED ORDER — BUTAMBEN-TETRACAINE-BENZOCAINE 2-2-14 % EX AERO
INHALATION_SPRAY | CUTANEOUS | Status: DC | PRN
  Administered 2017-10-08: 2 via TOPICAL

## 2017-10-08 MED ORDER — PROPOFOL 10 MG/ML IV BOLUS
INTRAVENOUS | Status: DC | PRN
  Administered 2017-10-08: 15 mg via INTRAVENOUS
  Administered 2017-10-08: 20 mg via INTRAVENOUS

## 2017-10-08 SURGICAL SUPPLY — 2 items
LOOP REVEAL LINQSYS (Prosthesis & Implant Heart) ×1 IMPLANT
PACK LOOP INSERTION (CUSTOM PROCEDURE TRAY) ×2 IMPLANT

## 2017-10-08 NOTE — Anesthesia Preprocedure Evaluation (Addendum)
Anesthesia Evaluation  Patient identified by MRN, date of birth, ID band Patient awake    Reviewed: NPO status , Patient's Chart, lab work & pertinent test results  Airway Mallampati: II  TM Distance: <3 FB Neck ROM: Full    Dental  (+) Edentulous Upper, Edentulous Lower   Pulmonary asthma , sleep apnea , former smoker,    breath sounds clear to auscultation       Cardiovascular hypertension,  Rhythm:Regular Rate:Normal     Neuro/Psych    GI/Hepatic Neg liver ROS,   Endo/Other  negative endocrine ROSMorbid obesity  Renal/GU negative Renal ROS     Musculoskeletal  (+) Arthritis ,   Abdominal   Peds  Hematology   Anesthesia Other Findings   Reproductive/Obstetrics                            Anesthesia Physical Anesthesia Plan  ASA: III  Anesthesia Plan: MAC   Post-op Pain Management:    Induction: Intravenous  PONV Risk Score and Plan: 3 and Treatment may vary due to age or medical condition and Ondansetron  Airway Management Planned: Natural Airway  Additional Equipment:   Intra-op Plan:   Post-operative Plan:   Informed Consent: I have reviewed the patients History and Physical, chart, labs and discussed the procedure including the risks, benefits and alternatives for the proposed anesthesia with the patient or authorized representative who has indicated his/her understanding and acceptance.     Plan Discussed with: CRNA  Anesthesia Plan Comments:         Anesthesia Quick Evaluation

## 2017-10-08 NOTE — Progress Notes (Signed)
Cath lab team at bedside with Dr. Rayann Heman.  Loop recorder placed.

## 2017-10-08 NOTE — Interval H&P Note (Signed)
History and Physical Interval Note:  10/08/2017 9:32 AM  [name]  has presented today for surgery, with the diagnosis of MALIGNANT HYPERTENSION  The various methods of treatment have been discussed with the patient and family. After consideration of risks, benefits and other options for treatment, the patient has consented to  Procedure(s): TRANSESOPHAGEAL ECHOCARDIOGRAM (TEE) (N/A) as a surgical intervention .  The patient's history has been reviewed, patient examined, no change in status, stable for surgery.  I have reviewed the patient's chart and labs.  Questions were answered to the patient's satisfaction.     Barri Neidlinger

## 2017-10-08 NOTE — Discharge Instructions (Signed)
Implant site care: Keep incision clean and dry for 3 days. You can remove outer dressing tomorrow. Leave steri-strips (little pieces of tape) on until seen in the office for wound check appointment. Call the office [phone]) for redness, drainage, swelling, or fever.   TEE  YOU HAD AN CARDIAC PROCEDURE TODAY: Refer to the procedure report and other information in the discharge instructions given to you for any specific questions about what was found during the examination. If this information does not answer your questions, please call [facility] office at [phone] to clarify.   DIET: Your first meal following the procedure should be a light meal and then it is ok to progress to your normal diet. A half-sandwich or bowl of soup is an example of a good first meal. Heavy or fried foods are harder to digest and may make you feel nauseous or bloated. Drink plenty of fluids but you should avoid alcoholic beverages for 24 hours. If you had a esophageal dilation, please see attached instructions for diet.   ACTIVITY: Your care partner should take you home directly after the procedure. You should plan to take it easy, moving slowly for the rest of the day. You can resume normal activity the day after the procedure however YOU SHOULD NOT DRIVE, use power tools, machinery or perform tasks that involve climbing or major physical exertion for 24 hours (because of the sedation medicines used during the test).   SYMPTOMS TO REPORT IMMEDIATELY: A cardiologist can be reached at any hour. Please call [phone] for any of the following symptoms:  Vomiting of blood or coffee ground material  New, significant abdominal pain  New, significant chest pain or pain under the shoulder blades  Painful or persistently difficult swallowing  New shortness of breath  Black, tarry-looking or red, bloody stools  FOLLOW UP:  Please also call with any specific questions about appointments or follow up  tests.   Implantable Loop Recorder Placement, Care After Refer to this sheet in the next few weeks. These instructions provide you with information about caring for yourself after your procedure. Your health care provider may also give you more specific instructions. Your treatment has been planned according to current medical practices, but problems sometimes occur. Call your health care provider if you have any problems or questions after your procedure. What can I expect after the procedure? After the procedure, it is common to have:  Soreness or pain near the cut from surgery (incision).  Some swelling or bruising near the incision.  Follow these instructions at home: Medicines  Take over-the-counter and prescription medicines only as told by your health care provider.  If you were prescribed an antibiotic medicine, take it as told by your health care provider. Do not stop taking the antibiotic even if you start to feel better. Bathing  Do not take baths, swim, or use a hot tub until your health care provider approves. Ask your health care provider if you can take showers. You may only be allowed to take sponge baths for bathing. Incision care  Follow instructions from your health care provider about how to take care of your incision. Make sure you: ? Wash your hands with soap and water before you change your bandage (dressing). If soap and water are not available, use hand sanitizer. ? Change your dressing as told by your health care provider. ? Keep your dressing dry. ? Leave stitches (sutures), skin glue, or adhesive strips in place. These skin closures may need to  stay in place for 2 weeks or longer. If adhesive strip edges start to loosen and curl up, you may trim the loose edges. Do not remove adhesive strips completely unless your health care provider tells you to do that.  Check your incision area every day for signs of infection. Check for: ? More redness, swelling, or  pain. ? Fluid or blood. ? Warmth. ? Pus or a bad smell. Driving  If you received a sedative, do not drive for 24 hours after the procedure.  If you did not receive a sedative, ask your health care provider when it is safe to drive. Activity  Return to your normal activities as told by your health care provider. Ask your health care provider what activities are safe for you.  Until your health care provider says it is safe: ? Do not lift anything that is heavier than 10 lb (4.5 kg). ? Do not do activities that involve lifting your arms over your head. General instructions   Follow instructions from your health care provider about how and when to use your implantable loop recorder.  Do not go through a metal detection gate, and do not let someone hold a metal detector over your chest. Show your ID card.  Do not have an MRI unless you check with your health care provider first.  Do not use any tobacco products, such as cigarettes, chewing tobacco, and e-cigarettes. Tobacco can delay healing. If you need help quitting, ask your health care provider.  Keep all follow-up visits as told by your health care provider. This is important. Contact a health care provider if:  You have more redness, swelling, or pain around your incision.  You have more fluid or blood coming from your incision.  Your incision feels warm to the touch.  You have pus or a bad smell coming from your incision.  You have a fever.  You have pain that is not relieved by your pain medicine.  You have triggered your device because of fainting (syncope) or because of a heartbeat that feels like it is racing, slow, fluttering, or skipping (palpitations). Get help right away if:  You have chest pain.  You have difficulty breathing. This information is not intended to replace advice given to you by your health care provider. Make sure you discuss any questions you have with your health care provider. Document  Released: 10/31/2015 Document Revised: 04/26/2016 Document Reviewed: 08/24/2015 Elsevier Interactive Patient Education  [employer].

## 2017-10-08 NOTE — Anesthesia Procedure Notes (Signed)
Procedure Name: MAC Date/Time: 10/08/2017 10:00 AM Performed by: White, Amedeo Plenty, CRNA Pre-anesthesia Checklist: Patient identified, Emergency Drugs available, Suction available and Patient being monitored Patient Re-evaluated:Patient Re-evaluated prior to induction Oxygen Delivery Method: Simple face mask

## 2017-10-08 NOTE — Op Note (Signed)
INDICATIONS: stroke  PROCEDURE:   Informed consent was obtained prior to the procedure. The risks, benefits and alternatives for the procedure were discussed and the patient comprehended these risks.  Risks include, but are not limited to, cough, sore throat, vomiting, nausea, somnolence, esophageal and stomach trauma or perforation, bleeding, low blood pressure, aspiration, pneumonia, infection, trauma to the teeth and death.    After a procedural time-out, the oropharynx was anesthetized with 20% benzocaine spray.   IV propofol was administered by anesthesiology (Dr. Orene Desanctis) for sedation.  The transesophageal probe was inserted in the esophagus and stomach without difficulty and multiple views were obtained.  The patient was kept under observation until the patient left the procedure room.  The patient left the procedure room in stable condition.   Agitated microbubble saline contrast was administered.  COMPLICATIONS:    There were no immediate complications.  FINDINGS:  Dilated left atrium with "smoke", but without thrombus. Normal appendage emptying velocities. Normal LVEF and normal valves. 2+ TR. No significant aortic atherosclerosis.  RECOMMENDATIONS:     Proceed with implantable loop recorder.  Time Spent Directly with the Patient:  30 minutes   Katrine Radich 10/08/2017, 10:19 AM

## 2017-10-08 NOTE — Anesthesia Postprocedure Evaluation (Signed)
Anesthesia Post Note  Patient: Kathleen [name]  Procedure(s) Performed: TRANSESOPHAGEAL ECHOCARDIOGRAM (TEE) (N/A )     Patient location during evaluation: Endoscopy Anesthesia Type: MAC Level of consciousness: awake and alert Pain management: pain level controlled Vital Signs Assessment: post-procedure vital signs reviewed and stable Respiratory status: spontaneous breathing, nonlabored ventilation, respiratory function stable and patient connected to nasal cannula oxygen Cardiovascular status: stable and blood pressure returned to baseline Postop Assessment: no apparent nausea or vomiting Anesthetic complications: no    Last Vitals:  Vitals:   10/08/17 1130 10/08/17 1140  BP: (!) 143/103 128/88  Pulse: (!) 59 62  Resp: 15 20  Temp:    SpO2: 99% 100%    Last Pain:  Vitals:   10/08/17 1022  TempSrc: Oral                 Aerionna Moravek,JAMES TERRILL

## 2017-10-08 NOTE — Transfer of Care (Signed)
Immediate Anesthesia Transfer of Care Note  Patient: Kathleen [name]  Procedure(s) Performed: TRANSESOPHAGEAL ECHOCARDIOGRAM (TEE) (N/A )  Patient Location: Endoscopy Unit  Anesthesia Type:MAC  Level of Consciousness: awake, alert , oriented and patient cooperative  Airway & Oxygen Therapy: Patient Spontanous Breathing and Patient connected to nasal cannula oxygen  Post-op Assessment: Report given to RN and Post -op Vital signs reviewed and stable  Post vital signs: Reviewed and stable  Last Vitals:  Vitals:   10/08/17 0913  BP: (!) 153/85  Pulse: 67  Resp: (!) 26  Temp: 36.8 C  SpO2: 99%    Last Pain:  Vitals:   10/08/17 0913  TempSrc: Oral         Complications: No apparent anesthesia complications

## 2017-10-08 NOTE — Consult Note (Addendum)
ELECTROPHYSIOLOGY CONSULT NOTE  Patient ID: Kathleen [name] MRN: [id number], DOB/AGE: [birth year]   Admit date: 10/08/2017 Date of Consult: 10/08/2017  Primary Physician: Alfonse Spruce, FNP Primary Cardiologist: Dr. Claiborne Billings Reason for Consultation: Cryptogenic stroke ; recommendations regarding Implantable Loop Recorder  History of Present Illness: Kathleen [name] was referred out patient to Dr. Claiborne Billings by her neurologist Dr. Elgie Congo for evaluation for TEE and loop implant given cryptogenic stroke in June of this year.  PMHx includes HTN, morbid obesity, HLD, + smoker, suspect sleep apnea pending evaluation.  She comes to [facility] today out patient for TEE and loop evaluation/possible implant.   05/18/17: TTE Study Conclusions - Left ventricle: The cavity size was normal. Wall thickness was   increased in a pattern of moderate LVH. Systolic function was   vigorous. The estimated ejection fraction was in the range of 65%   to 70%. Wall motion was normal; there were no regional wall   motion abnormalities. Doppler parameters are consistent with   abnormal left ventricular relaxation (grade 1 diastolic   dysfunction). Doppler parameters are consistent with high   ventricular filling pressure. - Aortic valve: Valve area (VTI): 3.08 cm^2. Valve area (Vmax):   2.45 cm^2. Valve area (Vmean): 2.44 cm^2. - Atrial septum: No defect or patent foramen ovale was identified.  05/18/17: Carotid US Summary: Bilateral: mild mixed plaque origin ICA. 1-39% ICA plaquing. Vertebral artery flow is antegrade.  06/28/17: LE venous US: Summary - Technically difficult due to body habitus. - No evidence of deep vein or superficial thrombosis involving the right lower extremity and left common femoral vein. - No evidence of Baker&'s cyst on the right.   The patient denies any history of cardiac awareness, no chest pain, shortness of breath, dizziness, palpitations, or syncope.    EP has been asked  to evaluate for placement of an implantable loop recorder to monitor for atrial fibrillation.     Past Medical History:  Diagnosis Date  . Anxiety   . Asthma   . Hypertension    NO MED MD TO GIVE RX  . Obesity   . Stroke [facility]) 05/2017     Surgical History:  Past Surgical History:  Procedure Laterality Date  . CESAREAN SECTION    . NOSE SURGERY     BROKEN   MANY YRS AGO   . TUBAL LIGATION    . WRIST FRACTURE SURGERY  Feb 2013     Medications Prior to Admission  Medication Sig Dispense Refill Last Dose  . albuterol (PROVENTIL HFA;VENTOLIN HFA) 108 (90 BASE) MCG/ACT inhaler Inhale 2 puffs into the lungs every 4 (four) hours as needed for wheezing or shortness of breath. 1 Inhaler 0 Past Week at Unknown time  . aspirin 325 MG tablet Take 1 tablet (325 mg total) by mouth daily. 90 tablet 3 10/07/2017 at Unknown time  . atorvastatin (LIPITOR) 40 MG tablet Take 1 tablet (40 mg total) by mouth daily at 6 PM. 30 tablet 0 10/07/2017 at Unknown time  . cyclobenzaprine (FLEXERIL) 10 MG tablet Take 10 mg by mouth at bedtime as needed for muscle spasms.   10/07/2017 at Unknown time  . hydrochlorothiazide (HYDRODIURIL) 25 MG tablet Take 0.5 tablets (12.5 mg total) by mouth daily. 30 tablet 0 10/07/2017 at Unknown time  . losartan (COZAAR) 100 MG tablet Take 1 tablet (100 mg total) by mouth daily. 30 tablet 0 10/07/2017 at Unknown time  . meloxicam (MOBIC) 7.5 MG tablet Take 1 tablet (7.5 mg  total) by mouth daily. (Patient taking differently: Take 7.5 mg by mouth daily as needed for pain. ) 30 tablet 2 Past Week at Unknown time  . naproxen sodium (ALEVE) 220 MG tablet Take 440-660 mg by mouth daily as needed (pain).   Past Week at Unknown time  . traZODone (DESYREL) 50 MG tablet Take 0.5-1 tablets (25-50 mg total) by mouth at bedtime as needed for sleep. 30 tablet 0 10/07/2017 at Unknown time  . Vitamin D, Ergocalciferol, (DRISDOL) 50000 units CAPS capsule Take 50,000 Units by mouth once a week.   2  Past Week at Unknown time  . ALPRAZolam (XANAX) 0.5 MG tablet Take 1 tablet (0.5 mg total) by mouth as needed for anxiety (for sedation before MRI scan; take 1 hour before scan; may repeat 15 min before scan). (Patient not taking: Reported on 10/03/2017) 3 tablet 0 Completed Course at Unknown time    Inpatient Medications:   Allergies:  Allergies  Allergen Reactions  . Hydrocodone Itching  . Lisinopril Cough  . Tramadol Itching    Social History   Socioeconomic History  . Marital status: Widowed    Spouse name: Not on file  . Number of children: Not on file  . Years of education: Not on file  . Highest education level: Not on file  Social Needs  . Financial resource strain: Not on file  . Food insecurity - worry: Not on file  . Food insecurity - inability: Not on file  . Transportation needs - medical: Not on file  . Transportation needs - non-medical: Not on file  Occupational History  . Not on file  Tobacco Use  . Smoking status: Former Smoker    Last attempt to quit: 05/17/2017    Years since quitting: 0.3  . Smokeless tobacco: Never Used  . Tobacco comment: Smoking about 6 cigs/week  Substance and Sexual Activity  . Alcohol use: Yes    Alcohol/week: 12.6 oz    Types: 21 Glasses of wine per week    Comment: DAILY ALCOHOL and WINE  . Drug use: No  . Sexual activity: Yes    Birth control/protection: None  Other Topics Concern  . Not on file  Social History Narrative   Lives home with S.O. Vivien Presto.  Education level 12th grade.    Drinks coffee.  Has one child.      Family History  Problem Relation Age of Onset  . Hypertension Father   . Diabetes Father       Review of Systems: All other systems reviewed and are otherwise negative except as noted above. She gets emotional discussing fears of another stroke  Physical Exam: Vitals:   10/08/17 0913  BP: (!) 153/85  Pulse: 67  Resp: (!) 26  Temp: 98.2 F (36.8 C)  TempSrc: Oral  SpO2: 99%    Weight: (!) 306 lb (138.8 kg)  Height: 5\' 6"  (1.676 m)    GEN- The patient is well appearing, alert and oriented x 3 today.   Head- normocephalic, atraumatic Eyes-  Sclera clear, conjunctiva pink Ears- hearing intact Oropharynx- clear Neck- supple Lungs- CTA b/l, normal work of breathing Heart- RRR,  no murmurs, rubs or gallops  GI- soft, NT, ND, obese Extremities- no clubbing, cyanosis, or edema MS- no significant deformity or atrophy Skin- no rash or lesion Psych- euthymic mood, full affect   Labs:   Lab Results  Component Value Date   WBC 8.8 06/19/2017   HGB 12.3 06/19/2017   HCT  38.5 06/19/2017   MCV 84 06/19/2017   PLT 228 06/19/2017   No results for input(s): NA, K, CL, CO2, BUN, CREATININE, CALCIUM, PROT, BILITOT, ALKPHOS, ALT, AST, GLUCOSE in the last 168 hours.  Invalid input(s): LABALBU Lab Results  Component Value Date   CKTOTAL 69 05/17/2017   TROPONINI <0.03 05/18/2017   Lab Results  Component Value Date   CHOL 141 06/19/2017   CHOL 176 05/18/2017   CHOL 175 07/29/2015   Lab Results  Component Value Date   HDL 58 06/19/2017   HDL 53 05/18/2017   HDL 55 07/29/2015   Lab Results  Component Value Date   LDLCALC 70 06/19/2017   LDLCALC 107 (H) 05/18/2017   LDLCALC 102 07/29/2015   Lab Results  Component Value Date   TRIG 66 06/19/2017   TRIG 82 05/18/2017   TRIG 90 07/29/2015   Lab Results  Component Value Date   CHOLHDL 2.4 06/19/2017   CHOLHDL 3.3 05/18/2017   CHOLHDL 3.2 07/29/2015   No results found for: LDLDIRECT  No results found for: DDIMER   Radiology/Studies: none  12-lead ECG SR All prior EKG's in EPIC reviewed with no documented atrial fibrillation  Telemetry not on telemetry, RN reports SR on arrival CV strips reviewed, no AFib/arrhythmias, nocturnal/early AM brady/pause/AV block episodes (longest 6 seconds)  30 day event monitor (05/23/17) Sinus rhythm Occasional nocturnal sinus bradycardia is observed No atrial  fibrillation is observed though baseline artifact limits interpretation at times No AV block or pauses No sustained arrhythmias  Assessment and Plan:  1. Cryptogenic stroke The patient presents with cryptogenic stroke.  The patient has a TEE planned for this AM.  I spoke at length with the patient about implantable loop recorder.  Risks, benefits, and alteratives to implantable loop recorder were discussed with the patient today, she has already worn an EM without AF.   At this time, the patient is very clear in her decision to proceed with implantable loop recorder.   Wound care was reviewed with the patient (keep incision clean and dry for 3 days).  Wound check will be scheduled for the patient  Please call with questions.   Renee Dyane Dustman, PA-C 10/08/2017   I have seen, examined the patient, and reviewed the above assessment and plan.  Changes to above are made where necessary.  On exam, RRR.  TEE reveals no thrombus.  Risks and benefits of ILR were discussed at length with patient and spouse.  They are clear in their understanding of the procedure and wish to proceed.  Co Sign: Thompson Grayer, MD 10/08/2017 11:18 AM

## 2017-10-10 DIAGNOSIS — Z113 Encounter for screening for infections with a predominantly sexual mode of transmission: Secondary | ICD-10-CM | POA: Diagnosis not present

## 2017-10-10 DIAGNOSIS — Z01118 Encounter for examination of ears and hearing with other abnormal findings: Secondary | ICD-10-CM | POA: Diagnosis not present

## 2017-10-10 DIAGNOSIS — Z5181 Encounter for therapeutic drug level monitoring: Secondary | ICD-10-CM | POA: Diagnosis not present

## 2017-10-10 DIAGNOSIS — H538 Other visual disturbances: Secondary | ICD-10-CM | POA: Diagnosis not present

## 2017-10-10 DIAGNOSIS — M170 Bilateral primary osteoarthritis of knee: Secondary | ICD-10-CM | POA: Diagnosis not present

## 2017-10-10 DIAGNOSIS — Z131 Encounter for screening for diabetes mellitus: Secondary | ICD-10-CM | POA: Diagnosis not present

## 2017-10-10 DIAGNOSIS — Z720 Tobacco use: Secondary | ICD-10-CM | POA: Diagnosis not present

## 2017-10-10 DIAGNOSIS — I63442 Cerebral infarction due to embolism of left cerebellar artery: Secondary | ICD-10-CM | POA: Diagnosis not present

## 2017-10-10 DIAGNOSIS — I10 Essential (primary) hypertension: Secondary | ICD-10-CM | POA: Diagnosis not present

## 2017-10-10 DIAGNOSIS — E669 Obesity, unspecified: Secondary | ICD-10-CM | POA: Diagnosis not present

## 2017-10-10 DIAGNOSIS — E785 Hyperlipidemia, unspecified: Secondary | ICD-10-CM | POA: Diagnosis not present

## 2017-10-10 DIAGNOSIS — R062 Wheezing: Secondary | ICD-10-CM | POA: Diagnosis not present

## 2017-10-17 ENCOUNTER — Other Ambulatory Visit: Payer: Self-pay | Admitting: Physician Assistant

## 2017-10-17 DIAGNOSIS — R5381 Other malaise: Secondary | ICD-10-CM

## 2017-10-17 DIAGNOSIS — Z1231 Encounter for screening mammogram for malignant neoplasm of breast: Secondary | ICD-10-CM

## 2017-10-21 ENCOUNTER — Ambulatory Visit (INDEPENDENT_AMBULATORY_CARE_PROVIDER_SITE_OTHER): Payer: Self-pay | Admitting: *Deleted

## 2017-10-21 DIAGNOSIS — I639 Cerebral infarction, unspecified: Secondary | ICD-10-CM

## 2017-10-21 LAB — CUP PACEART INCLINIC DEVICE CHECK
Date Time Interrogation Session: 20181119093725
Implantable Pulse Generator Implant Date: 20181106

## 2017-10-21 NOTE — Progress Notes (Signed)
Linq wound check. Steri-strips previously removed. Incision edges approximated without redness, swelling or drainage. Some tenderness to touch. No episodes. Brady and pause detection off. Battery status: good, R waves 0.23mV. Patient educated about wound care and carelink monitoring. ROV with JA PRN.

## 2017-10-22 DIAGNOSIS — I63442 Cerebral infarction due to embolism of left cerebellar artery: Secondary | ICD-10-CM | POA: Diagnosis not present

## 2017-10-22 DIAGNOSIS — Z720 Tobacco use: Secondary | ICD-10-CM | POA: Diagnosis not present

## 2017-10-22 DIAGNOSIS — E669 Obesity, unspecified: Secondary | ICD-10-CM | POA: Diagnosis not present

## 2017-10-22 DIAGNOSIS — M170 Bilateral primary osteoarthritis of knee: Secondary | ICD-10-CM | POA: Diagnosis not present

## 2017-10-22 DIAGNOSIS — I10 Essential (primary) hypertension: Secondary | ICD-10-CM | POA: Diagnosis not present

## 2017-10-22 DIAGNOSIS — Z131 Encounter for screening for diabetes mellitus: Secondary | ICD-10-CM | POA: Diagnosis not present

## 2017-10-22 DIAGNOSIS — E785 Hyperlipidemia, unspecified: Secondary | ICD-10-CM | POA: Diagnosis not present

## 2017-10-28 ENCOUNTER — Other Ambulatory Visit: Payer: Self-pay | Admitting: Physician Assistant

## 2017-10-28 DIAGNOSIS — E2839 Other primary ovarian failure: Secondary | ICD-10-CM

## 2017-10-29 ENCOUNTER — Ambulatory Visit (INDEPENDENT_AMBULATORY_CARE_PROVIDER_SITE_OTHER): Payer: Medicare Other | Admitting: Family Medicine

## 2017-10-29 VITALS — BP 124/77 | Ht 66.5 in | Wt 306.0 lb

## 2017-10-29 DIAGNOSIS — M170 Bilateral primary osteoarthritis of knee: Secondary | ICD-10-CM | POA: Diagnosis not present

## 2017-10-29 DIAGNOSIS — M25562 Pain in left knee: Secondary | ICD-10-CM | POA: Diagnosis not present

## 2017-10-29 DIAGNOSIS — G8929 Other chronic pain: Secondary | ICD-10-CM | POA: Diagnosis not present

## 2017-10-29 DIAGNOSIS — M25561 Pain in right knee: Secondary | ICD-10-CM | POA: Diagnosis present

## 2017-10-29 DIAGNOSIS — I639 Cerebral infarction, unspecified: Secondary | ICD-10-CM

## 2017-10-29 NOTE — Patient Instructions (Signed)
We have referred you to [facility] for an evaluation and to get information for a bilateral total knee replacement.  In order for you to proceed with surgery, when you decide you'd like to do it, you need to call [place] in the billing dept at [employer]. Their number is [phone].  We have also given you information on bariatric surgery at [facility].

## 2017-10-30 ENCOUNTER — Ambulatory Visit
Admission: RE | Admit: 2017-10-30 | Discharge: 2017-10-30 | Disposition: A | Discharge status: Home / Self Care | Payer: Medicare Other | Source: Ambulatory Visit | Attending: Physician Assistant | Admitting: Physician Assistant

## 2017-10-30 DIAGNOSIS — Z13820 Encounter for screening for osteoporosis: Secondary | ICD-10-CM | POA: Diagnosis not present

## 2017-10-30 DIAGNOSIS — E2839 Other primary ovarian failure: Secondary | ICD-10-CM

## 2017-10-31 ENCOUNTER — Ambulatory Visit (INDEPENDENT_AMBULATORY_CARE_PROVIDER_SITE_OTHER): Payer: Medicare Other | Admitting: Neurology

## 2017-10-31 DIAGNOSIS — R0681 Apnea, not elsewhere classified: Secondary | ICD-10-CM

## 2017-10-31 DIAGNOSIS — G4733 Obstructive sleep apnea (adult) (pediatric): Secondary | ICD-10-CM

## 2017-10-31 DIAGNOSIS — R351 Nocturia: Secondary | ICD-10-CM

## 2017-10-31 DIAGNOSIS — G4720 Circadian rhythm sleep disorder, unspecified type: Secondary | ICD-10-CM

## 2017-10-31 DIAGNOSIS — I639 Cerebral infarction, unspecified: Secondary | ICD-10-CM

## 2017-10-31 DIAGNOSIS — Z6841 Body Mass Index (BMI) 40.0 and over, adult: Secondary | ICD-10-CM

## 2017-10-31 DIAGNOSIS — R0683 Snoring: Secondary | ICD-10-CM

## 2017-10-31 DIAGNOSIS — G4719 Other hypersomnia: Secondary | ICD-10-CM

## 2017-11-01 NOTE — Progress Notes (Signed)
  Kathleen [name] - 56 y.o. female MRN [id number]  Date of birth: 08-14-1961    SUBJECTIVE:      Chief Complaint:/ HPI:   Long-standing bilateral knee pain, right worse than left. Is working part-time at [employer]. Can only stand about 2 hours before she has to sit down. Really not able to do much of anything because of the knee. She's very frustrated by this. Does not want to consider knee surgery because she knows her weight is a problem. Stop smoking with her stroke last month.   ROS:     No fever, no unusual weight change. No numbness or tingling in her feet. No rash. No chest pain.  PERTINENT  PMH / PSH FH / / SH:  Past Medical, Surgical, Social, and Family History Reviewed & Updated in the EMR.  Pertinent findings include:  Obesity EVA with some residual weakness Hypertension Smoker, intermittent Hyperlipidemia Surgical history: Carpal tunnel release, wrist fracture surgery, tubal ligation, C-section, no surgery.  OBJECTIVE: BP 124/77   Ht 5' 6.5" (1.689 m)   Wt (!) 306 lb (138.8 kg)   BMI 48.65 kg/m   Physical Exam:  Vital signs are reviewed. GEN.: Well-developed overweight female no acute distress KNEES: Bilaterally symmetrical. External changes of osteoarthritis with synovial hypertrophy. Lacks full extension on the right by about 5. Full flexion and extension otherwise bilaterally. Ligaments intact to varus and valgus stress.  VASCULAR: Dorsalis pedis pulses 2+ bilaterally symmetrical NEURO: Intact sensation soft touch bilateral lower extremity PSYCHIATRIC: Alert and oriented 4. Affects interactive. Speech is normal fluency in content. Judgment normal.  IMAGING: Three-view knee x-rays from September 2015: Severe DJD right greater than left. Tricompartmental with significant medial compartment space loss ASSESSMENT & PLAN:  See problem based charting & AVS for pt instructions.

## 2017-11-01 NOTE — Assessment & Plan Note (Signed)
She has really severe DJD at least from a clinical standpoint. She can do very little. She is try to work part-time. I'm not sure how she's even doing this. I long discussion with her spinning greater than 50% of our 25 minute office visit counseling and education. She is stop smoking which is a plus. I would like her to see an orthopedic surgeon get an evaluation so she knows her target weight. I think she's ready to embark on preop preparation for total knee replacement but has a lot of questions and fevers. I think this is the time she needs to move forward with this. I will schedule her for orthopedic consult. I did not order x-rays today is unsure the orthopedist for want to order that will. I reviewed the was from 2015.

## 2017-11-04 ENCOUNTER — Other Ambulatory Visit: Payer: Self-pay | Admitting: Neurology

## 2017-11-04 ENCOUNTER — Telehealth: Payer: Self-pay

## 2017-11-04 DIAGNOSIS — Z6841 Body Mass Index (BMI) 40.0 and over, adult: Secondary | ICD-10-CM

## 2017-11-04 DIAGNOSIS — G4733 Obstructive sleep apnea (adult) (pediatric): Secondary | ICD-10-CM

## 2017-11-04 DIAGNOSIS — I639 Cerebral infarction, unspecified: Secondary | ICD-10-CM

## 2017-11-04 DIAGNOSIS — G4720 Circadian rhythm sleep disorder, unspecified type: Secondary | ICD-10-CM

## 2017-11-04 NOTE — Telephone Encounter (Signed)

## 2017-11-04 NOTE — Progress Notes (Signed)
Patient referred by Dr. Leta Baptist, seen by me on 10/02/17, diagnostic PSG on 10/31/17.    Please call and notify the patient that the recent sleep study did confirm the diagnosis of severe obstructive sleep apnea with a total AHI of 60/hour, REM AHI of 87/hour, supine AHI of 102.2/hour and O2 nadir of 71%. I recommend treatment for this in the form of CPAP. This will require a repeat sleep study for proper titration and mask fitting. Please explain to patient and arrange for a CPAP titration study. I have placed an order in the chart. Thanks.  Star Age, MD, PhD [facility])

## 2017-11-04 NOTE — Procedures (Signed)
PATIENT'S NAME:  Kathleen [name], Kathleen [name] DOB:      26-May-1961      MR#:    [id number]     DATE OF RECORDING: 10/31/2017 REFERRING M.D.: Dr. Leta Baptist,      PCP: Fredia Beets, FNP Study Performed:   Baseline Polysomnogram HISTORY: 56 year old woman with a history of HTN, cerebellar stroke in June 2018, hyperlipidemia, vitamin D deficiency and morbid obesity with a BMI of over 45, who reports snoring and excessive daytime somnolence.  The patient endorsed the Epworth Sleepiness Scale at 4 points. The patient's weight 306 pounds with a height of 66 (inches), resulting in a BMI of 49.2 kg/m2. The patient's neck circumference measured 16.8 inches.  CURRENT MEDICATIONS: Proventil, Xanax, Aspirin, Lipitor, Hydrochlorothiazide, Cozaar, Mobic, Desyrel, Drisdol.    PROCEDURE:  This is a multichannel digital polysomnogram utilizing the Somnostar 11.2 system.  Electrodes and sensors were applied and monitored per AASM Specifications.   EEG, EOG, Chin and Limb EMG, were sampled at 200 Hz.  ECG, Snore and Nasal Pressure, Thermal Airflow, Respiratory Effort, CPAP Flow and Pressure, Oximetry was sampled at 50 Hz. Digital video and audio were recorded.      BASELINE STUDY  Lights Out was at 22:28 and Lights On at 04:58.  Total recording time (TRT) was 391 minutes, with a total sleep time (TST) of  259 minutes. The patient's sleep latency was 38 minutes, which is delayed. REM latency was 240 minutes, which is markedly. The sleep efficiency was 66.2%, which is reduced.     SLEEP ARCHITECTURE: WASO (Wake after sleep onset) was 104 minutes with moderate to severe sleep fragmentation noted.  There were 105.5 minutes in Stage N1, 77 minutes Stage N2, 56.5 minutes Stage N3 and 20 minutes in Stage REM.  The percentage of Stage N1 was 40.7%, which is markedly increased, Stage N2 was 29.7%, Stage N3 was 21.8% and Stage R (REM sleep) was 7.7%, which is markedly reduced. The arousals were noted as: 40 were spontaneous, 0 were  associated with PLMs, 51 were associated with respiratory events.  Audio and video analysis did not show any abnormal or unusual movements, behaviors, phonations or vocalizations. The patient took 1 bathroom break. Moderate to loud snoring was noted. The EKG was in keeping with normal sinus rhythm (NSR).  RESPIRATORY ANALYSIS:  There were a total of 259 respiratory events:  48 obstructive apneas, 0 central apneas and 0 mixed apneas with a total of 48 apneas and an apnea index (AI) of 11.1 /hour. There were 211 hypopneas with a hypopnea index of 48.9 /hour. The patient also had 0 respiratory event related arousals (RERAs).      The total APNEA/HYPOPNEA INDEX (AHI) was 60/hour and the total RESPIRATORY DISTURBANCE INDEX was 60 /hour.  29 events occurred in REM sleep and 386 events in NREM. The REM AHI was 87/h, versus a non-REM AHI of 57.7. The patient spent 37 minutes of total sleep time in the supine position and 222 minutes in non-supine.. The supine AHI was 102.2/h, the non-supine AHI was 53.0/hour.  OXYGEN SATURATION & C02:  The Wake baseline 02 saturation was 93%, with the lowest being 71%. Time spent below 89% saturation equaled 97 minutes.  PERIODIC LIMB MOVEMENTS: The patient had a total of 0 Periodic Limb Movements. The Periodic Limb Movement (PLM) index was 0 and the PLM Arousal index was 0/hour.  Post-study, the patient indicated that sleep was the same as usual.   IMPRESSION:  1. Obstructive Sleep Apnea (OSA) 2. Dysfunctions associated  with sleep stages or arousal from sleep  RECOMMENDATIONS:  1. This study demonstrates severe obstructive sleep apnea, with a total AHI of 60/hour, REM AHI of 87/hour, supine AHI of 102.2/hour and O2 nadir of 71%. Treatment with positive airway pressure in the form of CPAP is recommended. This will require a full night titration study to optimize therapy. Other treatment options may include avoidance of supine sleep position along with weight loss, upper  airway or jaw surgery in selected patients or the use of an oral appliance in certain patients. ENT evaluation and/or consultation with a maxillofacial surgeon or dentist may be feasible in some instances.   2. Please note that untreated obstructive sleep apnea carries additional perioperative morbidity. Patients with significant obstructive sleep apnea should receive perioperative PAP therapy and the surgeons and particularly the anesthesiologist should be informed of the diagnosis and the severity of the sleep disordered breathing. 3. This study shows sleep fragmentation and abnormal sleep stage percentages; these are nonspecific findings and per se do not signify an intrinsic sleep disorder or a cause for the patient's sleep-related symptoms. Causes include (but are not limited to) the first night effect of the sleep study, circadian rhythm disturbances, medication effect or an underlying mood disorder or medical problem.  4. The patient should be cautioned not to drive, work at heights, or operate dangerous or heavy equipment when tired or sleepy. Review and reiteration of good sleep hygiene measures should be pursued with any patient. 5. The patient will be seen in follow-up by Dr. Rexene Alberts at [facility] for discussion of the test results and further management strategies. The referring provider will be notified of the test results.  I certify that I have reviewed the entire raw data recording prior to the issuance of this report in accordance with the Standards of Accreditation of the American Academy of Sleep Medicine (AASM)   Star Age, MD, PhD Diplomat, American Board of Psychiatry and Neurology (Neurology and Sleep Medicine)

## 2017-11-04 NOTE — Telephone Encounter (Signed)
-----   Message from Star Age, MD sent at 11/04/2017  8:11 AM EST ----- Patient referred by Dr. Leta Baptist, seen by me on 10/02/17, diagnostic PSG on 10/31/17.    Please call and notify the patient that the recent sleep study did confirm the diagnosis of severe obstructive sleep apnea with a total AHI of 60/hour, REM AHI of 87/hour, supine AHI of 102.2/hour and O2 nadir of 71%. I recommend treatment for this in the form of CPAP. This will require a repeat sleep study for proper titration and mask fitting. Please explain to patient and arrange for a CPAP titration study. I have placed an order in the chart. Thanks.  Star Age, MD, PhD [facility])

## 2017-11-05 ENCOUNTER — Encounter (INDEPENDENT_AMBULATORY_CARE_PROVIDER_SITE_OTHER): Payer: Self-pay

## 2017-11-05 ENCOUNTER — Ambulatory Visit (INDEPENDENT_AMBULATORY_CARE_PROVIDER_SITE_OTHER): Payer: Medicare Other | Admitting: Orthopaedic Surgery

## 2017-11-05 DIAGNOSIS — E785 Hyperlipidemia, unspecified: Secondary | ICD-10-CM | POA: Diagnosis not present

## 2017-11-05 DIAGNOSIS — I10 Essential (primary) hypertension: Secondary | ICD-10-CM | POA: Diagnosis not present

## 2017-11-05 DIAGNOSIS — I63442 Cerebral infarction due to embolism of left cerebellar artery: Secondary | ICD-10-CM | POA: Diagnosis not present

## 2017-11-05 DIAGNOSIS — Z720 Tobacco use: Secondary | ICD-10-CM | POA: Diagnosis not present

## 2017-11-05 DIAGNOSIS — R7303 Prediabetes: Secondary | ICD-10-CM | POA: Diagnosis not present

## 2017-11-05 DIAGNOSIS — M170 Bilateral primary osteoarthritis of knee: Secondary | ICD-10-CM | POA: Diagnosis not present

## 2017-11-07 ENCOUNTER — Encounter: Payer: Self-pay | Admitting: Cardiovascular Disease

## 2017-11-07 ENCOUNTER — Ambulatory Visit (INDEPENDENT_AMBULATORY_CARE_PROVIDER_SITE_OTHER): Payer: Medicare Other | Admitting: *Deleted

## 2017-11-07 ENCOUNTER — Ambulatory Visit (INDEPENDENT_AMBULATORY_CARE_PROVIDER_SITE_OTHER): Payer: Medicare Other | Admitting: Cardiovascular Disease

## 2017-11-07 VITALS — BP 110/72 | HR 83 | Ht 66.5 in | Wt 306.0 lb

## 2017-11-07 DIAGNOSIS — G4733 Obstructive sleep apnea (adult) (pediatric): Secondary | ICD-10-CM | POA: Diagnosis not present

## 2017-11-07 DIAGNOSIS — I639 Cerebral infarction, unspecified: Secondary | ICD-10-CM

## 2017-11-07 DIAGNOSIS — I10 Essential (primary) hypertension: Secondary | ICD-10-CM

## 2017-11-07 DIAGNOSIS — E785 Hyperlipidemia, unspecified: Secondary | ICD-10-CM

## 2017-11-07 DIAGNOSIS — I7090 Unspecified atherosclerosis: Secondary | ICD-10-CM | POA: Diagnosis not present

## 2017-11-07 NOTE — Progress Notes (Signed)
Cardiology Office Note    Date:  11/08/2017   ID:  Kathleen [name], DOB Jan 24, 1961, MRN [id number]  PCP:  Benito Mccreedy, MD  Cardiologist:  Shelva Majestic, MD    New cardiology evaluation, referred by Dr. Elgie Congo following a recent stroke.  History of Present Illness:  Kathleen [name] is a 56 y.o. female who developed new onset of headaches, dizziness and slurred speech in June 2018 and ultimately presented to the emergency room on 05/17/2017.  A CT of her head demonstrated a subacute left cerebellar stroke which was confirmed on MRI imaging.  She had a constellation of symptoms including memory loss, forgetfulness, sleep problems, racing thoughts, and also admitted to weight gain and shortness of breath.  During her hospitalization, an echo Doppler study showed an EF of 65-70% and there was grade 1 diastolic dysfunction.  There was no significant valvular pathology and there was no evidence for atrial septal defect or PFO on transthoracic echo.  Carotid duplex imaging showed bilateral mixed plaque with 1-39% narrowing.  She had antegrade vertebral flow.  Cardiac telemetry monitoring showed occasional nocturnal sinus bradycardia.  There no episodes of atrial fibrillation, there were no episodes of AV block pauses or any sustained arrhythmias.  She has a history of hypertension, obesity, hyperlipidemia, and tobacco use.  She recently saw Dr. Leta Baptist in follow-up of her hospitalization.  She has been undergoing PT and occupational therapy.  Lower extremity Doppler studies did not show evidence for deep vein or superficial thrombosis.  As part of her evaluation for new onset left cerebellar ischemic infarction with embolic appearance from unknown source she was referred to me for now referred for cardiology evaluation for possible TEE and potential loop recorder if the TEE is negative.    Since I initially saw her, she underwent a TEE on 10/08/2017 which showed a dilated left atrium with  "smoke" but without thrombus.  She had normal appendage emptying velocities.  Showed normal left ventricular ejection fraction with normal valves.  There was 2+ tricuspid insufficiency.  There was no significant aortic atherosclerosis.  Later that day, she underwent successful insertion of an implantable loop recorder which was done by Dr. Rayann Heman.  She is now enrolled in the surveillance protocol.  She tells me she also underwent a sleep study at [facility] neurology and tested positive for sleep apnea.  She is awaiting her CPAP titration trial.  She denies chest pain.  She denies any recurrent neurologic symptoms.  She denies bleeding.  She presents for evaluation.  Past Medical History:  Diagnosis Date  . Anxiety   . Asthma   . Hypertension    NO MED MD TO GIVE RX  . Obesity   . Stroke [facility]) 05/2017    Past Surgical History:  Procedure Laterality Date  . CARPAL TUNNEL RELEASE  01/12/2012   Procedure: CARPAL TUNNEL RELEASE;  Surgeon: Schuyler Amor, MD;  Location: [place];  Service: Orthopedics;  Laterality: Right;  . CESAREAN SECTION    . LOOP RECORDER INSERTION N/A 10/08/2017   Procedure: LOOP RECORDER INSERTION;  Surgeon: Thompson Grayer, MD;  Location: [place] [facility];  Service: Cardiovascular;  Laterality: N/A;  . NOSE SURGERY     BROKEN   MANY YRS AGO   . TEE WITHOUT CARDIOVERSION N/A 10/08/2017   Procedure: TRANSESOPHAGEAL ECHOCARDIOGRAM (TEE);  Surgeon: Sanda Klein, MD;  Location: [place];  Service: Cardiovascular;  Laterality: N/A;  . TUBAL LIGATION    . WRIST FRACTURE SURGERY  Feb  2013    Current Medications: Outpatient Medications Prior to Visit  Medication Sig Dispense Refill  . albuterol (PROVENTIL HFA;VENTOLIN HFA) 108 (90 BASE) MCG/ACT inhaler Inhale 2 puffs into the lungs every 4 (four) hours as needed for wheezing or shortness of breath. 1 Inhaler 0  . ALPRAZolam (XANAX) 0.5 MG tablet Take 1 tablet (0.5 mg total) by mouth as needed for anxiety (for sedation  before MRI scan; take 1 hour before scan; may repeat 15 min before scan). 3 tablet 0  . aspirin 325 MG tablet Take 1 tablet (325 mg total) by mouth daily. 90 tablet 3  . atorvastatin (LIPITOR) 40 MG tablet Take 1 tablet (40 mg total) by mouth daily at 6 PM. 30 tablet 0  . cyclobenzaprine (FLEXERIL) 10 MG tablet Take 10 mg by mouth at bedtime as needed for muscle spasms.    . hydrochlorothiazide (HYDRODIURIL) 25 MG tablet Take 0.5 tablets (12.5 mg total) by mouth daily. 30 tablet 0  . losartan (COZAAR) 100 MG tablet Take 1 tablet (100 mg total) by mouth daily. (Patient taking differently: Take 50 mg by mouth daily. ) 30 tablet 0  . meloxicam (MOBIC) 7.5 MG tablet Take 1 tablet (7.5 mg total) by mouth daily. (Patient taking differently: Take 7.5 mg by mouth daily as needed for pain. ) 30 tablet 2  . traZODone (DESYREL) 50 MG tablet Take 0.5-1 tablets (25-50 mg total) by mouth at bedtime as needed for sleep. 30 tablet 0  . Vitamin D, Ergocalciferol, (DRISDOL) 50000 units CAPS capsule Take 50,000 Units by mouth once a week.   2  . naproxen sodium (ALEVE) 220 MG tablet Take 440-660 mg by mouth daily as needed (pain).     No facility-administered medications prior to visit.      Allergies:   Hydrocodone; Lisinopril; and Tramadol   Social History   Socioeconomic History  . Marital status: Widowed    Spouse name: None  . Number of children: None  . Years of education: None  . Highest education level: None  Social Needs  . Financial resource strain: None  . Food insecurity - worry: None  . Food insecurity - inability: None  . Transportation needs - medical: None  . Transportation needs - non-medical: None  Occupational History  . None  Tobacco Use  . Smoking status: Former Smoker    Last attempt to quit: 05/17/2017    Years since quitting: 0.4  . Smokeless tobacco: Never Used  . Tobacco comment: Smoking about 6 cigs/week  Substance and Sexual Activity  . Alcohol use: Yes     Alcohol/week: 12.6 oz    Types: 21 Glasses of wine per week    Comment: DAILY ALCOHOL and WINE  . Drug use: No  . Sexual activity: Yes    Birth control/protection: None  Other Topics Concern  . None  Social History Narrative   Lives home with S.O. Vivien Presto.  Education level 12th grade.    Drinks coffee.  Has one child.     Additional social history is notable in that she was born in [place], [place].  She completed 12th grade of education.  She is in her second marriage and is remarried for 1 year.  She has one son who is 46 years old.  She  works at [employer] , but has not been week working recently.  Family History:  The patient's family history includes Diabetes in her father; Hypertension in her father.  Mother is currently 43.  Her father, diabetes mellitus, status post lower extremity amputation, has suffered an MI as well as a history of CVA.  She has 3 sisters.  Twin  brothers both died from cancer.  ROS General: Negative; No fevers, chills, or night sweats; positive for morbid obesity HEENT: Negative; No changes in vision or hearing, sinus congestion, difficulty swallowing Pulmonary: Negative; No cough, wheezing, shortness of breath, hemoptysis Cardiovascular: Negative; No chest pain, presyncope, syncope, palpitations GI: Negative; No nausea, vomiting, diarrhea, or abdominal pain GU: Negative; No dysuria, hematuria, or difficulty voiding Musculoskeletal: Negative; no myalgias, joint pain, or weakness Hematologic/Oncology: Negative; no easy bruising, bleeding Endocrine: Negative; no heat/cold intolerance; no diabetes Neuro: Positive for cryptogenic stroke Skin: Negative; No rashes or skin lesions Psychiatric: Positive for racing thoughts. Sleep: Positive for snoring, nonrestorative sleep, nocturia 2-3 times per night, nodaytime sleepiness, hypersomnolence, bruxism, restless legs, hypnogognic hallucinations, no cataplexy Other comprehensive 14 point system  review is negative.   PHYSICAL EXAM:   VS:  BP 110/72   Pulse 83   Ht 5' 6.5" (1.689 m)   Wt (!) 306 lb (138.8 kg)   BMI 48.65 kg/m     Repeat blood pressure is 112/70.  Wt Readings from Last 3 Encounters:  11/07/17 (!) 306 lb (138.8 kg)  10/29/17 (!) 306 lb (138.8 kg)  10/08/17 (!) 306 lb (138.8 kg)    General: Alert, oriented, no distress.  Skin: normal turgor, no rashes, warm and dry HEENT: Normocephalic, atraumatic. Pupils equal round and reactive to light; sclera anicteric; extraocular muscles intact;  Nose without nasal septal hypertrophy Mouth/Parynx benign; Mallinpatti scale 3/4 Neck: No JVD, no carotid bruits; normal carotid upstroke Lungs: clear to ausculatation and percussion; no wheezing or rales Chest wall: without tenderness to palpitation Heart: PMI not displaced, RRR, s1 s2 normal, 1/6 systolic murmur, no diastolic murmur, no rubs, gallops, thrills, or heaves Abdomen: Central adiposity; soft, nontender; no hepatosplenomehaly, BS+; abdominal aorta nontender and not dilated by palpation. Back: no CVA tenderness Pulses 2+ Musculoskeletal: full range of motion, normal strength, no joint deformities Extremities: no clubbing cyanosis or edema, Homan's sign negative  Neurologic: grossly nonfocal; Cranial nerves grossly wnl Psychologic: Normal mood and affect    Studies/Labs Reviewed:   EKG:  EKG is ordered today.  ECG (independently read by me): Normal sinus rhythm at 83 bpm.  No ectopy.  Normal intervals.  09/19/2017 ECG (independently read by me):Normal sinus rhythm at 70 bpm.  No ectopy.  Normal intervals.  Recent Labs: BMP Latest Ref Rng & Units 06/19/2017 05/19/2017 05/17/2017  Glucose 65 - 99 mg/dL 91 103(H) 121(H)  BUN 6 - 24 mg/dL '15 14 8  ' Creatinine 0.57 - 1.00 mg/dL 0.72 0.87 0.90  BUN/Creat Ratio 9 - 23 21 - -  Sodium 134 - 144 mmol/L 142 137 136  Potassium 3.5 - 5.2 mmol/L 3.8 4.0 3.7  Chloride 96 - 106 mmol/L 102 105 101  CO2 20 - 29 mmol/L '27 25  26  ' Calcium 8.7 - 10.2 mg/dL 11.5(H) 10.1 11.0(H)     Hepatic Function Latest Ref Rng & Units 06/19/2017 05/17/2017 05/16/2017  Total Protein 6.0 - 8.5 g/dL 7.0 7.4 7.0  Albumin 3.5 - 5.5 g/dL 4.1 3.9 4.3  AST 0 - 40 IU/L '18 26 23  ' ALT 0 - 32 IU/L '19 23 17  ' Alk Phosphatase 39 - 117 IU/L 105 69 79  Total Bilirubin 0.0 - 1.2 mg/dL 0.3 0.7 0.3    CBC Latest Ref Rng & Units 06/19/2017 05/19/2017 05/17/2017  WBC 3.4 - 10.8 x10E3/uL 8.8 7.7 9.3  Hemoglobin 11.1 - 15.9 g/dL 12.3 12.1 13.7  Hematocrit 34.0 - 46.6 % 38.5 38.8 44.8  Platelets 150 - 379 x10E3/uL 228 161 197   Lab Results  Component Value Date   MCV 84 06/19/2017   MCV 87.0 05/19/2017   MCV 87.0 05/17/2017   Lab Results  Component Value Date   TSH 1.950 06/19/2017   Lab Results  Component Value Date   HGBA1C 5.6 05/18/2017     BNP No results found for: BNP  ProBNP No results found for: PROBNP   Lipid Panel     Component Value Date/Time   CHOL 141 06/19/2017 1157   TRIG 66 06/19/2017 1157   HDL 58 06/19/2017 1157   CHOLHDL 2.4 06/19/2017 1157   CHOLHDL 3.3 05/18/2017 0114   VLDL 16 05/18/2017 0114   LDLCALC 70 06/19/2017 1157     RADIOLOGY: Dg Bone Density  Result Date: 10/30/2017 EXAM: DUAL X-RAY ABSORPTIOMETRY (DXA) FOR BONE MINERAL DENSITY IMPRESSION: Referring Physician:  Trey Sailors PATIENT: Name: Kathleen [name], Kathleen [name] Patient ID:  [id number] Birth Date:  [birth year]           Height:     65.0 in. Sex:         Female Measured:  10/30/2017            Weight:     289.0 lbs. Indications: Estrogen Deficient, Perimenopause, Tobacco User (Current Smoker) Fractures: Ankle, Right wrist Treatments: Vitamin D (E933.5) ASSESSMENT: The BMD measured at Femur Neck Left is 0.927 g/cm2 with a T-score of -0.8. This patient is considered NORMAL according to [facility]) criteria. Site Region Measured Date Measured Age YA T-score BMD Significant CHANGE DualFemur Neck Left 10/30/2017    54.7         -0.8     0.927 g/cm2 AP Spine  L1-L4     10/30/2017    54.7         -0.1    1.188 g/cm2 World Health Organization [facility]) criteria for post-menopausal, Caucasian Women: Normal       T-score at or above -1 SD Osteopenia   T-score between -1 and -2.5 SD Osteoporosis T-score at or below -2.5 SD RECOMMENDATION: [place] recommends that FDA-approved medical therapies be considered in postmenopausal women and men age 96 or older with a: 1. Hip or vertebral (clinical or morphometric) fracture. 2. T-score of <-2.5 at the spine or hip. 3. Ten-year fracture probability by FRAX of 3% or greater for hip fracture or 20% or greater for major osteoporotic fracture. All treatment decisions require clinical judgment and consideration of individual patient factors, including patient preferences, co-morbidities, previous drug use, risk factors not captured in the FRAX model (e.g. falls, vitamin D deficiency, increased bone turnover, interval significant decline in bone density) and possible under - or over-estimation of fracture risk by FRAX. All patients should ensure an adequate intake of dietary calcium (1200 mg/d) and vitamin D (800 IU daily) unless contraindicated. FOLLOW-UP: People with diagnosed cases of osteoporosis or at high risk for fracture should have regular bone mineral density tests. For patients eligible for Medicare, routine testing is allowed once every 2 years. The testing frequency can be increased to one year for patients who have rapidly progressing disease, those who are receiving or discontinuing medical therapy to restore bone mass, or have additional risk factors. I have reviewed this report, and agree with the above findings. Mark A. Thornton Papas, M.D. [facility] Electronically Signed   By: Lavonia Dana M.D.   On: 10/30/2017 13:30     Additional studies/ records that were reviewed today include:  I reviewed the patient's hospitalization from June 15 through June 17.  I reviewed.  Her  head CT, MRI, 2-D echo Doppler study, carotid studies, and office notes from neurology.  I have also reviewed laboratory.  I reviewed her TEE as well as implantable loop recorder procedure   ASSESSMENT:    1. Cryptogenic stroke ([place])   2. Essential hypertension   3. Hyperlipidemia, unspecified hyperlipidemia type   4. Morbid obesity ([place])   5. OSA (obstructive sleep apnea)   6. Atherosclerosis      PLAN:  Kathleen [name] is a 56 year old African-American female was a history of morbid obesity and develop symptoms secondary to acute left inferior cerebellum infarct without hemorrhage leading to her presentation to the hospital in June.  Of note, her MRA confirmed the acute left inferior cervical without hemorrhage, and also showed chronic microhemorrhage right medial thalamus.  She was also found to have mild intracranial atherosclerotic disease in the anterior cerebral arteries and right middle cerebral artery without large vessel occlusion.  A 2-D echo Doppler study did not reveal any significant pathology and she had hyperdynamic LV function with grade 1 diastolic dysfunction.  Her blood pressure today is stable and on repeat by me was 112/70 on hydrochlorothiazide 12.5 mg daily , and losartan 100 mg.  She continues to be on atorvastatin 40 mg for hyperlipidemia with target LDL less than 70.  Laboratory 4 months ago showed her LDL at 70 with a total cholesterol 141, triglycerides 66, and HDL 58.  She is on full dose aspirin per neurology.  I reviewed her TEE report with her in detail.  She underwent implantable loop recorder.  Her site is stable.  She will be undergoing surveillance monitoring of her loop recorder and will be seeing Dr. Rayann Heman as needed.  She tells me her sleep study was positive for sleep apnea and she had significant oxygen desaturation.  She will initiate CPAP therapy following CPAP titration trial.  We discussed the importance of weight loss.  She has documented subclinical  atherosclerosis on MRI imaging.  Her BMI is 48.65 and is consistent with morbid obesity.  I will see her in 6 months for reevaluation.    Medication Adjustments/Labs and Tests Ordered: Current medicines are reviewed at length with the patient today.  Concerns regarding medicines are outlined above.  Medication changes, Labs and Tests ordered today are listed in the Patient Instructions below. Patient Instructions  Medication Instructions:  Your physician recommends that you continue on your current medications as directed. Please refer to the Current Medication list given to you today.  Follow-Up: Your physician wants you to follow-up in: 6 months with Dr. Claiborne Billings. You will receive a reminder letter in the mail two months in advance. If you don't receive a letter, please call our office to schedule the follow-up appointment.   Any Other Special Instructions Will Be Listed Below (If Applicable).     If you need a refill on your cardiac medications before your next appointment, please call your pharmacy.      Signed, Shelva Majestic, MD  11/08/2017 1:45 PM    [facility] [address] [place], [place], [place]  [address] Phone: [phone]

## 2017-11-07 NOTE — Progress Notes (Signed)
Carelink Summary Report / Loop Recorder 

## 2017-11-07 NOTE — Patient Instructions (Signed)

## 2017-11-08 ENCOUNTER — Encounter: Payer: Self-pay | Admitting: Cardiovascular Disease

## 2017-11-16 LAB — CUP PACEART REMOTE DEVICE CHECK
Date Time Interrogation Session: 20181206163655
MDC IDC PG IMPLANT DT: 20181106

## 2017-11-19 ENCOUNTER — Encounter (INDEPENDENT_AMBULATORY_CARE_PROVIDER_SITE_OTHER): Payer: Self-pay | Admitting: Orthopaedic Surgery

## 2017-11-19 ENCOUNTER — Ambulatory Visit (INDEPENDENT_AMBULATORY_CARE_PROVIDER_SITE_OTHER): Payer: Medicare Other

## 2017-11-19 ENCOUNTER — Ambulatory Visit (INDEPENDENT_AMBULATORY_CARE_PROVIDER_SITE_OTHER): Payer: Medicare Other | Admitting: Orthopaedic Surgery

## 2017-11-19 DIAGNOSIS — G8929 Other chronic pain: Secondary | ICD-10-CM

## 2017-11-19 DIAGNOSIS — M25562 Pain in left knee: Secondary | ICD-10-CM

## 2017-11-19 DIAGNOSIS — M1712 Unilateral primary osteoarthritis, left knee: Secondary | ICD-10-CM

## 2017-11-19 DIAGNOSIS — M1711 Unilateral primary osteoarthritis, right knee: Secondary | ICD-10-CM | POA: Diagnosis not present

## 2017-11-19 DIAGNOSIS — M25561 Pain in right knee: Secondary | ICD-10-CM | POA: Diagnosis not present

## 2017-11-19 DIAGNOSIS — I639 Cerebral infarction, unspecified: Secondary | ICD-10-CM | POA: Diagnosis not present

## 2017-11-19 NOTE — Progress Notes (Signed)
Office Visit Note   Patient: Kathleen [name]           Date of Birth: 1961/05/25           MRN: [id number] Visit Date: 11/19/2017              Requested by: Benito Mccreedy, MD [address] [place], [place] [address] PCP: Benito Mccreedy, MD   Assessment & Plan: Visit Diagnoses:  1. Chronic pain of both knees   2. Unilateral primary osteoarthritis, right knee   3. Unilateral primary osteoarthritis, left knee     Plan: Impression is end-stage degenerative joint disease bilateral knees.  Patient does have BMI of 48.  She is non-smoker and nondiabetic.  She would like to proceed with right total knee replacement after full discussion of risk benefits alternatives of surgery.  We will do everything we can to minimize her risk based on evidence based medicine.  We will first need preoperative medical and cardiac clearance.  Questions encouraged and answered. Total face to face encounter time was greater than 45 minutes and over half of this time was spent in counseling and/or coordination of care.  Follow-Up Instructions: Return if symptoms worsen or fail to improve.   Orders:  Orders Placed This Encounter  Procedures  . XR KNEE 3 VIEW RIGHT  . XR KNEE 3 VIEW LEFT   No orders of the defined types were placed in this encounter.     Procedures: No procedures performed   Clinical Data: No additional findings.   Subjective: Chief Complaint  Patient presents with  . Right Knee - Pain  . Left Knee - Pain    Patient is a very pleasant 56 year old female comes in with bilateral knee pain has significant difficulty with ADLs and chronic night pain.  She has had multiple cortisone injections in the past with minimal relief.  She takes meloxicam on a daily basis.  She did have a stroke back in June of this year.  She denies any numbness and tingling.  Denies any injuries.    Review of Systems  Constitutional: Negative.   HENT: Negative.   Eyes: Negative.     Respiratory: Negative.   Cardiovascular: Negative.   Endocrine: Negative.   Musculoskeletal: Negative.   Neurological: Negative.   Hematological: Negative.   Psychiatric/Behavioral: Negative.   All other systems reviewed and are negative.    Objective: Vital Signs: There were no vitals taken for this visit.  Physical Exam  Constitutional: She is oriented to person, place, and time. She appears well-developed and well-nourished.  HENT:  Head: Normocephalic and atraumatic.  Eyes: EOM are normal.  Neck: Neck supple.  Pulmonary/Chest: Effort normal.  Abdominal: Soft.  Neurological: She is alert and oriented to person, place, and time.  Skin: Skin is warm. Capillary refill takes less than 2 seconds.  Psychiatric: She has a normal mood and affect. Her behavior is normal. Judgment and thought content normal.  Nursing note and vitals reviewed.   Ortho Exam Bilateral knee exam shows no joint effusion.  Collaterals and cruciates are stable.  Medial joint line tenderness.  Varus alignment.  Significant patellar crepitus. Specialty Comments:  No specialty comments available.  Imaging: Xr Knee 3 View Left  Result Date: 11/19/2017 Advanced degenerative joint disease with varus deformity  Xr Knee 3 View Right  Result Date: 11/19/2017 Advanced degenerative joint disease with varus deformity    PMFS History: Patient Active Problem List   Diagnosis Date Noted  .  Obesity, Class III, BMI 40-49.9 (morbid obesity) ([place])   . Hyperlipidemia   . Tobacco abuse   . Cerebral embolism with cerebral infarction 05/17/2017  . Cryptogenic stroke ([place]) 05/17/2017  . Nausea & vomiting 05/17/2017  . Hypertension 05/16/2017  . DJD (degenerative joint disease) of knee 07/01/2014   Past Medical History:  Diagnosis Date  . Anxiety   . Asthma   . Hypertension    NO MED MD TO GIVE RX  . Obesity   . Stroke [facility]) 05/2017    Family History  Problem Relation Age of Onset  . Hypertension  Father   . Diabetes Father     Past Surgical History:  Procedure Laterality Date  . CARPAL TUNNEL RELEASE  01/12/2012   Procedure: CARPAL TUNNEL RELEASE;  Surgeon: Schuyler Amor, MD;  Location: [place];  Service: Orthopedics;  Laterality: Right;  . CESAREAN SECTION    . LOOP RECORDER INSERTION N/A 10/08/2017   Procedure: LOOP RECORDER INSERTION;  Surgeon: Thompson Grayer, MD;  Location: [place] [facility];  Service: Cardiovascular;  Laterality: N/A;  . NOSE SURGERY     BROKEN   MANY YRS AGO   . TEE WITHOUT CARDIOVERSION N/A 10/08/2017   Procedure: TRANSESOPHAGEAL ECHOCARDIOGRAM (TEE);  Surgeon: Sanda Klein, MD;  Location: [facility];  Service: Cardiovascular;  Laterality: N/A;  . TUBAL LIGATION    . WRIST FRACTURE SURGERY  Feb 2013   Social History   Occupational History  . Not on file  Tobacco Use  . Smoking status: Former Smoker    Last attempt to quit: 05/17/2017    Years since quitting: 0.5  . Smokeless tobacco: Never Used  . Tobacco comment: Smoking about 6 cigs/week  Substance and Sexual Activity  . Alcohol use: Yes    Alcohol/week: 12.6 oz    Types: 21 Glasses of wine per week    Comment: DAILY ALCOHOL and WINE  . Drug use: No  . Sexual activity: Yes    Birth control/protection: None

## 2017-11-21 ENCOUNTER — Telehealth: Payer: Self-pay | Admitting: *Deleted

## 2017-11-21 NOTE — Telephone Encounter (Signed)
   [facility] Pre-operative Risk Assessment    Request for surgical clearance:  1. What type of surgery is being performed? RIGHT TOTAL KNEE ARTHROPLASTY    2. When is this surgery scheduled? PENDING CLEARANCE   3. Are there any medications that need to be held prior to surgery and how long?NONE   4. Practice name and name of physician performing surgery? DR [facility]   5. What is your office phone and fax number? PH= [phone]  FAX=[phone]   6. Anesthesia type (None, local, MAC, general) ? UNKNOWN   Kathleen [name] 11/21/2017, 5:36 PM  _________________________________________________________________   (provider comments below)

## 2017-11-22 NOTE — Telephone Encounter (Signed)
Per Cecilie Kicks, NP to let Dr. Erlinda Hong with [facility] they would need to call Dr. Erlinda Hong Neurologist in regards to ASA for Right TKA. Ortho office is agreeable and thanked [place] for the call today.

## 2017-11-22 NOTE — Telephone Encounter (Signed)
Dr. Claiborne Billings - you just saw pt I believe she is fine for knee surgery but I will have ortho check with Neuro with CVA and ASA.  Please give me you opinion if ok.

## 2017-11-22 NOTE — Telephone Encounter (Signed)
Please notify Dr. Phoebe Sharps office that pt is on ASA for CVA and they should get ok from Neuro to stop ASA

## 2017-12-09 ENCOUNTER — Ambulatory Visit (INDEPENDENT_AMBULATORY_CARE_PROVIDER_SITE_OTHER): Payer: Medicare Other | Admitting: *Deleted

## 2017-12-09 DIAGNOSIS — I639 Cerebral infarction, unspecified: Secondary | ICD-10-CM

## 2017-12-09 NOTE — Progress Notes (Signed)
Carelink Summary Report / Loop Recorder 

## 2017-12-17 ENCOUNTER — Ambulatory Visit: Payer: Medicare Other | Admitting: Diagnostic Neuroimaging

## 2017-12-20 LAB — CUP PACEART REMOTE DEVICE CHECK
Implantable Pulse Generator Implant Date: 20181106
MDC IDC SESS DTM: 20190105170602

## 2017-12-31 DIAGNOSIS — Z0000 Encounter for general adult medical examination without abnormal findings: Secondary | ICD-10-CM | POA: Diagnosis not present

## 2017-12-31 DIAGNOSIS — I10 Essential (primary) hypertension: Secondary | ICD-10-CM | POA: Diagnosis not present

## 2017-12-31 DIAGNOSIS — E785 Hyperlipidemia, unspecified: Secondary | ICD-10-CM | POA: Diagnosis not present

## 2017-12-31 DIAGNOSIS — R7303 Prediabetes: Secondary | ICD-10-CM | POA: Diagnosis not present

## 2017-12-31 DIAGNOSIS — M170 Bilateral primary osteoarthritis of knee: Secondary | ICD-10-CM | POA: Diagnosis not present

## 2017-12-31 DIAGNOSIS — I63442 Cerebral infarction due to embolism of left cerebellar artery: Secondary | ICD-10-CM | POA: Diagnosis not present

## 2017-12-31 DIAGNOSIS — Z720 Tobacco use: Secondary | ICD-10-CM | POA: Diagnosis not present

## 2018-01-06 ENCOUNTER — Ambulatory Visit (INDEPENDENT_AMBULATORY_CARE_PROVIDER_SITE_OTHER): Payer: Medicare Other | Admitting: *Deleted

## 2018-01-06 DIAGNOSIS — I639 Cerebral infarction, unspecified: Secondary | ICD-10-CM | POA: Diagnosis not present

## 2018-01-07 NOTE — Progress Notes (Signed)
Carelink Summary Report / Loop Recorder 

## 2018-01-21 ENCOUNTER — Ambulatory Visit (INDEPENDENT_AMBULATORY_CARE_PROVIDER_SITE_OTHER): Payer: Medicare Other | Admitting: Neurology

## 2018-01-21 DIAGNOSIS — G4733 Obstructive sleep apnea (adult) (pediatric): Secondary | ICD-10-CM | POA: Diagnosis not present

## 2018-01-21 DIAGNOSIS — I639 Cerebral infarction, unspecified: Secondary | ICD-10-CM

## 2018-01-21 DIAGNOSIS — G4720 Circadian rhythm sleep disorder, unspecified type: Secondary | ICD-10-CM

## 2018-01-21 DIAGNOSIS — Z6841 Body Mass Index (BMI) 40.0 and over, adult: Secondary | ICD-10-CM

## 2018-01-22 ENCOUNTER — Other Ambulatory Visit: Payer: Self-pay | Admitting: Neurology

## 2018-01-22 ENCOUNTER — Telehealth: Payer: Self-pay

## 2018-01-22 DIAGNOSIS — F54 Psychological and behavioral factors associated with disorders or diseases classified elsewhere: Secondary | ICD-10-CM | POA: Diagnosis not present

## 2018-01-22 DIAGNOSIS — F329 Major depressive disorder, single episode, unspecified: Secondary | ICD-10-CM | POA: Diagnosis not present

## 2018-01-22 DIAGNOSIS — Z7189 Other specified counseling: Secondary | ICD-10-CM | POA: Diagnosis not present

## 2018-01-22 DIAGNOSIS — G4733 Obstructive sleep apnea (adult) (pediatric): Secondary | ICD-10-CM

## 2018-01-22 NOTE — Telephone Encounter (Signed)
-----   Message from Star Age, MD sent at 01/22/2018  8:49 AM EST ----- Patient referred by Dr. Leta Baptist, seen by me on 10/02/17, diagnostic PSG on 10/31/17. Patient had a CPAP titration study on 01/21/18.  Please call and inform patient that I have entered an order for treatment with positive airway pressure (PAP) treatment for obstructive sleep apnea (OSA). She did well during the latest sleep study with CPAP. We will, therefore, arrange for a machine for home use through a DME (durable medical equipment) company of Her choice; and I will see the patient back in follow-up in about 10 weeks. Please also explain to the patient that I will be looking out for compliance data, which can be downloaded from the machine (stored on an SD card, that is inserted in the machine) or via remote access through a modem, that is built into the machine. At the time of the followup appointment we will discuss sleep study results and how it is going with PAP treatment at home. Please advise patient to bring Her machine at the time of the first FU visit, even though this is cumbersome. Bringing the machine for every visit after that will likely not be needed, but often helps for the first visit to troubleshoot if needed. Please re-enforce the importance of compliance with treatment and the need for [place] to monitor compliance data - often an insurance requirement and actually good feedback for the patient as far as how they are doing.  Also remind patient, that any interim PAP machine or mask issues should be first addressed with the DME company, as they can often help better with technical and mask fit issues. Please ask if patient has a preference regarding DME company.  Please also make sure, the patient has a follow-up appointment with me in about 10 weeks from the setup date, thanks. May see one of our nurse practitioners if needed for proper timing of the FU appointment.  Please fax or rout report to the referring provider.  Thanks,   Star Age, MD, PhD [facility])

## 2018-01-22 NOTE — Progress Notes (Signed)
Patient referred by Dr. Leta Baptist, seen by me on 10/02/17, diagnostic PSG on 10/31/17. Patient had a CPAP titration study on 01/21/18.  Please call and inform patient that I have entered an order for treatment with positive airway pressure (PAP) treatment for obstructive sleep apnea (OSA). She did well during the latest sleep study with CPAP. We will, therefore, arrange for a machine for home use through a DME (durable medical equipment) company of Her choice; and I will see the patient back in follow-up in about 10 weeks. Please also explain to the patient that I will be looking out for compliance data, which can be downloaded from the machine (stored on an SD card, that is inserted in the machine) or via remote access through a modem, that is built into the machine. At the time of the followup appointment we will discuss sleep study results and how it is going with PAP treatment at home. Please advise patient to bring Her machine at the time of the first FU visit, even though this is cumbersome. Bringing the machine for every visit after that will likely not be needed, but often helps for the first visit to troubleshoot if needed. Please re-enforce the importance of compliance with treatment and the need for [place] to monitor compliance data - often an insurance requirement and actually good feedback for the patient as far as how they are doing.  Also remind patient, that any interim PAP machine or mask issues should be first addressed with the DME company, as they can often help better with technical and mask fit issues. Please ask if patient has a preference regarding DME company.  Please also make sure, the patient has a follow-up appointment with me in about 10 weeks from the setup date, thanks. May see one of our nurse practitioners if needed for proper timing of the FU appointment.  Please fax or rout report to the referring provider. Thanks,   Kathleen Age, MD, PhD [facility])

## 2018-01-22 NOTE — Procedures (Signed)
PATIENT'S NAME:  Kathleen [name], Kathleen [name] DOB:      [birth year]      MR#:    [id number]     DATE OF RECORDING: 01/21/2018 REFERRING M.D.: Dr. Leta Baptist,          PCP: Fredia Beets, FNP Study Performed:   CPAP  Titration HISTORY: 57 year old woman with a history of HTN, cerebellar stroke in June 2018, hyperlipidemia, vitamin D deficiency and morbid obesity with a BMI of over 45, who returns for a CPAP titration. She had a baseline sleep study with [place] on 10/31/17 with a total AHI of 60/hour, REM AHI of 87/hour, supine AHI of 102.2/hour, and 02 nadir of 71%. BMI of 49.2 kg/m2. The patient's neck circumference measured 16.8 inches.  CURRENT MEDICATIONS: Proventil, Xanax, Aspirin, Lipitor, Hydrochlorothiazide, Cozaar, Mobic, Desyrel, Drisdol,   PROCEDURE:  This is a multichannel digital polysomnogram utilizing the SomnoStar 11.2 system.  Electrodes and sensors were applied and monitored per AASM Specifications.   EEG, EOG, Chin and Limb EMG, were sampled at 200 Hz.  ECG, Snore and Nasal Pressure, Thermal Airflow, Respiratory Effort, CPAP Flow and Pressure, Oximetry was sampled at 50 Hz. Digital video and audio were recorded.      The patient was fitted with medium nasal pillows, P10. CPAP was initiated at 6 cmH20 with heated humidity per AASM standards and pressure was advanced to 12 cmH20 because of hypopneas, apneas and desaturations.  At a PAP pressure of 12 cmH20, there was a reduction of the AHI to 0 with non-supine REM sleep achieved and O2 nadir of 91%.    Lights Out was at 21:25 and Lights On at 05:06. Total recording time (TRT) was 461.5 minutes, with a total sleep time (TST) of 435.5 minutes. The patient's sleep latency was 6.5 minutes with 1 minutes of wake time after sleep onset. REM latency was 126.5 minutes, which is mildly delaye. The sleep efficiency was 94.4 %.    SLEEP ARCHITECTURE: WASO (Wake after sleep onset) was 18.5 minutes.  There were 11 minutes in Stage N1, 162 minutes Stage N2, 152.5  minutes Stage N3 and 110 minutes in Stage REM. The percentage of Stage N1 was 2.5%, Stage N2 was 37.2%, Stage N3 was 35.%, which is increased, and Stage R (REM sleep) was 25.3%, which is high normal.   Audio and video analysis did not show any abnormal or unusual movements, behaviors, phonations or vocalizations. The patient took no bathroom breaks. EKG was in keeping with normal sinus rhythm (NSR).  RESPIRATORY ANALYSIS:  There was a total of 6 respiratory events: 0 obstructive apneas, 0 central apneas and 0 mixed apneas with a total of 0 apneas and an apnea index (AI) of 0 /hour. There were 6 hypopneas with a hypopnea index of .8/hour. The patient also had 0 respiratory event related arousals (RERAs).      The total APNEA/HYPOPNEA INDEX  (AHI) was .8 /hour and the total RESPIRATORY DISTURBANCE INDEX was .8 .hour  5 events occurred in REM sleep and 1 events in NREM. The REM AHI was 2.7 /hour versus a non-REM AHI of .2 /hour.  The patient spent 0 minutes of total sleep time in the supine position and 436 minutes in non-supine. The supine AHI was 0.0, versus a non-supine AHI of 0.8.  OXYGEN SATURATION & C02:  The baseline 02 saturation was 98%, with the lowest being 81%. Time spent below 89% saturation equaled 4 minutes.  PERIODIC LIMB MOVEMENTS: The patient had a total of 0 Periodic Limb Movements.  The Periodic Limb Movement (PLM) index was 0 and the PLM Arousal index was 0 /hour.  Post-study, the patient indicated that sleep was better than usual.   IMPRESSION:  1. Obstructive Sleep Apnea (OSA)   RECOMMENDATIONS:  1. This study demonstrates resolution of the patient's obstructive sleep apnea with CPAP therapy. I will, therefore, start the patient on home CPAP treatment at a pressure of 12 cm via medium nasal pillows with heated humidity. The patient should be reminded to be fully compliant with PAP therapy to improve sleep related symptoms and decrease long term cardiovascular risks. The patient  should be reminded, that it may take up to 3 months to get fully used to using PAP with all planned sleep. The earlier full compliance is achieved, the better long term compliance tends to be. Please note that untreated obstructive sleep apnea carries additional perioperative morbidity. Patients with significant obstructive sleep apnea should receive perioperative PAP therapy and the surgeons and particularly the anesthesiologist should be informed of the diagnosis and the severity of the sleep disordered breathing. 2. The patient should be cautioned not to drive, work at heights, or operate dangerous or heavy equipment when tired or sleepy. Review and reiteration of good sleep hygiene measures should be pursued with any patient. 3. The patient will be seen in follow-up by Dr. Rexene Alberts at [facility] for discussion of the test results and further management strategies. The referring provider will be notified of the test results.   I certify that I have reviewed the entire raw data recording prior to the issuance of this report in accordance with the Standards of Accreditation of the American Academy of Sleep Medicine (AASM)     Star Age, MD, PhD Diplomat, American Board of Psychiatry and Neurology (Neurology and Sleep Medicine)

## 2018-01-22 NOTE — Telephone Encounter (Signed)
I called pt. I advised pt that Dr. Rexene Alberts reviewed their sleep study results and found that pt pt did well during the latest sleep study with cpap. Dr. Rexene Alberts recommends that pt start a cpap at home. I reviewed PAP compliance expectations with the pt. Pt is agreeable to starting a CPAP. I advised pt that an order will be sent to a DME, [facility], and [facility] will call the pt within about one week after they file with the pt's insurance. [facility] will show the pt how to use the machine, fit for masks, and troubleshoot the CPAP if needed. A follow up appt was made for insurance purposes with Dr. Rexene Alberts on 04/16/18 at 1:00pm. Pt verbalized understanding to arrive 15 minutes early and bring their CPAP. A letter with all of this information in it will be mailed to the pt as a reminder. I verified with the pt that the address we have on file is correct. Pt asked that we mail her a copy of her sleep study results as well. Pt verbalized understanding of results. Pt had no questions at this time but was encouraged to call back if questions arise.

## 2018-01-28 LAB — CUP PACEART REMOTE DEVICE CHECK
Implantable Pulse Generator Implant Date: 20181106
MDC IDC SESS DTM: 20190204191207

## 2018-02-10 ENCOUNTER — Ambulatory Visit (INDEPENDENT_AMBULATORY_CARE_PROVIDER_SITE_OTHER): Payer: Medicare Other | Admitting: *Deleted

## 2018-02-10 DIAGNOSIS — I639 Cerebral infarction, unspecified: Secondary | ICD-10-CM

## 2018-02-10 LAB — CUP PACEART REMOTE DEVICE CHECK
MDC IDC PG IMPLANT DT: 20181106
MDC IDC SESS DTM: 20190309193634

## 2018-02-10 NOTE — Progress Notes (Signed)
Carelink Summary Report / Loop Recorder 

## 2018-02-13 DIAGNOSIS — I10 Essential (primary) hypertension: Secondary | ICD-10-CM | POA: Diagnosis not present

## 2018-02-13 DIAGNOSIS — E213 Hyperparathyroidism, unspecified: Secondary | ICD-10-CM | POA: Diagnosis not present

## 2018-02-13 DIAGNOSIS — G4733 Obstructive sleep apnea (adult) (pediatric): Secondary | ICD-10-CM | POA: Diagnosis not present

## 2018-02-13 DIAGNOSIS — R7303 Prediabetes: Secondary | ICD-10-CM | POA: Diagnosis not present

## 2018-02-13 DIAGNOSIS — E785 Hyperlipidemia, unspecified: Secondary | ICD-10-CM | POA: Diagnosis not present

## 2018-02-13 DIAGNOSIS — I63442 Cerebral infarction due to embolism of left cerebellar artery: Secondary | ICD-10-CM | POA: Diagnosis not present

## 2018-02-13 DIAGNOSIS — Z720 Tobacco use: Secondary | ICD-10-CM | POA: Diagnosis not present

## 2018-02-13 DIAGNOSIS — M170 Bilateral primary osteoarthritis of knee: Secondary | ICD-10-CM | POA: Diagnosis not present

## 2018-02-17 ENCOUNTER — Ambulatory Visit (INDEPENDENT_AMBULATORY_CARE_PROVIDER_SITE_OTHER): Payer: Medicare Other | Admitting: Diagnostic Neuroimaging

## 2018-02-17 ENCOUNTER — Encounter: Payer: Self-pay | Admitting: Diagnostic Neuroimaging

## 2018-02-17 VITALS — BP 127/88 | HR 81 | Ht 65.5 in | Wt 315.4 lb

## 2018-02-17 DIAGNOSIS — I639 Cerebral infarction, unspecified: Secondary | ICD-10-CM | POA: Diagnosis not present

## 2018-02-17 NOTE — Progress Notes (Signed)
[facility]  PATIENT: Kathleen [name] DOB: 06-13-1961  REFERRING CLINICIAN: Barton Dubois HISTORY FROM: patient and chart review  REASON FOR VISIT: follow up    HISTORICAL  CHIEF COMPLAINT:  Chief Complaint  Patient presents with  . Follow-up  . Cerebrovascular Accident    doing ok, has to pick up cpap at [facility].  Trouble sleeping (asking about trazadone (got thru pcp originallly    HISTORY OF PRESENT ILLNESS:   UPDATE (02/17/18, VRP): Since last visit, doing well. Tolerating meds. No alleviating or aggravating factors. Planning to start CPAP soon.   PRIOR HPI (08/13/17): 57 year old female here for evaluation of stroke hospital discharge follow-up. Patient has history of hypertension, anxiety, obesity.  05/14/17 patient had onset of headache, dizziness, slurred speech. 05/16/17, patient saw PCP. 05/17/17 patient presented to the emergency room. CT scan of the head demonstrated subacute left cerebellar stroke. This was confirmed on MRI. Patient had stroke workup completed.  Since that time patient continues to have constellation of symptoms including memory loss, forgetfulness, sleep problems, weight gain, shortness of breath, joint pain, racing thoughts.  Patient is looking to establish with a new primary care physician.  Patient is tolerating her medications including aspirin, atorvastatin, valsartan.   REVIEW OF SYSTEMS: Full 14 system review of systems performed and negative with exception of: As per history of present illness.  ALLERGIES: Allergies  Allergen Reactions  . Hydrocodone Itching  . Lisinopril Cough  . Tramadol Itching    HOME MEDICATIONS: Outpatient Medications Prior to Visit  Medication Sig Dispense Refill  . aspirin 325 MG tablet Take 1 tablet (325 mg total) by mouth daily. 90 tablet 3  . atorvastatin (LIPITOR) 40 MG tablet Take 1 tablet (40 mg total) by mouth daily at 6 PM. 30 tablet 0  . Cholecalciferol (VITAMIN D PO) Take by  mouth. 5000 u daily    . cyclobenzaprine (FLEXERIL) 10 MG tablet Take 10 mg by mouth at bedtime as needed for muscle spasms.    . hydrochlorothiazide (HYDRODIURIL) 25 MG tablet Take 0.5 tablets (12.5 mg total) by mouth daily. 30 tablet 0  . losartan (COZAAR) 100 MG tablet Take 1 tablet (100 mg total) by mouth daily. (Patient taking differently: Take 50 mg by mouth daily. ) 30 tablet 0  . meloxicam (MOBIC) 7.5 MG tablet Take 1 tablet (7.5 mg total) by mouth daily. (Patient taking differently: Take 7.5 mg by mouth daily as needed for pain. ) 30 tablet 2  . traZODone (DESYREL) 50 MG tablet Take 0.5-1 tablets (25-50 mg total) by mouth at bedtime as needed for sleep. (Patient not taking: Reported on 02/17/2018) 30 tablet 0  . albuterol (PROVENTIL HFA;VENTOLIN HFA) 108 (90 BASE) MCG/ACT inhaler Inhale 2 puffs into the lungs every 4 (four) hours as needed for wheezing or shortness of breath. 1 Inhaler 0  . ALPRAZolam (XANAX) 0.5 MG tablet Take 1 tablet (0.5 mg total) by mouth as needed for anxiety (for sedation before MRI scan; take 1 hour before scan; may repeat 15 min before scan). 3 tablet 0  . Vitamin D, Ergocalciferol, (DRISDOL) 50000 units CAPS capsule Take 50,000 Units by mouth once a week.   2   No facility-administered medications prior to visit.     PAST MEDICAL HISTORY: Past Medical History:  Diagnosis Date  . Anxiety   . Asthma   . Hypertension    NO MED MD TO GIVE RX  . Obesity   . Stroke [facility]) 05/2017    PAST SURGICAL  HISTORY: Past Surgical History:  Procedure Laterality Date  . CARPAL TUNNEL RELEASE  01/12/2012   Procedure: CARPAL TUNNEL RELEASE;  Surgeon: Schuyler Amor, MD;  Location: [facility];  Service: Orthopedics;  Laterality: Right;  . CESAREAN SECTION    . LOOP RECORDER INSERTION N/A 10/08/2017   Procedure: LOOP RECORDER INSERTION;  Surgeon: Thompson Grayer, MD;  Location: [place] [facility];  Service: Cardiovascular;  Laterality: N/A;  . NOSE SURGERY     BROKEN   MANY YRS  AGO   . TEE WITHOUT CARDIOVERSION N/A 10/08/2017   Procedure: TRANSESOPHAGEAL ECHOCARDIOGRAM (TEE);  Surgeon: Sanda Klein, MD;  Location: [facility];  Service: Cardiovascular;  Laterality: N/A;  . TUBAL LIGATION    . WRIST FRACTURE SURGERY  Feb 2013    FAMILY HISTORY: Family History  Problem Relation Age of Onset  . Hypertension Father   . Diabetes Father     SOCIAL HISTORY:  Social History   Socioeconomic History  . Marital status: Widowed    Spouse name: Not on file  . Number of children: Not on file  . Years of education: Not on file  . Highest education level: Not on file  Social Needs  . Financial resource strain: Not on file  . Food insecurity - worry: Not on file  . Food insecurity - inability: Not on file  . Transportation needs - medical: Not on file  . Transportation needs - non-medical: Not on file  Occupational History  . Not on file  Tobacco Use  . Smoking status: Former Smoker    Last attempt to quit: 05/17/2017    Years since quitting: 0.7  . Smokeless tobacco: Never Used  . Tobacco comment: Smoking about 6 cigs/week  Substance and Sexual Activity  . Alcohol use: Yes    Alcohol/week: 12.6 oz    Types: 21 Glasses of wine per week    Comment: DAILY ALCOHOL and WINE  . Drug use: No  . Sexual activity: Yes    Birth control/protection: None  Other Topics Concern  . Not on file  Social History Narrative   Lives home with S.O. Vivien Presto.  Education level 12th grade.    Drinks coffee.  Has one child.      PHYSICAL EXAM  GENERAL EXAM/CONSTITUTIONAL: Vitals:  Vitals:   02/17/18 1400  BP: 127/88  Pulse: 81  Weight: (!) 315 lb 6.4 oz (143.1 kg)  Height: 5' 5.5" (1.664 m)   Wt Readings from Last 3 Encounters:  02/17/18 (!) 315 lb 6.4 oz (143.1 kg)  11/07/17 (!) 306 lb (138.8 kg)  10/29/17 (!) 306 lb (138.8 kg)   Body mass index is 51.69 kg/m. No exam data present  Patient is in no distress; well developed, nourished and groomed; neck  is supple  CARDIOVASCULAR:  Examination of carotid arteries is normal; no carotid bruits  Regular rate and rhythm, no murmurs  Examination of peripheral vascular system by observation and palpation is normal  EYES:  Ophthalmoscopic exam of optic discs and posterior segments is normal; no papilledema or hemorrhages  MUSCULOSKELETAL:  Gait, strength, tone, movements noted in Neurologic exam below  NEUROLOGIC: MENTAL STATUS:  No flowsheet data found.  awake, alert, oriented to person, place and time  recent and remote memory intact  normal attention and concentration  language fluent, comprehension intact, naming intact,   fund of knowledge appropriate  CRANIAL NERVE:   2nd - no papilledema on fundoscopic exam  2nd, 3rd, 4th, 6th - pupils equal and  reactive to light, visual fields full to confrontation, extraocular muscles intact, no nystagmus  5th - facial sensation symmetric  7th - facial strength symmetric  8th - hearing intact  9th - palate elevates symmetrically, uvula midline  11th - shoulder shrug symmetric  12th - tongue protrusion midline  MOTOR:   normal bulk and tone, full strength in the BUE, BLE  SENSORY:   normal and symmetric to light touch, temperature, vibration  COORDINATION:   finger-nose-finger, fine finger movements normal  REFLEXES:   deep tendon reflexes TRACE and symmetric  GAIT/STATION:   narrow based gait; SLOW GAIT    DIAGNOSTIC DATA (LABS, IMAGING, TESTING) - I reviewed patient records, labs, notes, testing and imaging myself where available.  Lab Results  Component Value Date   WBC 8.8 06/19/2017   HGB 12.3 06/19/2017   HCT 38.5 06/19/2017   MCV 84 06/19/2017   PLT 228 06/19/2017      Component Value Date/Time   NA 142 06/19/2017 1157   K 3.8 06/19/2017 1157   CL 102 06/19/2017 1157   CO2 27 06/19/2017 1157   GLUCOSE 91 06/19/2017 1157   GLUCOSE 103 (H) 05/19/2017 0450   BUN 15 06/19/2017 1157    CREATININE 0.72 06/19/2017 1157   CREATININE 0.85 12/28/2015 1435   CALCIUM 11.5 (H) 06/19/2017 1157   PROT 7.0 06/19/2017 1157   ALBUMIN 4.1 06/19/2017 1157   AST 18 06/19/2017 1157   ALT 19 06/19/2017 1157   ALKPHOS 105 06/19/2017 1157   BILITOT 0.3 06/19/2017 1157   GFRNONAA 95 06/19/2017 1157   GFRNONAA 79 07/29/2015 0935   GFRAA 110 06/19/2017 1157   GFRAA >89 07/29/2015 0935   Lab Results  Component Value Date   CHOL 141 06/19/2017   HDL 58 06/19/2017   LDLCALC 70 06/19/2017   TRIG 66 06/19/2017   CHOLHDL 2.4 06/19/2017   Lab Results  Component Value Date   HGBA1C 5.6 05/18/2017   No results found for: [id number] Lab Results  Component Value Date   TSH 1.950 06/19/2017    05/18/17 TTE - Left ventricle: The cavity size was normal. Wall thickness was   increased in a pattern of moderate LVH. Systolic function was   vigorous. The estimated ejection fraction was in the range of 65%   to 70%. Wall motion was normal; there were no regional wall   motion abnormalities. Doppler parameters are consistent with   abnormal left ventricular relaxation (grade 1 diastolic   dysfunction). Doppler parameters are consistent with high   ventricular filling pressure. - Aortic valve: Valve area (VTI): 3.08 cm^2. Valve area (Vmax):   2.45 cm^2. Valve area (Vmean): 2.44 cm^2. - Atrial septum: No defect or patent foramen ovale was identified.  05/18/17 carotid u/s - Bilateral: mild mixed plaque origin ICA. 1-39% ICA plaquing. Vertebral artery flow is antegrade.  05/23/17 cardiac monitor (30 day) Sinus rhythm Occasional nocturnal sinus bradycardia is observed No atrial fibrillation is observed though baseline artifact limits interpretation at times No AV block or pauses No sustained arrhythmias  06/28/17 BLE u/s - Technically difficult due to body habitus. - No evidence of deep vein or superficial thrombosis involving the   right lower extremity and left common femoral vein. - No  evidence of Baker&'s cyst on the right.  09/27/17 MRA neck  1.    There is 10% stenosis of the internal carotid artery on the right at the origin that is not hemodynamically significant.  2.    The origins  of the vertebral arteries are not visualized but this is more likely to be artifact than due to reduced flow. 3.    The left common carotid artery arises from   10/08/17 TEE - Left ventricle: The cavity size was normal. There was mild   concentric hypertrophy. Systolic function was normal. The   estimated ejection fraction was in the range of 60% to 65%. Wall   motion was normal; there were no regional wall motion   abnormalities. - Left atrium: The atrium was mildly dilated. No evidence of   thrombus in the atrial cavity or appendage. There was   moderatecontinuous spontaneous echo contrast (&quot;smoke&quot;) in the   cavity. - Atrial septum: No defect or patent foramen ovale was identified.   Echo contrast study showed no right-to-left atrial level shunt,   at baseline or with provocation. - Tricuspid valve: No evidence of vegetation. There was   mild-moderate regurgitation. - Pulmonic valve: No evidence of vegetation.  10/08/17 loop recorder  1. Successful implantation of a Medtronic Reveal LINQ implantable loop recorder for cryptogenic stroke  2. No early apparent complications.      ASSESSMENT AND PLAN  57 y.o. year old female here with left cerebellar ischemic infarction, embolic appearance, with unknown source.   Dx:  1. Cerebellar stroke (HCC)      PLAN:  STROKE PREVENTION (embolic, left cerebellar, unknown source) - continue aspirin, statin, BP control - follow up loop recorder monitoring - follow up CPAP for OSA - no neurologic contraindication for anticipated bariatric surgery (gastric sleeve per patient)  MIND RACING / ANXIETY / INSOMNIA - consider sertraline 25mg  daily - consider psychology evaluation - follow up with PCP  OBESITY  - nutrition and  fitness strategies reviewed  Return if symptoms worsen or fail to improve, for return to PCP.     Penni Bombard, MD 0/71/2197, 5:88 PM Certified in Neurology, Neurophysiology and Neuroimaging  [facility] [address], [place], [place] [address] [phone]

## 2018-03-12 ENCOUNTER — Telehealth: Payer: Self-pay | Admitting: Cardiology

## 2018-03-12 NOTE — Telephone Encounter (Signed)
Attempted to call pt b/c her home monitor has not updated in at least 14 days. No answer and unable to leave a message.  

## 2018-03-13 ENCOUNTER — Ambulatory Visit (INDEPENDENT_AMBULATORY_CARE_PROVIDER_SITE_OTHER): Payer: Medicare HMO | Admitting: *Deleted

## 2018-03-13 DIAGNOSIS — I639 Cerebral infarction, unspecified: Secondary | ICD-10-CM

## 2018-03-17 NOTE — Progress Notes (Signed)
Carelink Summary Report / Loop Recorder 

## 2018-03-21 ENCOUNTER — Encounter: Payer: Self-pay | Admitting: Neurology

## 2018-03-23 DIAGNOSIS — G4733 Obstructive sleep apnea (adult) (pediatric): Secondary | ICD-10-CM | POA: Diagnosis not present

## 2018-03-26 ENCOUNTER — Telehealth: Payer: Self-pay | Admitting: Cardiology

## 2018-03-26 NOTE — Telephone Encounter (Signed)
Attempted to call pt b/c her home monitor has not updated in at least 14 days. No answer and unable to leave a message.  

## 2018-04-10 DIAGNOSIS — E213 Hyperparathyroidism, unspecified: Secondary | ICD-10-CM | POA: Diagnosis not present

## 2018-04-10 DIAGNOSIS — M170 Bilateral primary osteoarthritis of knee: Secondary | ICD-10-CM | POA: Diagnosis not present

## 2018-04-10 DIAGNOSIS — I63442 Cerebral infarction due to embolism of left cerebellar artery: Secondary | ICD-10-CM | POA: Diagnosis not present

## 2018-04-10 DIAGNOSIS — G4733 Obstructive sleep apnea (adult) (pediatric): Secondary | ICD-10-CM | POA: Diagnosis not present

## 2018-04-10 DIAGNOSIS — R7303 Prediabetes: Secondary | ICD-10-CM | POA: Diagnosis not present

## 2018-04-10 DIAGNOSIS — I10 Essential (primary) hypertension: Secondary | ICD-10-CM | POA: Diagnosis not present

## 2018-04-10 DIAGNOSIS — Z720 Tobacco use: Secondary | ICD-10-CM | POA: Diagnosis not present

## 2018-04-10 DIAGNOSIS — E785 Hyperlipidemia, unspecified: Secondary | ICD-10-CM | POA: Diagnosis not present

## 2018-04-14 ENCOUNTER — Encounter: Payer: Self-pay | Admitting: Neurology

## 2018-04-15 ENCOUNTER — Ambulatory Visit (INDEPENDENT_AMBULATORY_CARE_PROVIDER_SITE_OTHER): Payer: Medicare HMO | Admitting: *Deleted

## 2018-04-15 DIAGNOSIS — I639 Cerebral infarction, unspecified: Secondary | ICD-10-CM

## 2018-04-16 ENCOUNTER — Encounter: Payer: Self-pay | Admitting: Neurology

## 2018-04-16 ENCOUNTER — Ambulatory Visit (INDEPENDENT_AMBULATORY_CARE_PROVIDER_SITE_OTHER): Payer: Medicare HMO | Admitting: Neurology

## 2018-04-16 VITALS — BP 173/113 | HR 69 | Ht 66.0 in | Wt 314.0 lb

## 2018-04-16 DIAGNOSIS — G4733 Obstructive sleep apnea (adult) (pediatric): Secondary | ICD-10-CM

## 2018-04-16 DIAGNOSIS — Z9989 Dependence on other enabling machines and devices: Secondary | ICD-10-CM

## 2018-04-16 LAB — CUP PACEART REMOTE DEVICE CHECK
Date Time Interrogation Session: 20190411200614
Implantable Pulse Generator Implant Date: 20181106

## 2018-04-16 NOTE — Progress Notes (Signed)
Subjective:    Patient ID: Kathleen [name] is a 57 y.o. female.  HPI     Interim history:   Kathleen [name] ([name]) is a 57 year old right-handed woman with an underlying medical history of HTN, cerebellar stroke in June 2018, hyperlipidemia, vitamin D deficiency and morbid obesity, who presents for follow-up consultation of her obstructive sleep apnea, after sleep study testing. The patient is unaccompanied today. I first met her on 10/02/2017 at the request of Dr. Leta Baptist, at which time she reported snoring and excessive daytime somnolence. I suggested we proceed with sleep study testing. She had a baseline sleep study, followed by a CPAP titration study. I went over her test results with her in detail today. Baseline sleep study from 10/31/2017 showed a sleep latency of 38 minutes, REM latency was delayed at 240 minutes, sleep efficiency reduced at 66.2%. She had an increased percentage of stage I sleep, and a significantly reduced percentage of REM sleep. Total AHI was 60 per hour, REM AHI was 87 per hour, supine AHI was 100.2 per hour. Average oxygen saturation was 93%, nadir was 71%. Based on her test results I suggested we proceed with a CPAP titration study. She had this on 01/21/2018. Sleep efficiency was 94.4%, sleep latency was 6.5 minutes REM latency 126.5 minutes. She had an increased percentage of slow-wave sleep and REM sleep. She was fitted with nasal pillows and CPAP was initiated at 6 cm and advanced 12 cm. At a pressure of 12 cm her AHI was reduced to 0 per hour with supine REM sleep achieved an O2 nadir of 91%. Based on her test results I prescribed CPAP therapy of 12 cm for home use.  Today, 04/16/2018: I reviewed her CPAP compliance data from the last 30 days but she is no longer using her CPAP, left a registered wears 03/22/2018. She was very consistent with her CPAP usage in the beginning, in the month of March through mid April she had a compliance percentage of 70%, from  02/20/2018 through 03/21/2018 she used her CPAP 24 out of 30 days with a percent used days greater than 4 hours) 70%, indicating compliance. Average usage was 6 hours and 56 minutes, residual AHI at goal at 0.8 per hour, leak in the low/acceptable range with the 95th percentile at 8.7 L/m on a pressure of 12 cm with EPR of 3. She reports that she feels she needs a new mask. She has not called her DME company. She had interim weight gain, but stable since last visit with Dr. Leta Baptist in March 2019. Of note, she had some interim weight gain. She interim loop recorder insertion in November, after having a TEE. She admits that she has not used her CPAP in the past couple weeks. She reports that she felt she needed a new mask. When she first started using CPAP and was consistent with the usage, she did notice an improvement in her daytime somnolence and sleep quality.  Previously:   10/02/2017: (She) reports snoring and excessive daytime somnolence. I reviewed your office note from 08/13/2017. Her Epworth sleepiness score is 4 out of 24 today, fatigue score is 56 out of 63. She had a neck MRA on 09/29/17, we talked about the results, she is scheduled for TEE on 10/08/17. She is anxious about it. She has bilateral knee pain. She quit smoking after the stroke but admits that she smokes about 1 per week currently. She does have a history of drinking wine every day but reports  that she does not drink daily currently. She does not drink caffeine daily. She lives with her husband. She has one son and one biological grandchild, altogether 42 grandchildren with her husband's grandchildren. She watches TV in bed and turns it up before falling asleep. She is usually in bed by 7:30 or 8, wakeup time is around 5 or 5:30. She has nocturia about twice per average night and denies morning headaches. Her husband sleeps in a separate bedroom. She has been told that she stops breathing in her sleep however. She's not aware of any  family history of OSA.  Her Past Medical History Is Significant For: Past Medical History:  Diagnosis Date  . Anxiety   . Asthma   . Hypertension    NO MED MD TO GIVE RX  . Obesity   . Stroke [facility]) 05/2017    Her Past Surgical History Is Significant For: Past Surgical History:  Procedure Laterality Date  . CARPAL TUNNEL RELEASE  01/12/2012   Procedure: CARPAL TUNNEL RELEASE;  Surgeon: Schuyler Amor, MD;  Location: [place];  Service: Orthopedics;  Laterality: Right;  . CESAREAN SECTION    . LOOP RECORDER INSERTION N/A 10/08/2017   Procedure: LOOP RECORDER INSERTION;  Surgeon: Thompson Grayer, MD;  Location: [place] [facility];  Service: Cardiovascular;  Laterality: N/A;  . NOSE SURGERY     BROKEN   MANY YRS AGO   . TEE WITHOUT CARDIOVERSION N/A 10/08/2017   Procedure: TRANSESOPHAGEAL ECHOCARDIOGRAM (TEE);  Surgeon: Sanda Klein, MD;  Location: [place];  Service: Cardiovascular;  Laterality: N/A;  . TUBAL LIGATION    . WRIST FRACTURE SURGERY  Feb 2013    Her Family History Is Significant For: Family History  Problem Relation Age of Onset  . Hypertension Father   . Diabetes Father     Her Social History Is Significant For: Social History   Socioeconomic History  . Marital status: Widowed    Spouse name: Not on file  . Number of children: Not on file  . Years of education: Not on file  . Highest education level: Not on file  Occupational History  . Not on file  Social Needs  . Financial resource strain: Not on file  . Food insecurity:    Worry: Not on file    Inability: Not on file  . Transportation needs:    Medical: Not on file    Non-medical: Not on file  Tobacco Use  . Smoking status: Former Smoker    Last attempt to quit: 05/17/2017    Years since quitting: 0.9  . Smokeless tobacco: Never Used  . Tobacco comment: Smoking about 6 cigs/week  Substance and Sexual Activity  . Alcohol use: Yes    Alcohol/week: 12.6 oz    Types: 21 Glasses of wine per  week    Comment: DAILY ALCOHOL and WINE  . Drug use: No  . Sexual activity: Yes    Birth control/protection: None  Lifestyle  . Physical activity:    Days per week: Not on file    Minutes per session: Not on file  . Stress: Not on file  Relationships  . Social connections:    Talks on phone: Not on file    Gets together: Not on file    Attends religious service: Not on file    Active member of club or organization: Not on file    Attends meetings of clubs or organizations: Not on file    Relationship status: Not on  file  Other Topics Concern  . Not on file  Social History Narrative   Lives home with S.O. Vivien Presto.  Education level 12th grade.    Drinks coffee.  Has one child.     Her Allergies Are:  Allergies  Allergen Reactions  . Hydrocodone Itching  . Lisinopril Cough  . Tramadol Itching  :   Her Current Medications Are:  Outpatient Encounter Medications as of 04/16/2018  Medication Sig  . aspirin 325 MG tablet Take 1 tablet (325 mg total) by mouth daily.  Marland Kitchen atorvastatin (LIPITOR) 40 MG tablet Take 1 tablet (40 mg total) by mouth daily at 6 PM.  . Cholecalciferol (VITAMIN D PO) Take by mouth. 5000 u daily  . cyclobenzaprine (FLEXERIL) 10 MG tablet Take 10 mg by mouth at bedtime as needed for muscle spasms.  . hydrochlorothiazide (HYDRODIURIL) 25 MG tablet Take 0.5 tablets (12.5 mg total) by mouth daily.  Marland Kitchen losartan (COZAAR) 100 MG tablet Take 1 tablet (100 mg total) by mouth daily. (Patient taking differently: Take 50 mg by mouth daily. )  . meloxicam (MOBIC) 7.5 MG tablet Take 1 tablet (7.5 mg total) by mouth daily. (Patient taking differently: Take 7.5 mg by mouth daily as needed for pain. )  . [DISCONTINUED] traZODone (DESYREL) 50 MG tablet Take 0.5-1 tablets (25-50 mg total) by mouth at bedtime as needed for sleep. (Patient not taking: Reported on 02/17/2018)   No facility-administered encounter medications on file as of 04/16/2018.   :  Review of Systems:   Out of a complete 14 point review of systems, all are reviewed and negative with the exception of these symptoms as listed below:  Review of Systems  Neurological:       Pt presents today to discuss her cpap. Pt has struggled the past 2 weeks because she needs a new mask.    Objective:  Neurological Exam  Physical Exam Physical Examination:   Vitals:   04/16/18 1302  BP: (!) 173/113  Pulse: 69   General Examination: The patient is a very pleasant 57 y.o. female in no acute distress. She appears well-developed and well-nourished and well groomed. Upon recheck, her blood pressure was 140/90.  HEENT: Normocephalic, atraumatic, pupils are equal, round and reactive to light and accommodation. Extraocular tracking is good without limitation to gaze excursion or nystagmus noted. Normal smooth pursuit is noted. Hearing is grossly intact. Face is symmetric with normal facial animation. Speech is clear with no dysarthria noted. There is no hypophonia. There is no lip, neck/head, jaw or voice tremor. Neck is supple with full range of active motion. Oropharynx exam reveals: moderated mouth dryness, adequate dental hygiene with full dentures and moderate airway crowding.   Chest: Clear to auscultation without wheezing, rhonchi or crackles noted.  Heart: S1+S2+0, regular and normal without murmurs, rubs or gallops noted.   Abdomen: Soft, non-tender and non-distended with normal bowel sounds appreciated on auscultation.  Extremities: There is no pitting edema in the distal lower extremities bilaterally. Pedal pulses are intact.  Skin: Warm and dry without trophic changes noted.  Musculoskeletal: exam reveals bilateral knee pain, right more than left.    Neurologically:  Mental status: The patient is awake, alert and oriented in all 4 spheres. Her immediate and remote memory, attention, language skills and fund of knowledge are appropriate. There is no evidence of aphasia, agnosia,  apraxia or anomia. Speech is clear with normal prosody and enunciation. Thought process is linear. Mood is normal and affect is normal.  Cranial nerves II - XII are as described above under HEENT exam. In addition: shoulder shrug is normal with equal shoulder height noted. Motor exam: Normal bulk, strength and tone is noted. There is no drift, tremor or rebound. Romberg is not tested for safety, reflexes are 1+ in the upper extremities, knees or not tested due to pain reported. Fine motor skills are grossly intact. She has no intention tremor, heel-to-shin is not possible for her due to knee pain.  Cerebellar testing: No dysmetria or intention tremor  in the upper extremities. There is no truncal or gait ataxia.  Sensory exam: intact to light touch in the upper and lower extremities.  Gait, station and balance: She stands with difficulty due to knee pain. She has a limp on the right. She has no obvious cerebellar ataxia otherwise. Tandem walk is not tested due to fall risk.  Assessment and Plan:   In summary, ANNSLEY AKKERMAN is a very pleasant 57 year old female with an underlying medical history of HTN, cerebellar stroke in June 2018, hyperlipidemia, vitamin D deficiency and morbid obesity with a BMI of over 45, who presents for follow-up consultation of her severe obstructive sleep apnea as determined by her baseline sleep study in November 2018. She then had a CPAP titration study in February 2019 with significant improvement in her sleep disorder breathing and her sleep architecture. She was advised regarding her study results and we compared findings. She also is advised to get back on track with CPAP usage. She was compliant in the beginning, had a 70% compliance in the month of late March through late April. She is motivated to continue with treatment and is going to call her DME company for supplies. She is advised to follow-up in sleep clinic in about 6 months, she can see one of our nurse  practitioners at the time. We talked about the importance of staying compliant with CPAP therapy for severe sleep apnea to reduce her cardiovascular risks. We talked about the importance of losing weight. I answered all her questions today and she was in agreement. I spent 25 minutes in total face-to-face time with the patient, more than 50% of which was spent in counseling and coordination of care, reviewing test results, reviewing medication and discussing or reviewing the diagnosis of OSA, its prognosis and treatment options. Pertinent laboratory and imaging test results that were available during this visit with the patient were reviewed by me and considered in my medical decision making (see chart for details).

## 2018-04-16 NOTE — Progress Notes (Signed)
Carelink Summary Report / Loop Recorder 

## 2018-04-16 NOTE — Patient Instructions (Signed)
You had done very well in the beginning with CPAP. Please be sure to change your filter every 1-2 months, your mask about every 2-3 months, hose about 6 months, humidifier chamber about yearly. Some restrictions are imposed by her insurance carrier with regard to how frequently you can get certain supplies.   Please get your new supplies soon.  Please continue using your CPAP regularly. While your insurance requires that you use CPAP at least 4 hours each night on 70% of the nights, I recommend, that you not skip any nights and use it throughout the night if you can. Getting used to CPAP and staying with the treatment long term does take time and patience and discipline. Untreated obstructive sleep apnea when it is moderate to severe can have an adverse impact on cardiovascular health and raise her risk for heart disease, arrhythmias, hypertension, congestive heart failure, stroke and diabetes. Untreated obstructive sleep apnea causes sleep disruption, nonrestorative sleep, and sleep deprivation. This can have an impact on your day to day functioning and cause daytime sleepiness and impairment of cognitive function, memory loss, mood disturbance, and problems focussing. Using CPAP regularly can improve these symptoms.  We will see you back in 6 months for sleep apnea check up, please see one our nurse practitioners.

## 2018-04-22 DIAGNOSIS — G4733 Obstructive sleep apnea (adult) (pediatric): Secondary | ICD-10-CM | POA: Diagnosis not present

## 2018-05-01 DIAGNOSIS — Z6841 Body Mass Index (BMI) 40.0 and over, adult: Secondary | ICD-10-CM | POA: Diagnosis not present

## 2018-05-01 DIAGNOSIS — Z124 Encounter for screening for malignant neoplasm of cervix: Secondary | ICD-10-CM | POA: Diagnosis not present

## 2018-05-01 DIAGNOSIS — N950 Postmenopausal bleeding: Secondary | ICD-10-CM | POA: Diagnosis not present

## 2018-05-08 DIAGNOSIS — N950 Postmenopausal bleeding: Secondary | ICD-10-CM | POA: Diagnosis not present

## 2018-05-09 LAB — CUP PACEART REMOTE DEVICE CHECK
Date Time Interrogation Session: 20190514203800
MDC IDC PG IMPLANT DT: 20181106

## 2018-05-19 ENCOUNTER — Ambulatory Visit (INDEPENDENT_AMBULATORY_CARE_PROVIDER_SITE_OTHER): Payer: Medicare HMO | Admitting: *Deleted

## 2018-05-19 DIAGNOSIS — I639 Cerebral infarction, unspecified: Secondary | ICD-10-CM | POA: Diagnosis not present

## 2018-05-19 NOTE — Progress Notes (Signed)
Carelink Summary Report / Loop Recorder 

## 2018-05-23 DIAGNOSIS — G4733 Obstructive sleep apnea (adult) (pediatric): Secondary | ICD-10-CM | POA: Diagnosis not present

## 2018-05-28 ENCOUNTER — Telehealth: Payer: Self-pay

## 2018-05-28 NOTE — Telephone Encounter (Signed)
Attempted to confirm remote transmission with pt. No answer and was unable to leave a message.   

## 2018-06-02 ENCOUNTER — Encounter: Payer: Self-pay | Admitting: Cardiology

## 2018-06-20 ENCOUNTER — Ambulatory Visit (INDEPENDENT_AMBULATORY_CARE_PROVIDER_SITE_OTHER): Payer: Medicare HMO | Admitting: *Deleted

## 2018-06-20 DIAGNOSIS — I639 Cerebral infarction, unspecified: Secondary | ICD-10-CM | POA: Diagnosis not present

## 2018-06-21 LAB — CUP PACEART REMOTE DEVICE CHECK
Implantable Pulse Generator Implant Date: 20181106
MDC IDC SESS DTM: 20190616213555

## 2018-06-22 DIAGNOSIS — G4733 Obstructive sleep apnea (adult) (pediatric): Secondary | ICD-10-CM | POA: Diagnosis not present

## 2018-06-23 NOTE — Progress Notes (Signed)
Carelink Summary Report / Loop Recorder 

## 2018-06-24 ENCOUNTER — Ambulatory Visit (INDEPENDENT_AMBULATORY_CARE_PROVIDER_SITE_OTHER): Payer: Medicare HMO | Admitting: Orthopaedic Surgery

## 2018-06-24 ENCOUNTER — Telehealth: Payer: Self-pay | Admitting: Cardiology

## 2018-06-24 NOTE — Telephone Encounter (Signed)
Attempted to call pt b/c her home monitor has not updated in at least 14 days. No answer and unable to leave a message.  

## 2018-07-10 DIAGNOSIS — N72 Inflammatory disease of cervix uteri: Secondary | ICD-10-CM | POA: Diagnosis not present

## 2018-07-10 DIAGNOSIS — N950 Postmenopausal bleeding: Secondary | ICD-10-CM | POA: Diagnosis not present

## 2018-07-10 DIAGNOSIS — N840 Polyp of corpus uteri: Secondary | ICD-10-CM | POA: Diagnosis not present

## 2018-07-15 ENCOUNTER — Encounter: Payer: Self-pay | Admitting: Cardiology

## 2018-07-23 ENCOUNTER — Ambulatory Visit (INDEPENDENT_AMBULATORY_CARE_PROVIDER_SITE_OTHER): Payer: Medicare HMO | Admitting: *Deleted

## 2018-07-23 DIAGNOSIS — I639 Cerebral infarction, unspecified: Secondary | ICD-10-CM

## 2018-07-23 DIAGNOSIS — G4733 Obstructive sleep apnea (adult) (pediatric): Secondary | ICD-10-CM | POA: Diagnosis not present

## 2018-07-24 NOTE — Progress Notes (Signed)
Carelink Summary Report / Loop Recorder 

## 2018-08-07 LAB — CUP PACEART REMOTE DEVICE CHECK
Date Time Interrogation Session: 20190719221039
MDC IDC PG IMPLANT DT: 20181106

## 2018-08-13 DIAGNOSIS — Z7982 Long term (current) use of aspirin: Secondary | ICD-10-CM | POA: Diagnosis not present

## 2018-08-13 DIAGNOSIS — Z833 Family history of diabetes mellitus: Secondary | ICD-10-CM | POA: Diagnosis not present

## 2018-08-13 DIAGNOSIS — I10 Essential (primary) hypertension: Secondary | ICD-10-CM | POA: Diagnosis not present

## 2018-08-13 DIAGNOSIS — E785 Hyperlipidemia, unspecified: Secondary | ICD-10-CM | POA: Diagnosis not present

## 2018-08-13 DIAGNOSIS — Z6841 Body Mass Index (BMI) 40.0 and over, adult: Secondary | ICD-10-CM | POA: Diagnosis not present

## 2018-08-13 DIAGNOSIS — G8929 Other chronic pain: Secondary | ICD-10-CM | POA: Diagnosis not present

## 2018-08-13 DIAGNOSIS — Z823 Family history of stroke: Secondary | ICD-10-CM | POA: Diagnosis not present

## 2018-08-13 DIAGNOSIS — Z809 Family history of malignant neoplasm, unspecified: Secondary | ICD-10-CM | POA: Diagnosis not present

## 2018-08-13 DIAGNOSIS — Z720 Tobacco use: Secondary | ICD-10-CM | POA: Diagnosis not present

## 2018-08-23 DIAGNOSIS — G4733 Obstructive sleep apnea (adult) (pediatric): Secondary | ICD-10-CM | POA: Diagnosis not present

## 2018-08-25 ENCOUNTER — Ambulatory Visit (INDEPENDENT_AMBULATORY_CARE_PROVIDER_SITE_OTHER): Payer: Medicare HMO | Admitting: *Deleted

## 2018-08-25 DIAGNOSIS — I639 Cerebral infarction, unspecified: Secondary | ICD-10-CM | POA: Diagnosis not present

## 2018-08-26 DIAGNOSIS — E213 Hyperparathyroidism, unspecified: Secondary | ICD-10-CM | POA: Diagnosis not present

## 2018-08-26 DIAGNOSIS — I63442 Cerebral infarction due to embolism of left cerebellar artery: Secondary | ICD-10-CM | POA: Diagnosis not present

## 2018-08-26 DIAGNOSIS — G4733 Obstructive sleep apnea (adult) (pediatric): Secondary | ICD-10-CM | POA: Diagnosis not present

## 2018-08-26 DIAGNOSIS — I10 Essential (primary) hypertension: Secondary | ICD-10-CM | POA: Diagnosis not present

## 2018-08-26 DIAGNOSIS — R7303 Prediabetes: Secondary | ICD-10-CM | POA: Diagnosis not present

## 2018-08-26 DIAGNOSIS — E785 Hyperlipidemia, unspecified: Secondary | ICD-10-CM | POA: Diagnosis not present

## 2018-08-26 DIAGNOSIS — M170 Bilateral primary osteoarthritis of knee: Secondary | ICD-10-CM | POA: Diagnosis not present

## 2018-08-26 DIAGNOSIS — Z720 Tobacco use: Secondary | ICD-10-CM | POA: Diagnosis not present

## 2018-08-26 LAB — CUP PACEART REMOTE DEVICE CHECK
Date Time Interrogation Session: 20190821220802
Implantable Pulse Generator Implant Date: 20181106

## 2018-08-26 NOTE — Progress Notes (Signed)
Carelink Summary Report / Loop Recorder 

## 2018-09-01 LAB — CUP PACEART REMOTE DEVICE CHECK
Implantable Pulse Generator Implant Date: 20181106
MDC IDC SESS DTM: 20190923223905

## 2018-09-22 DIAGNOSIS — G4733 Obstructive sleep apnea (adult) (pediatric): Secondary | ICD-10-CM | POA: Diagnosis not present

## 2018-09-24 ENCOUNTER — Telehealth: Payer: Self-pay

## 2018-09-24 NOTE — Telephone Encounter (Signed)
LMOVM requesting that pt send manual transmission b/c home monitor has not updated in at least 14 days.   Attempted to confirm remote transmission with pt. No answer and was unable to leave a message.

## 2018-09-29 ENCOUNTER — Encounter: Payer: Self-pay | Admitting: Cardiology

## 2018-09-29 ENCOUNTER — Ambulatory Visit (INDEPENDENT_AMBULATORY_CARE_PROVIDER_SITE_OTHER): Payer: Medicare HMO | Admitting: *Deleted

## 2018-09-29 DIAGNOSIS — I639 Cerebral infarction, unspecified: Secondary | ICD-10-CM | POA: Diagnosis not present

## 2018-09-29 DIAGNOSIS — I10 Essential (primary) hypertension: Secondary | ICD-10-CM

## 2018-09-30 NOTE — Progress Notes (Signed)
Carelink Summary Report / Loop Recorder 

## 2018-10-03 NOTE — Progress Notes (Addendum)
[facility]  PATIENT: Kathleen [name] DOB: 1961-08-24   REASON FOR VISIT: Follow-up for obstructive sleep apnea with CPAP, history of stroke HISTORY FROM: Patient    HISTORY OF PRESENT ILLNESS: Kathleen [name] (Kathleen [name]) is a 57 year old right-handed woman with an underlying medical history of HTN, cerebellar stroke in June 2018, hyperlipidemia, vitamin D deficiency and morbid obesity, who presents for follow-up consultation of her obstructive sleep apnea, after sleep study testing. The patient is unaccompanied today. I first met her on 10/02/2017 at the request of Dr. Leta [name], at which time she reported snoring and excessive daytime somnolence. I suggested we proceed with sleep study testing. She had a baseline sleep study, followed by a CPAP titration study. I went over her test results with her in detail today. Baseline sleep study from 10/31/2017 showed a sleep latency of 38 minutes, REM latency was delayed at 240 minutes, sleep efficiency reduced at 66.2%. She had an increased percentage of stage I sleep, and a significantly reduced percentage of REM sleep. Total AHI was 60 per hour, REM AHI was 87 per hour, supine AHI was 100.2 per hour. Average oxygen saturation was 93%, nadir was 71%. Based on her test results I suggested we proceed with a CPAP titration study. She had this on 01/21/2018. Sleep efficiency was 94.4%, sleep latency was 6.5 minutes REM latency 126.5 minutes. She had an increased percentage of slow-wave sleep and REM sleep. She was fitted with nasal pillows and CPAP was initiated at 6 cm and advanced 12 cm. At a pressure of 12 cm her AHI was reduced to 0 per hour with supine REM sleep achieved an O2 nadir of 91%. Based on her test results I prescribed CPAP therapy of 12 cm for home use.  Today, 04/16/2018: I reviewed her CPAP compliance data from the last 30 days but she is no longer using her CPAP, left a registered wears 03/22/2018. She was very consistent  with her CPAP usage in the beginning, in the month of March through mid April she had a compliance percentage of 70%, from 02/20/2018 through 03/21/2018 she used her CPAP 24 out of 30 days with a percent used days greater than 4 hours) 70%, indicating compliance. Average usage was 6 hours and 56 minutes, residual AHI at goal at 0.8 per hour, leak in the low/acceptable range with the 95th percentile at 8.7 L/m on a pressure of 12 cm with EPR of 3. She reports that she feels she needs a new mask. She has not called her DME company. She had interim weight gain, but stable since last visit with Dr. Leta [name] in March 2019. Of note, she had some interim weight gain. She interim loop recorder insertion in November, after having a TEE. She admits that she has not used her CPAP in the past couple weeks. She reports that she felt she needed a new mask. When she first started using CPAP and was consistent with the usage, she did notice an improvement in her daytime somnolence and sleep quality. UPDATE 11/4/2019CM Kathleen [name], 57 year old female returns for follow-up with history of obstructive sleep apnea which is severe.  She admits that she does not use her CPAP every night.  Compliance data dated 09/06/2018-10/05/2018 shows compliance greater than 4 hours at 20% or 6 days.  Average usage 7 hours 20 minutes.  Set pressure 12 cm EPR level 3 leak 95th percentile 11.6 AHI 0.3.  She says she needs a new mask but has not called her DME  company.  She has a history of stroke in June 2018.  She claims she is due to get routine labs with her primary care in the next week or so.  She returns for reevaluation REVIEW OF SYSTEMS: Full 14 system review of systems performed and notable only for those listed, all others are neg:  Constitutional: neg  Cardiovascular: neg Ear/Nose/Throat: neg  Skin: neg Eyes: neg Respiratory: neg Gastroitestinal: neg  Hematology/Lymphatic: neg  Endocrine: neg Musculoskeletal: Joint  pain Allergy/Immunology: neg Neurological: neg Psychiatric: neg Sleep : Obstructive sleep apnea with CPAP   ALLERGIES: Allergies  Allergen Reactions  . Hydrocodone Itching  . Lisinopril Cough  . Tramadol Itching    HOME MEDICATIONS: Outpatient Medications Prior to Visit  Medication Sig Dispense Refill  . aspirin 325 MG tablet Take 1 tablet (325 mg total) by mouth daily. 90 tablet 3  . atorvastatin (LIPITOR) 40 MG tablet Take 1 tablet (40 mg total) by mouth daily at 6 PM. 30 tablet 0  . Cholecalciferol (VITAMIN D PO) Take by mouth. 5000 u daily    . cyclobenzaprine (FLEXERIL) 10 MG tablet Take 10 mg by mouth at bedtime as needed for muscle spasms.    . hydrochlorothiazide (HYDRODIURIL) 25 MG tablet Take 0.5 tablets (12.5 mg total) by mouth daily. 30 tablet 0  . losartan (COZAAR) 100 MG tablet Take 1 tablet (100 mg total) by mouth daily. (Patient taking differently: Take 50 mg by mouth daily. ) 30 tablet 0  . meloxicam (MOBIC) 7.5 MG tablet Take 1 tablet (7.5 mg total) by mouth daily. (Patient taking differently: Take 7.5 mg by mouth daily as needed for pain. ) 30 tablet 2   No facility-administered medications prior to visit.     PAST MEDICAL HISTORY: Past Medical History:  Diagnosis Date  . Anxiety   . Asthma   . Hypertension    NO MED MD TO GIVE RX  . Obesity   . Stroke [facility]) 05/2017    PAST SURGICAL HISTORY: Past Surgical History:  Procedure Laterality Date  . CARPAL TUNNEL RELEASE  01/12/2012   Procedure: CARPAL TUNNEL RELEASE;  Surgeon: Schuyler Amor, MD;  Location: [place];  Service: Orthopedics;  Laterality: Right;  . CESAREAN SECTION    . LOOP RECORDER INSERTION N/A 10/08/2017   Procedure: LOOP RECORDER INSERTION;  Surgeon: Thompson Grayer, MD;  Location: [place] [facility];  Service: Cardiovascular;  Laterality: N/A;  . NOSE SURGERY     BROKEN   MANY YRS AGO   . TEE WITHOUT CARDIOVERSION N/A 10/08/2017   Procedure: TRANSESOPHAGEAL ECHOCARDIOGRAM (TEE);   Surgeon: Sanda Klein, MD;  Location: [place];  Service: Cardiovascular;  Laterality: N/A;  . TUBAL LIGATION    . WRIST FRACTURE SURGERY  Feb 2013    FAMILY HISTORY: Family History  Problem Relation Age of Onset  . Hypertension Father   . Diabetes Father     SOCIAL HISTORY: Social History   Socioeconomic History  . Marital status: Widowed    Spouse name: Not on file  . Number of children: Not on file  . Years of education: Not on file  . Highest education level: Not on file  Occupational History  . Not on file  Social Needs  . Financial resource strain: Not on file  . Food insecurity:    Worry: Not on file    Inability: Not on file  . Transportation needs:    Medical: Not on file    Non-medical: Not on file  Tobacco  Use  . Smoking status: Former Smoker    Last attempt to quit: 05/17/2017    Years since quitting: 1.3  . Smokeless tobacco: Never Used  . Tobacco comment: Smoking about 6 cigs/week  Substance and Sexual Activity  . Alcohol use: Yes    Alcohol/week: 21.0 standard drinks    Types: 21 Glasses of wine per week    Comment: DAILY ALCOHOL and WINE  . Drug use: No  . Sexual activity: Yes    Birth control/protection: None  Lifestyle  . Physical activity:    Days per week: Not on file    Minutes per session: Not on file  . Stress: Not on file  Relationships  . Social connections:    Talks on phone: Not on file    Gets together: Not on file    Attends religious service: Not on file    Active member of club or organization: Not on file    Attends meetings of clubs or organizations: Not on file    Relationship status: Not on file  . Intimate partner violence:    Fear of current or ex partner: Not on file    Emotionally abused: Not on file    Physically abused: Not on file    Forced sexual activity: Not on file  Other Topics Concern  . Not on file  Social History Narrative   Lives home with S.O. Vivien Presto.  Education level 12th grade.     Drinks coffee.  Has one child.      PHYSICAL EXAM  Vitals:   10/06/18 1336  BP: 118/74  Pulse: 71  Weight: (!) 315 lb 3.2 oz (143 kg)  Height: _0  (1.676 m)   Body mass index is 50.87 kg/m.  Generalized: Well developed, obese female in no acute distress  Head: normocephalic and atraumatic,. Oropharynx benign  Neck: Supple,  Musculoskeletal: No deformity   Neurological examination   Mentation: Alert oriented to time, place, history taking. Attention span and concentration appropriate. Recent and remote memory intact.  Follows all commands speech and language fluent.   Cranial nerve II-XII: Pupils were equal round reactive to light extraocular movements were full, visual field were full on confrontational test. Facial sensation and strength were normal. hearing was intact to finger rubbing bilaterally. Uvula tongue midline. head turning and shoulder shrug were normal and symmetric.Tongue protrusion into cheek strength was normal. Motor: normal bulk and tone, full strength in the BUE, BLE, Sensory: normal and symmetric to light touch,  Coordination: finger-nose-finger, heel-to-shin bilaterally, no dysmetria  Gait and Station: Rising up from seated position without assistance, normal stance,  moderate stride, good arm swing, smooth turning, able to perform tiptoe, and heel walking without difficulty. Tandem gait is steady  DIAGNOSTIC DATA (LABS, IMAGING, TESTING) - I reviewed patient records, labs, notes, testing and imaging myself where available.  Lab Results  Component Value Date   WBC 8.8 06/19/2017   HGB 12.3 06/19/2017   HCT 38.5 06/19/2017   MCV 84 06/19/2017   PLT 228 06/19/2017      Component Value Date/Time   NA 142 06/19/2017 1157   K 3.8 06/19/2017 1157   CL 102 06/19/2017 1157   CO2 27 06/19/2017 1157   GLUCOSE 91 06/19/2017 1157   GLUCOSE 103 (H) 05/19/2017 0450   BUN 15 06/19/2017 1157   CREATININE 0.72 06/19/2017 1157   CREATININE 0.85 12/28/2015 1435    CALCIUM 11.5 (H) 06/19/2017 1157   PROT 7.0 06/19/2017 1157  ALBUMIN 4.1 06/19/2017 1157   AST 18 06/19/2017 1157   ALT 19 06/19/2017 1157   ALKPHOS 105 06/19/2017 1157   BILITOT 0.3 06/19/2017 1157   GFRNONAA 95 06/19/2017 1157   GFRNONAA 79 07/29/2015 0935   GFRAA 110 06/19/2017 1157   GFRAA >89 07/29/2015 0935   Lab Results  Component Value Date   CHOL 141 06/19/2017   HDL 58 06/19/2017   LDLCALC 70 06/19/2017   TRIG 66 06/19/2017   CHOLHDL 2.4 06/19/2017   Lab Results  Component Value Date   HGBA1C 5.6 05/18/2017   No results found for: VITAMINB12 Lab Results  Component Value Date   TSH 1.950 06/19/2017      ASSESSMENT AND PLAN  Lilliah R McCainis a very pleasant 43 year oldfemalewith an underlying medical history of HTN, cerebellar stroke in June 2018, hyperlipidemia, vitamin D deficiency and morbid obesity with a BMI of over 97, who presents for follow-up  of her severe obstructive sleep apnea as determined by her baseline sleep study in November 2018. She then had a CPAP titration study in February 2019 with significant improvement in her sleep disorder breathing and her sleep architecture. She was advised regarding her study results and we compared findings. She also is advised to get back on track with CPAP usage. She was compliant in the beginning, had a 70% compliance in the month of late March through late April. She is motivated to continue with treatment and is going to call her DME company for supplies. She is advised to follow-up in sleep clinic in about 4 months, we discussed staying compliant  with CPAP therapy for severe sleep apnea to reduce her cardiovascular risks.    PLAN: CPAP compliance 20% Need to use CPAP every night , I explained in particular the risks and ramifications of untreated moderate to severe OSA, especially with respect to cardiovascular disease  including congestive heart failure, difficult to treat hypertension, cardiac arrhythmias,  or stroke. Even type 2 diabetes has, in part, been linked to untreated OSA. Symptoms of untreated OSA include daytime sleepiness, memory problems, mood irritability and mood disorder such as depression and anxiety, lack of energy, as well as recurrent headaches, especially morning headaches. We talked about trying to maintain a healthy lifestyle in general, as well as the importance of weight control. I encouraged the patient to eat healthy, exercise daily and keep well hydrated, to keep a scheduled bedtime and wake time routine, to not skip any meals and eat healthy snacks in between meals F/u in 4 months Dennie Bible, [facility], [facility], [facility] [address] [place], [place] [address] [phone]  I reviewed the above note and documentation by the Nurse Practitioner and agree with the history, physical exam, assessment and plan as outlined above. I was immediately available for face-to-face consultation. Star Age, MD, PhD [facility])

## 2018-10-06 ENCOUNTER — Encounter: Payer: Self-pay | Admitting: Nurse Practitioner

## 2018-10-06 ENCOUNTER — Ambulatory Visit (INDEPENDENT_AMBULATORY_CARE_PROVIDER_SITE_OTHER): Payer: Medicare HMO | Admitting: Nurse Practitioner

## 2018-10-06 DIAGNOSIS — Z9989 Dependence on other enabling machines and devices: Secondary | ICD-10-CM | POA: Diagnosis not present

## 2018-10-06 DIAGNOSIS — G4733 Obstructive sleep apnea (adult) (pediatric): Secondary | ICD-10-CM | POA: Diagnosis not present

## 2018-10-06 NOTE — Patient Instructions (Signed)
CPAP compliance 20% Need to use CPAP every night  I explained in particular the risks and ramifications of untreated moderate to severe OSA, especially with respect to cardiovascular disease  including congestive heart failure, difficult to treat hypertension, cardiac arrhythmias, or stroke. Even type 2 diabetes has, in part, been linked to untreated OSA. Symptoms of untreated OSA include daytime sleepiness, memory problems, mood irritability and mood disorder such as depression and anxiety, lack of energy, as well as recurrent headaches, especially morning headaches. We talked about trying to maintain a healthy lifestyle in general, as well as the importance of weight control. I encouraged the patient to eat healthy, exercise daily and keep well hydrated, to keep a scheduled bedtime and wake time routine, to not skip any meals and eat healthy snacks in between meals F/u in 4 months

## 2018-10-17 DIAGNOSIS — G4733 Obstructive sleep apnea (adult) (pediatric): Secondary | ICD-10-CM | POA: Diagnosis not present

## 2018-10-22 LAB — CUP PACEART REMOTE DEVICE CHECK
Date Time Interrogation Session: 20191026233941
MDC IDC PG IMPLANT DT: 20181106

## 2018-10-23 DIAGNOSIS — Z23 Encounter for immunization: Secondary | ICD-10-CM | POA: Diagnosis not present

## 2018-10-23 DIAGNOSIS — R7303 Prediabetes: Secondary | ICD-10-CM | POA: Diagnosis not present

## 2018-10-23 DIAGNOSIS — H538 Other visual disturbances: Secondary | ICD-10-CM | POA: Diagnosis not present

## 2018-10-23 DIAGNOSIS — I10 Essential (primary) hypertension: Secondary | ICD-10-CM | POA: Diagnosis not present

## 2018-10-23 DIAGNOSIS — Z01118 Encounter for examination of ears and hearing with other abnormal findings: Secondary | ICD-10-CM | POA: Diagnosis not present

## 2018-10-23 DIAGNOSIS — Z720 Tobacco use: Secondary | ICD-10-CM | POA: Diagnosis not present

## 2018-10-23 DIAGNOSIS — I63442 Cerebral infarction due to embolism of left cerebellar artery: Secondary | ICD-10-CM | POA: Diagnosis not present

## 2018-10-23 DIAGNOSIS — M170 Bilateral primary osteoarthritis of knee: Secondary | ICD-10-CM | POA: Diagnosis not present

## 2018-10-23 DIAGNOSIS — E213 Hyperparathyroidism, unspecified: Secondary | ICD-10-CM | POA: Diagnosis not present

## 2018-10-23 DIAGNOSIS — G4733 Obstructive sleep apnea (adult) (pediatric): Secondary | ICD-10-CM | POA: Diagnosis not present

## 2018-10-23 DIAGNOSIS — Z0001 Encounter for general adult medical examination with abnormal findings: Secondary | ICD-10-CM | POA: Diagnosis not present

## 2018-10-23 DIAGNOSIS — Z131 Encounter for screening for diabetes mellitus: Secondary | ICD-10-CM | POA: Diagnosis not present

## 2018-10-28 ENCOUNTER — Telehealth: Payer: Self-pay | Admitting: Cardiology

## 2018-10-28 NOTE — Telephone Encounter (Signed)
Spoke w/ pt and requested that she send a manual transmission b/c her home monitor has not updated in at least 14 days.   

## 2018-11-03 ENCOUNTER — Ambulatory Visit (INDEPENDENT_AMBULATORY_CARE_PROVIDER_SITE_OTHER): Payer: Medicare HMO

## 2018-11-03 DIAGNOSIS — I639 Cerebral infarction, unspecified: Secondary | ICD-10-CM

## 2018-11-03 NOTE — Progress Notes (Signed)
Carelink Summary Report / Loop Recorder 

## 2018-11-05 DIAGNOSIS — E782 Mixed hyperlipidemia: Secondary | ICD-10-CM | POA: Diagnosis not present

## 2018-11-05 DIAGNOSIS — Z720 Tobacco use: Secondary | ICD-10-CM | POA: Diagnosis not present

## 2018-11-05 DIAGNOSIS — R7303 Prediabetes: Secondary | ICD-10-CM | POA: Diagnosis not present

## 2018-11-05 DIAGNOSIS — I63442 Cerebral infarction due to embolism of left cerebellar artery: Secondary | ICD-10-CM | POA: Diagnosis not present

## 2018-11-19 DIAGNOSIS — I10 Essential (primary) hypertension: Secondary | ICD-10-CM | POA: Diagnosis not present

## 2018-11-19 DIAGNOSIS — E782 Mixed hyperlipidemia: Secondary | ICD-10-CM | POA: Diagnosis not present

## 2018-11-19 DIAGNOSIS — M170 Bilateral primary osteoarthritis of knee: Secondary | ICD-10-CM | POA: Diagnosis not present

## 2018-11-19 DIAGNOSIS — R7303 Prediabetes: Secondary | ICD-10-CM | POA: Diagnosis not present

## 2018-11-19 DIAGNOSIS — Z720 Tobacco use: Secondary | ICD-10-CM | POA: Diagnosis not present

## 2018-11-19 DIAGNOSIS — E213 Hyperparathyroidism, unspecified: Secondary | ICD-10-CM | POA: Diagnosis not present

## 2018-11-22 DIAGNOSIS — G4733 Obstructive sleep apnea (adult) (pediatric): Secondary | ICD-10-CM | POA: Diagnosis not present

## 2018-11-27 LAB — CUP PACEART REMOTE DEVICE CHECK
Date Time Interrogation Session: 20191223213200
Implantable Pulse Generator Implant Date: 20181106

## 2018-12-02 ENCOUNTER — Ambulatory Visit (INDEPENDENT_AMBULATORY_CARE_PROVIDER_SITE_OTHER): Payer: Medicare HMO

## 2018-12-02 DIAGNOSIS — I639 Cerebral infarction, unspecified: Secondary | ICD-10-CM

## 2018-12-02 LAB — CUP PACEART REMOTE DEVICE CHECK
Date Time Interrogation Session: 20191231221528
MDC IDC PG IMPLANT DT: 20181106

## 2018-12-04 NOTE — Progress Notes (Signed)
Carelink Summary Report / Loop Recorder 

## 2018-12-31 DIAGNOSIS — E782 Mixed hyperlipidemia: Secondary | ICD-10-CM | POA: Diagnosis not present

## 2018-12-31 DIAGNOSIS — Z720 Tobacco use: Secondary | ICD-10-CM | POA: Diagnosis not present

## 2018-12-31 DIAGNOSIS — M170 Bilateral primary osteoarthritis of knee: Secondary | ICD-10-CM | POA: Diagnosis not present

## 2018-12-31 DIAGNOSIS — R7303 Prediabetes: Secondary | ICD-10-CM | POA: Diagnosis not present

## 2018-12-31 DIAGNOSIS — Z0001 Encounter for general adult medical examination with abnormal findings: Secondary | ICD-10-CM | POA: Diagnosis not present

## 2018-12-31 DIAGNOSIS — E213 Hyperparathyroidism, unspecified: Secondary | ICD-10-CM | POA: Diagnosis not present

## 2018-12-31 DIAGNOSIS — I10 Essential (primary) hypertension: Secondary | ICD-10-CM | POA: Diagnosis not present

## 2019-01-05 ENCOUNTER — Ambulatory Visit (INDEPENDENT_AMBULATORY_CARE_PROVIDER_SITE_OTHER): Payer: Medicare HMO

## 2019-01-05 DIAGNOSIS — I639 Cerebral infarction, unspecified: Secondary | ICD-10-CM

## 2019-01-08 ENCOUNTER — Encounter (INDEPENDENT_AMBULATORY_CARE_PROVIDER_SITE_OTHER): Payer: Self-pay

## 2019-01-11 LAB — CUP PACEART REMOTE DEVICE CHECK
Date Time Interrogation Session: 20200203003613
Implantable Pulse Generator Implant Date: 20181106

## 2019-01-13 NOTE — Progress Notes (Signed)
Carelink Summary Report / Loop Recorder 

## 2019-01-28 DIAGNOSIS — Z720 Tobacco use: Secondary | ICD-10-CM | POA: Diagnosis not present

## 2019-01-28 DIAGNOSIS — E213 Hyperparathyroidism, unspecified: Secondary | ICD-10-CM | POA: Diagnosis not present

## 2019-01-28 DIAGNOSIS — R7303 Prediabetes: Secondary | ICD-10-CM | POA: Diagnosis not present

## 2019-01-28 DIAGNOSIS — E782 Mixed hyperlipidemia: Secondary | ICD-10-CM | POA: Diagnosis not present

## 2019-01-28 DIAGNOSIS — M170 Bilateral primary osteoarthritis of knee: Secondary | ICD-10-CM | POA: Diagnosis not present

## 2019-01-28 DIAGNOSIS — I10 Essential (primary) hypertension: Secondary | ICD-10-CM | POA: Diagnosis not present

## 2019-02-02 ENCOUNTER — Encounter: Payer: Self-pay | Admitting: Family Medicine

## 2019-02-03 NOTE — Progress Notes (Addendum)
[facility]  PATIENT: Kathleen [name] DOB: 1961/09/12   REASON FOR VISIT: Follow-up for obstructive sleep apnea with CPAP compliance  history of stroke HISTORY FROM: Patient    HISTORY OF PRESENT ILLNESS: Kathleen [name] (Kathleen [name]) is a 58 year old right-handed woman with an underlying medical history of HTN, cerebellar stroke in June 2018, hyperlipidemia, vitamin D deficiency and morbid obesity, who presents for follow-up consultation of her obstructive sleep apnea, after sleep study testing. The patient is unaccompanied today. I first met her on 10/02/2017 at the request of Dr. Leta Baptist, at which time she reported snoring and excessive daytime somnolence. I suggested we proceed with sleep study testing. She had a baseline sleep study, followed by a CPAP titration study. I went over her test results with her in detail today. Baseline sleep study from 10/31/2017 showed a sleep latency of 38 minutes, REM latency was delayed at 240 minutes, sleep efficiency reduced at 66.2%. She had an increased percentage of stage I sleep, and a significantly reduced percentage of REM sleep. Total AHI was 60 per hour, REM AHI was 87 per hour, supine AHI was 100.2 per hour. Average oxygen saturation was 93%, nadir was 71%. Based on her test results I suggested we proceed with a CPAP titration study. She had this on 01/21/2018. Sleep efficiency was 94.4%, sleep latency was 6.5 minutes REM latency 126.5 minutes. She had an increased percentage of slow-wave sleep and REM sleep. She was fitted with nasal pillows and CPAP was initiated at 6 cm and advanced 12 cm. At a pressure of 12 cm her AHI was reduced to 0 per hour with supine REM sleep achieved an O2 nadir of 91%. Based on her test results I prescribed CPAP therapy of 12 cm for home use.  Today, 04/16/2018: I reviewed her CPAP compliance data from the last 30 days but she is no longer using her CPAP, left a registered wears 03/22/2018. She was very  consistent with her CPAP usage in the beginning, in the month of March through mid April she had a compliance percentage of 70%, from 02/20/2018 through 03/21/2018 she used her CPAP 24 out of 30 days with a percent used days greater than 4 hours) 70%, indicating compliance. Average usage was 6 hours and 56 minutes, residual AHI at goal at 0.8 per hour, leak in the low/acceptable range with the 95th percentile at 8.7 L/m on a pressure of 12 cm with EPR of 3. She reports that she feels she needs a new mask. She has not called her DME company. She had interim weight gain, but stable since last visit with Dr. Leta Baptist in March 2019. Of note, she had some interim weight gain. She interim loop recorder insertion in November, after having a TEE. She admits that she has not used her CPAP in the past couple weeks. She reports that she felt she needed a new mask. When she first started using CPAP and was consistent with the usage, she did notice an improvement in her daytime somnolence and sleep quality. UPDATE 11/4/2019CM Kathleen [name], 58 year old female returns for follow-up with history of obstructive sleep apnea which is severe.  She admits that she does not use her CPAP every night.  Compliance data dated 09/06/2018-10/05/2018 shows compliance greater than 4 hours at 20% or 6 days.  Average usage 7 hours 20 minutes.  Set pressure 12 cm EPR level 3 leak 95th percentile 11.6 AHI 0.3.  She says she needs a new mask but has not called  her DME company.  She has a history of stroke in June 2018.  She claims she is due to get routine labs with her primary care in the next week or so.  She returns for reevaluation  UPDATE 3/4/2020CM Kathleen [name], 58 year old female returns for follow-up with history of obstructive sleep apnea which is severe.  She is here for CPAP compliance she continues to have poor compliance.  Data dated 12/05/2018-02/02/2019 shows compliance greater than 4 hours at 37% for 22 days less than 4 hours 12% for 7  days for total compliance 29 out of 60 days at 48%.  Average usage 6 hours set pressure 12 leak 95th percentile 5.2 AHI 0.8 ESS 1.  She returns for reevaluation REVIEW OF SYSTEMS: Full 14 system review of systems performed and notable only for those listed, all others are neg:  Constitutional: neg  Cardiovascular: neg Ear/Nose/Throat: neg  Skin: neg Eyes: neg Respiratory: neg Gastroitestinal: neg  Hematology/Lymphatic: neg  Endocrine: neg Musculoskeletal: Joint pain Allergy/Immunology: neg Neurological: neg Psychiatric: neg Sleep : Obstructive sleep apnea with CPAP   ALLERGIES: Allergies  Allergen Reactions  . Hydrocodone Itching  . Lisinopril Cough  . Tramadol Itching    HOME MEDICATIONS: Outpatient Medications Prior to Visit  Medication Sig Dispense Refill  . aspirin 325 MG tablet Take 1 tablet (325 mg total) by mouth daily. 90 tablet 3  . atorvastatin (LIPITOR) 40 MG tablet Take 1 tablet (40 mg total) by mouth daily at 6 PM. 30 tablet 0  . Cholecalciferol (VITAMIN D PO) Take by mouth. 5000 u daily    . cyclobenzaprine (FLEXERIL) 10 MG tablet Take 10 mg by mouth at bedtime as needed for muscle spasms.    . hydrochlorothiazide (HYDRODIURIL) 25 MG tablet Take 0.5 tablets (12.5 mg total) by mouth daily. 30 tablet 0  . losartan (COZAAR) 100 MG tablet Take 1 tablet (100 mg total) by mouth daily. (Patient taking differently: Take 50 mg by mouth daily. ) 30 tablet 0   No facility-administered medications prior to visit.     PAST MEDICAL HISTORY: Past Medical History:  Diagnosis Date  . Anxiety   . Asthma   . Hypertension    NO MED MD TO GIVE RX  . Obesity   . Stroke [facility]) 05/2017    PAST SURGICAL HISTORY: Past Surgical History:  Procedure Laterality Date  . CARPAL TUNNEL RELEASE  01/12/2012   Procedure: CARPAL TUNNEL RELEASE;  Surgeon: Schuyler Amor, MD;  Location: [facility];  Service: Orthopedics;  Laterality: Right;  . CESAREAN SECTION    . LOOP RECORDER INSERTION  N/A 10/08/2017   Procedure: LOOP RECORDER INSERTION;  Surgeon: Thompson Grayer, MD;  Location: [facility];  Service: Cardiovascular;  Laterality: N/A;  . NOSE SURGERY     BROKEN   MANY YRS AGO   . TEE WITHOUT CARDIOVERSION N/A 10/08/2017   Procedure: TRANSESOPHAGEAL ECHOCARDIOGRAM (TEE);  Surgeon: Sanda Klein, MD;  Location: [facility];  Service: Cardiovascular;  Laterality: N/A;  . TUBAL LIGATION    . WRIST FRACTURE SURGERY  Feb 2013    FAMILY HISTORY: Family History  Problem Relation Age of Onset  . Hypertension Father   . Diabetes Father     SOCIAL HISTORY: Social History   Socioeconomic History  . Marital status: Widowed    Spouse name: Not on file  . Number of children: Not on file  . Years of education: Not on file  . Highest education level: Not on file  Occupational  History  . Not on file  Social Needs  . Financial resource strain: Not on file  . Food insecurity:    Worry: Not on file    Inability: Not on file  . Transportation needs:    Medical: Not on file    Non-medical: Not on file  Tobacco Use  . Smoking status: Former Smoker    Last attempt to quit: 05/17/2017    Years since quitting: 1.7  . Smokeless tobacco: Never Used  . Tobacco comment: Smoking about 6 cigs/week  Substance and Sexual Activity  . Alcohol use: Yes    Alcohol/week: 21.0 standard drinks    Types: 21 Glasses of wine per week    Comment: DAILY ALCOHOL and WINE  . Drug use: No  . Sexual activity: Yes    Birth control/protection: None  Lifestyle  . Physical activity:    Days per week: Not on file    Minutes per session: Not on file  . Stress: Not on file  Relationships  . Social connections:    Talks on phone: Not on file    Gets together: Not on file    Attends religious service: Not on file    Active member of club or organization: Not on file    Attends meetings of clubs or organizations: Not on file    Relationship status: Not on file  . Intimate partner violence:      Fear of current or ex partner: Not on file    Emotionally abused: Not on file    Physically abused: Not on file    Forced sexual activity: Not on file  Other Topics Concern  . Not on file  Social History Narrative   Lives home with S.O. Vivien Presto.  Education level 12th grade.    Drinks coffee.  Has one child.      PHYSICAL EXAM  Vitals:   02/04/19 1350  BP: 135/87  Pulse: 99  Weight: (!) 315 lb 6.4 oz (143.1 kg)  Height: _0  (1.676 m)   Body mass index is 50.91 kg/m.  Generalized: Well developed, obese female in no acute distress  Head: normocephalic and atraumatic,. Oropharynx benign  Neck: Supple,  Musculoskeletal: No deformity   Neurological examination   Mentation: Alert oriented to time, place, history taking. Attention span and concentration appropriate. Recent and remote memory intact.  Follows all commands speech and language fluent.   Cranial nerve II-XII: Pupils were equal round reactive to light extraocular movements were full, visual field were full on confrontational test. Facial sensation and strength were normal. hearing was intact to finger rubbing bilaterally. Uvula tongue midline. head turning and shoulder shrug were normal and symmetric.Tongue protrusion into cheek strength was normal. Motor: normal bulk and tone, full strength in the BUE, BLE, Sensory: normal and symmetric to light touch,  Coordination: finger-nose-finger, heel-to-shin bilaterally, no dysmetria  Gait and Station: Rising up from seated position without assistance, normal stance,  moderate stride, good arm swing, smooth turning, able to perform tiptoe, and heel walking without difficulty. Tandem gait is steady  DIAGNOSTIC DATA (LABS, IMAGING, TESTING) - I reviewed patient records, labs, notes, testing and imaging myself where available.  Lab Results  Component Value Date   WBC 8.8 06/19/2017   HGB 12.3 06/19/2017   HCT 38.5 06/19/2017   MCV 84 06/19/2017   PLT 228 06/19/2017       Component Value Date/Time   NA 142 06/19/2017 1157   K 3.8 06/19/2017 1157  CL 102 06/19/2017 1157   CO2 27 06/19/2017 1157   GLUCOSE 91 06/19/2017 1157   GLUCOSE 103 (H) 05/19/2017 0450   BUN 15 06/19/2017 1157   CREATININE 0.72 06/19/2017 1157   CREATININE 0.85 12/28/2015 1435   CALCIUM 11.5 (H) 06/19/2017 1157   PROT 7.0 06/19/2017 1157   ALBUMIN 4.1 06/19/2017 1157   AST 18 06/19/2017 1157   ALT 19 06/19/2017 1157   ALKPHOS 105 06/19/2017 1157   BILITOT 0.3 06/19/2017 1157   GFRNONAA 95 06/19/2017 1157   GFRNONAA 79 07/29/2015 0935   GFRAA 110 06/19/2017 1157   GFRAA >89 07/29/2015 0935   Lab Results  Component Value Date   CHOL 141 06/19/2017   HDL 58 06/19/2017   LDLCALC 70 06/19/2017   TRIG 66 06/19/2017   CHOLHDL 2.4 06/19/2017   Lab Results  Component Value Date   HGBA1C 5.6 05/18/2017   No results found for: [id number] Lab Results  Component Value Date   TSH 1.950 06/19/2017      ASSESSMENT AND PLAN  [facility], is a very pleasant 43 year oldfemalewith an underlying medical history of HTN, cerebellar stroke in June 2018, hyperlipidemia, vitamin D deficiency and morbid obesity with a BMI of over 22, who presents for follow-up  of her severe obstructive sleep apnea as determined by her baseline sleep study in November 2018. She then had a CPAP titration study in February 2019 with significant improvement in her sleep disorder breathing and her sleep architecture. She was advised regarding her study results and we compared findings. She also is advised to get back on track with CPAP usage. She was compliant in the beginning. She is motivated to continue with treatment and is going to call her DME company for supplies. She is advised to follow-up in sleep clinic in about 3 months, we discussed staying compliant  with CPAP therapy for severe sleep apnea to reduce her cardiovascular risks.Data dated 12/05/2018-02/02/2019 shows compliance greater than 4  hours at 37% for 22 days less than 4 hours 12% for 7 days for total compliance 29 out of 60 days at 48%.  Average usage 6 hours set pressure 12 leak 95th percentile 5.2 AHI 0.8 ESS 1.     PLAN: CPAP compliance 37% greater than 4 hours.  12% less than 4 hours.  Total compliance 48%for60 days reviewed data with patient Need to use CPAP every night , I explained in particular the risks and ramifications of untreated moderate to severe OSA, especially with respect to cardiovascular disease  including congestive heart failure, difficult to treat hypertension, cardiac arrhythmias, or stroke. Even type 2 diabetes has, in part, been linked to untreated OSA. Symptoms of untreated OSA include daytime sleepiness, memory problems, mood irritability and mood disorder such as depression and anxiety, lack of energy, as well as recurrent headaches, especially morning headaches. We talked about trying to maintain a healthy lifestyle in general, as well as the importance of weight control. I encouraged the patient to eat healthy, exercise daily and keep well hydrated, to keep a scheduled bedtime and wake time routine, to not skip any meals and eat healthy snacks in between meals F/u in 3 months Dennie Bible, [facility], [facility], [facility] [address] [place], [place] [address] [phone] I reviewed the above note and documentation by the Nurse Practitioner and agree with the history, physical exam, assessment and plan as outlined above. I was immediately available for face-to-face consultation. Star Age, MD, PhD  [facility] ([facility])

## 2019-02-04 ENCOUNTER — Encounter: Payer: Self-pay | Admitting: Nurse Practitioner

## 2019-02-04 ENCOUNTER — Ambulatory Visit (INDEPENDENT_AMBULATORY_CARE_PROVIDER_SITE_OTHER): Payer: Medicare HMO | Admitting: Nurse Practitioner

## 2019-02-04 VITALS — BP 135/87 | HR 99 | Ht 66.0 in | Wt 315.4 lb

## 2019-02-04 DIAGNOSIS — Z9989 Dependence on other enabling machines and devices: Secondary | ICD-10-CM

## 2019-02-04 DIAGNOSIS — G4733 Obstructive sleep apnea (adult) (pediatric): Secondary | ICD-10-CM

## 2019-02-04 NOTE — Patient Instructions (Signed)
CPAP compliance 37% greater than 4 hours.  12% less than 4 hours.  Total compliance 48% Need to use CPAP every night , I explained in particular the risks and ramifications of untreated moderate to severe OSA, especially with respect to cardiovascular disease  including congestive heart failure, difficult to treat hypertension, cardiac arrhythmias, or stroke. Even type 2 diabetes has, in part, been linked to untreated OSA. Symptoms of untreated OSA include daytime sleepiness, memory problems, mood irritability and mood disorder such as depression and anxiety, lack of energy, as well as recurrent headaches, especially morning headaches. We talked about trying to maintain a healthy lifestyle in general, as well as the importance of weight control. I encouraged the patient to eat healthy, exercise daily and keep well hydrated, to keep a scheduled bedtime and wake time routine, to not skip any meals and eat healthy snacks in between meals F/u in 3 months

## 2019-02-06 ENCOUNTER — Ambulatory Visit (INDEPENDENT_AMBULATORY_CARE_PROVIDER_SITE_OTHER): Payer: Medicare Other | Admitting: *Deleted

## 2019-02-06 DIAGNOSIS — I639 Cerebral infarction, unspecified: Secondary | ICD-10-CM | POA: Diagnosis not present

## 2019-02-08 LAB — CUP PACEART REMOTE DEVICE CHECK
Date Time Interrogation Session: 20200307014103
Implantable Pulse Generator Implant Date: 20181106

## 2019-02-13 NOTE — Progress Notes (Signed)
Carelink Summary Report / Loop Recorder 

## 2019-02-20 ENCOUNTER — Telehealth: Payer: Self-pay

## 2019-02-20 NOTE — Telephone Encounter (Signed)
Unable to leave a message for patient to remind of missed remote transmission.  

## 2019-03-11 ENCOUNTER — Encounter: Payer: Medicare Other | Admitting: *Deleted

## 2019-03-11 ENCOUNTER — Other Ambulatory Visit: Payer: Self-pay

## 2019-03-16 ENCOUNTER — Telehealth: Payer: Self-pay | Admitting: Cardiology

## 2019-03-16 NOTE — Telephone Encounter (Signed)
Attempted to call pt and request a manual transmission w/ her home monitor. No answer and unable to leave a message.

## 2019-03-17 NOTE — Telephone Encounter (Signed)
I attempted for the 2nd time to call pt to request a manuel transmission with her home monitor but pt has a voicemail that has not been set up yet

## 2019-03-19 NOTE — Telephone Encounter (Signed)
This is the 3rd attempt to try to reach the pt to request a manual transmission with her home monitor but received the voicemail that is not set up.

## 2019-03-20 NOTE — Telephone Encounter (Signed)
Sent letter 03/20/2019

## 2019-03-27 NOTE — Telephone Encounter (Signed)
Spoke w/ pt and attempted to help her trouble shoot her home monitor. Instructed her to call tech support for further help trouble shooting the monitor.

## 2019-04-01 NOTE — Telephone Encounter (Signed)
I called the pt to get her to send a manual transmission and to see if she called [employer] about her monitor however, I did not get an answer from the pt and she has a voicemail that is not set up.

## 2019-04-02 NOTE — Telephone Encounter (Signed)
I was trying to reach out to the pt to get her to send a manual transmission with her home monitor. Pt has a voicemail that is not set up.

## 2019-04-03 NOTE — Telephone Encounter (Signed)
This is my 3rd attempt to try to contact the pt to see if she called [employer] tech support to get the help needed to send a manual transmission. The pt has a voicemail that is not set up. I am sending her a letter and will send a certified letter [phone] if she do not respond.

## 2019-04-14 ENCOUNTER — Ambulatory Visit (INDEPENDENT_AMBULATORY_CARE_PROVIDER_SITE_OTHER): Payer: Medicare Other | Admitting: *Deleted

## 2019-04-14 ENCOUNTER — Other Ambulatory Visit: Payer: Self-pay

## 2019-04-14 DIAGNOSIS — I639 Cerebral infarction, unspecified: Secondary | ICD-10-CM | POA: Diagnosis not present

## 2019-04-15 NOTE — Telephone Encounter (Signed)
Certified Letter sent 04/15/2019

## 2019-04-20 LAB — CUP PACEART REMOTE DEVICE CHECK
Date Time Interrogation Session: 20200512033954
Implantable Pulse Generator Implant Date: 20181106

## 2019-04-23 IMAGING — MR MR MRA HEAD W/O CM
9 of 11 series · 30 of 48 positions shown · non-contrast
Comparison: CT head 05/17/2017

CLINICAL DATA: Dizziness.  Stroke.

EXAM:
MRI HEAD WITHOUT CONTRAST
MRA HEAD WITHOUT CONTRAST
TECHNIQUE: Multiplanar, multiecho pulse sequences of the brain and surrounding
structures were obtained without intravenous contrast. Angiographic
images of the head were obtained using MRA technique without
contrast.

[Series 2: FLAIR · sagittal · 5.0mm · 0.47mm/px · 1 of 23 slices shown (1 of 2)]
[im 1/23]
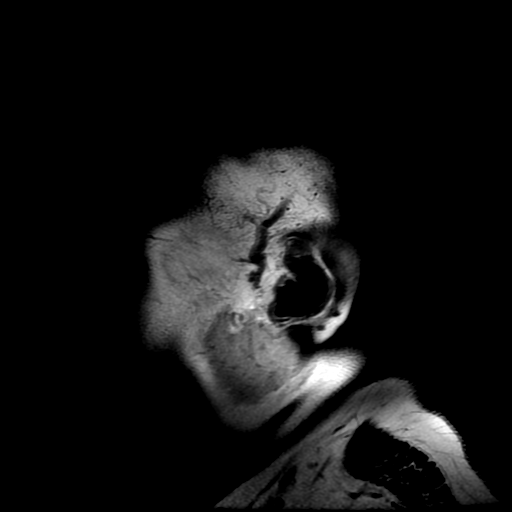

[Series 4: DWI · axial · 3.0mm · 0.94mm/px · z∈[-57,+90]mm · 7 of 100 slices shown (1 of 2)]
[im 1/100]
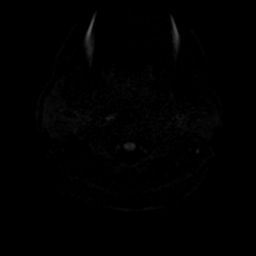
[im 17/100]
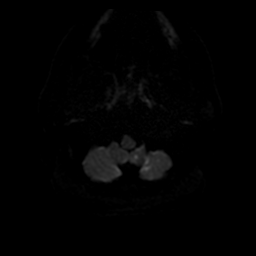
[im 34/100]
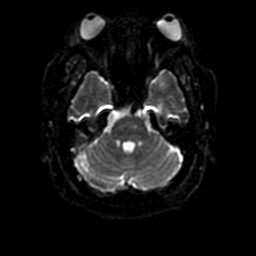
[im 50/100]
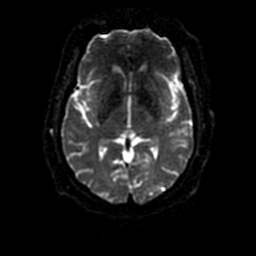
[im 67/100]
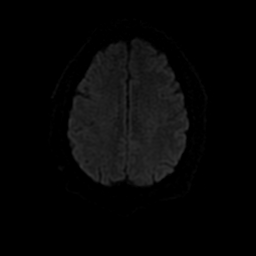
[im 83/100]
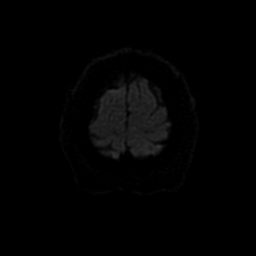
[im 100/100]
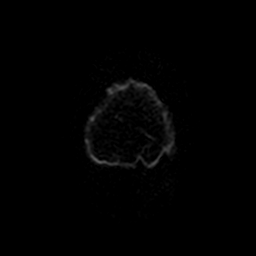

[Series 5: ax (id) 2 · axial · 1.0mm · 0.43mm/px · z∈[-57,+9]mm · 6 of 184 slices shown]
[im 1/184]
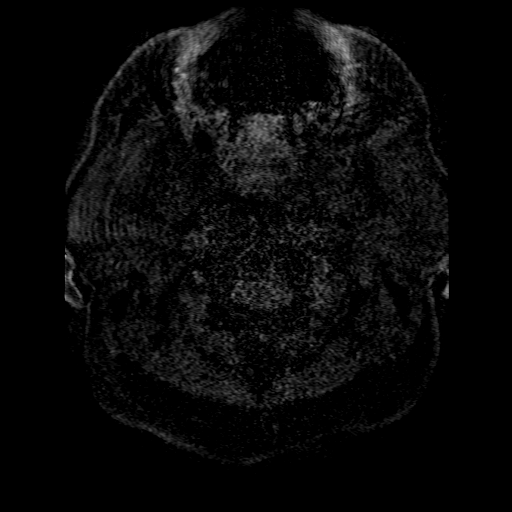
[im 34/184]
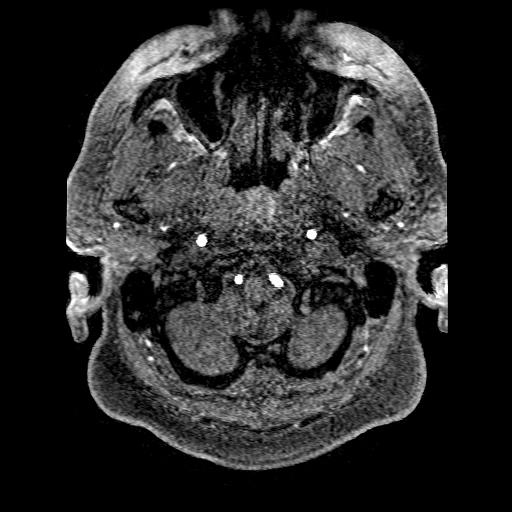
[im 50/184]
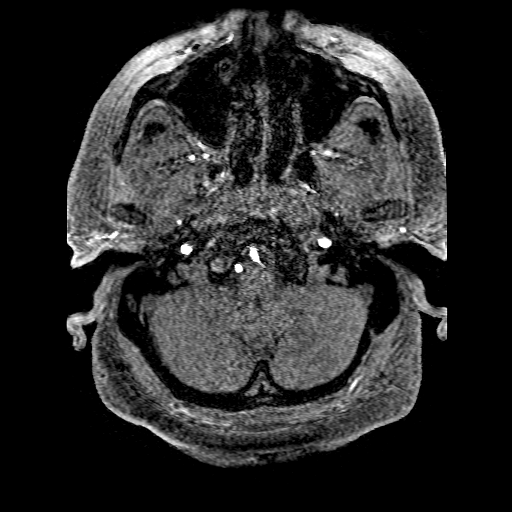
[im 84/184]
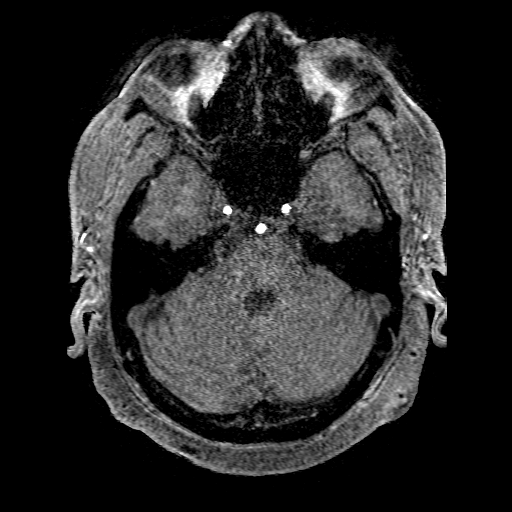
[im 100/184]
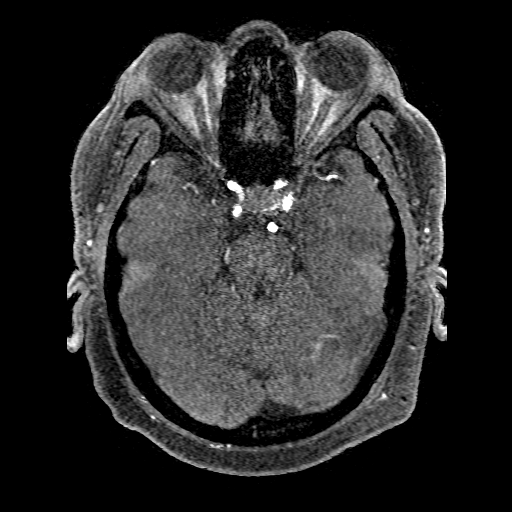
[im 134/184]
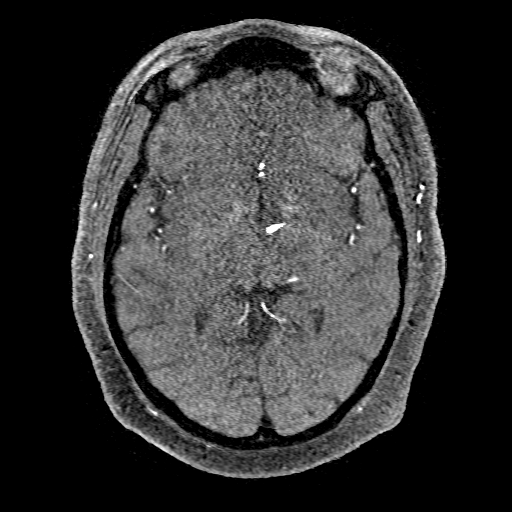

[Series 7: T2 · axial · 5.0mm · 0.47mm/px · z∈[-88,+80]mm · 2 of 29 slices shown (1 of 2)]
[im 1/29]
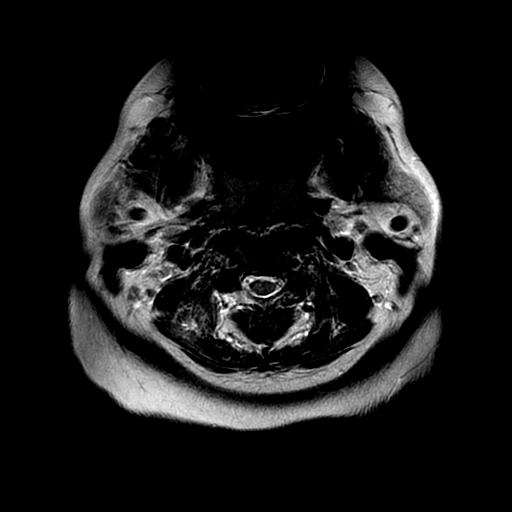
[im 29/29]
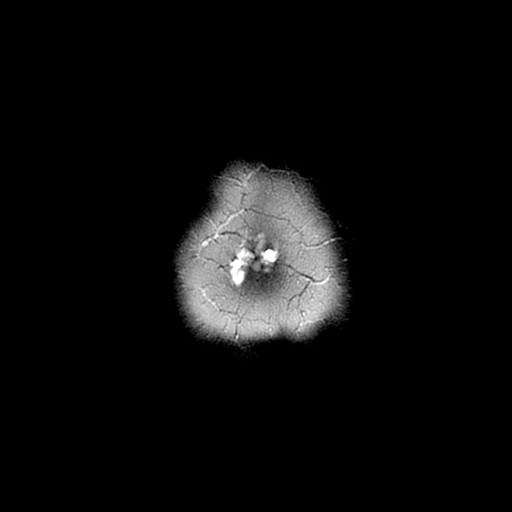

[Series 8: DWI · coronal · 4.0mm · 0.94mm/px · 5 of 72 slices shown (2 of 2)]
[im 1/72]
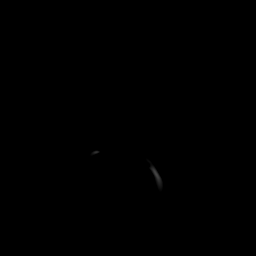
[im 18/72]
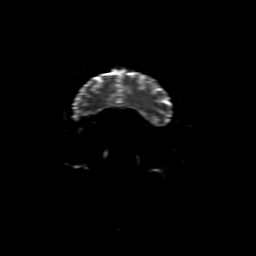
[im 36/72]
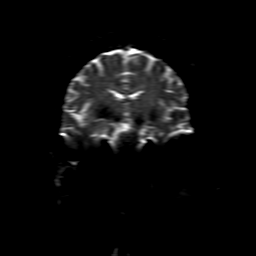
[im 54/72]
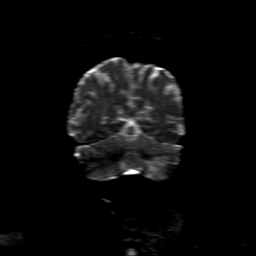
[im 72/72]
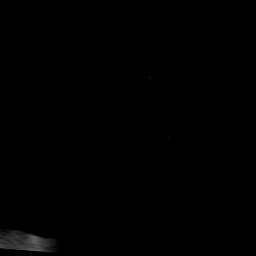

[Series 12: T2 · coronal · 5.0mm · 0.39mm/px · 2 of 27 slices shown (2 of 2)]
[im 1/27]
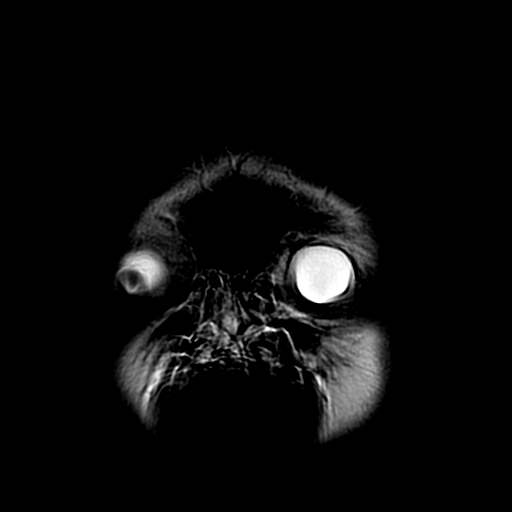
[im 27/27]
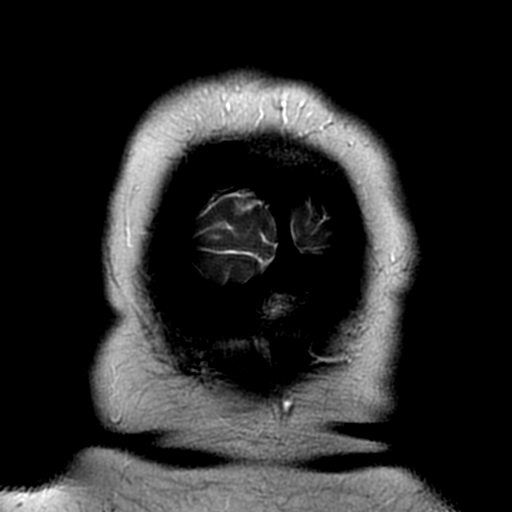

[Series 13: FLAIR · axial · 5.0mm · 0.47mm/px · z∈[-64,+80]mm · 2 of 25 slices shown (2 of 2)]
[im 1/25]
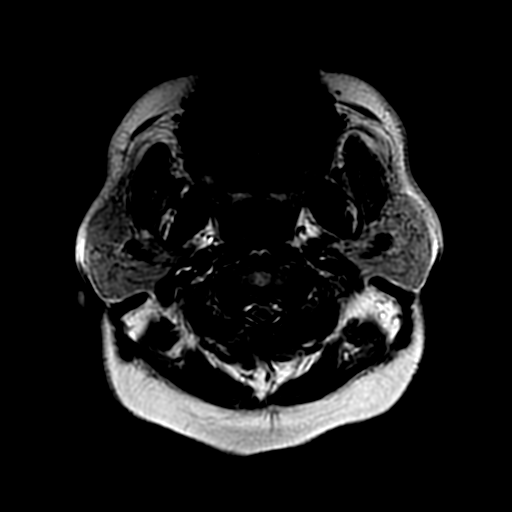
[im 25/25]
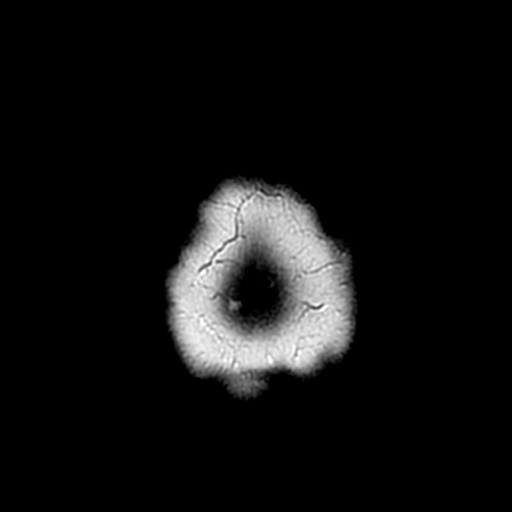

[Series 450: ADC · axial · 3.0mm · 0.94mm/px · z∈[-57,+90]mm · 3 of 50 slices shown (1 of 2)]
[im 1/50]
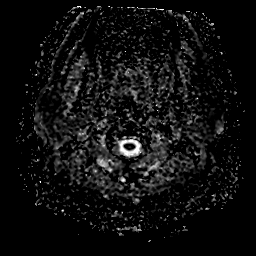
[im 25/50]
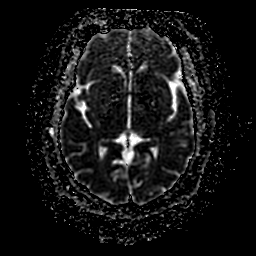
[im 50/50]
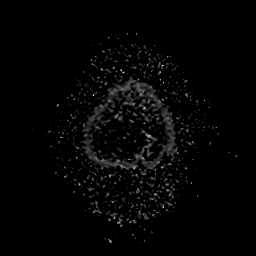

[Series 850: ADC · coronal · 4.0mm · 0.94mm/px · 2 of 36 slices shown (2 of 2)]
[im 1/36]
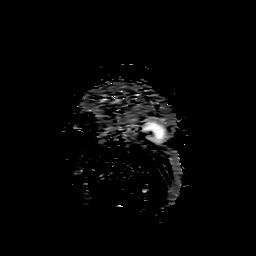
[im 36/36]
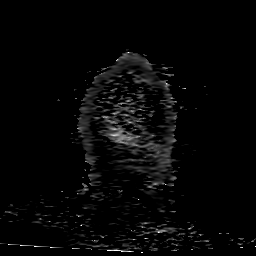

[30 of 48 positions shown; findings below may reference images not displayed]

FINDINGS: MRI HEAD FINDINGS

Brain: Acute infarct in the left inferior cerebellum PICA territory.
No other areas of acute infarct. No hemorrhage in the infarct.

Ventricle size normal. Multiple small chronic white matter
hyperintensities. Chronic ischemia in the pons left greater than
right. Chronic microhemorrhage in the right medial thalamus.
Negative for mass lesion.

Vascular: Normal arterial flow voids

Skull and upper cervical spine: Negative

Sinuses/Orbits: Mild mucosal edema in the paranasal sinuses. Normal
orbit.

Other: None

MRA HEAD FINDINGS

Internal carotid artery widely patent bilaterally. Anterior and
middle cerebral arteries are patent bilaterally. Mild
atherosclerotic disease in the anterior cerebral arteries
bilaterally. Mild atherosclerotic disease right middle cerebral
artery branches. No large vessel occlusion

Both vertebral arteries patent to the basilar. Basilar widely
patent. Superior cerebellar and posterior cerebral arteries patent
bilaterally

Negative for cerebral aneurysm
IMPRESSION: Acute infarct left inferior cerebellum without hemorrhage

Chronic microhemorrhage right medial thalamus

Mild intracranial atherosclerotic disease in the anterior cerebral
arteries and right middle cerebral artery. No large vessel
occlusion.

## 2019-04-28 ENCOUNTER — Telehealth: Payer: Self-pay

## 2019-04-28 NOTE — Telephone Encounter (Signed)
Spoke with the patient and they have given verbal consent to file their insurance and to do a doxy[employer]me visit. Mobile number and carrier have been confirmed and sent.   Text: [phone] Museum/gallery curator)

## 2019-04-30 NOTE — Progress Notes (Signed)
Carelink Summary Report / Loop Recorder 

## 2019-05-05 ENCOUNTER — Telehealth: Payer: Self-pay | Admitting: Cardiovascular Disease

## 2019-05-05 NOTE — Telephone Encounter (Signed)
Spoke w/ pt and she wasn't at home to help her send transmission. She stated that she will call tech support tonight to get help sending transmission.

## 2019-05-05 NOTE — Telephone Encounter (Signed)
New message   Patient would like a call to discuss a remote transmission.  Please call.

## 2019-05-06 NOTE — Telephone Encounter (Signed)
Attempted to call pt. No answer and unable to leave a message voicemail is full.

## 2019-05-07 ENCOUNTER — Telehealth: Payer: Self-pay

## 2019-05-07 ENCOUNTER — Encounter: Payer: Self-pay | Admitting: Family Medicine

## 2019-05-07 ENCOUNTER — Ambulatory Visit (INDEPENDENT_AMBULATORY_CARE_PROVIDER_SITE_OTHER): Payer: Medicare Other | Admitting: Family Medicine

## 2019-05-07 ENCOUNTER — Other Ambulatory Visit: Payer: Self-pay

## 2019-05-07 DIAGNOSIS — G4733 Obstructive sleep apnea (adult) (pediatric): Secondary | ICD-10-CM | POA: Diagnosis not present

## 2019-05-07 DIAGNOSIS — Z9989 Dependence on other enabling machines and devices: Secondary | ICD-10-CM

## 2019-05-07 NOTE — Progress Notes (Addendum)
PATIENT: Kathleen [name] DOB: [birth year]  REASON FOR VISIT: follow up HISTORY FROM: patient  Virtual Visit via Telephone Note  I connected with Kathleen [name] on 05/07/19 at  2:30 PM EDT by telephone and verified that I am speaking with the correct person using two identifiers.   I discussed the limitations, risks, security and privacy concerns of performing an evaluation and management service by telephone and the availability of in person appointments. I also discussed with the patient that there may be a patient responsible charge related to this service. The patient expressed understanding and agreed to proceed.   History of Present Illness:  05/07/19 Kathleen [name] is a 58 y.o. female here today for follow up of OSA on CPAP.  RT reports that she is doing very well on CPAP therapy.  She reports that she is using it every night.  She does state that she got a call 2 days ago from the Kathleen [name] stating that no data was being transferred.  We are unable to locate a download report for today's visit.  She is adamant that she is using her machine nightly.  She reports that she made a promise to Dr. Rexene [name] and she is working hard to hold true to her word.  She does feel that she sleeps better with CPAP therapy.  HISTORY OF PRESENT ILLNESS: Kathleen [name] (Kathleen [name]) is a 58 year old right-handed woman with an underlying medical history of HTN, cerebellar stroke in June 2018, hyperlipidemia, vitamin D deficiency and morbid obesity, who presents for follow-up consultation of her obstructive sleep apnea, after sleep study testing. The patient is unaccompanied today. I first met her on 10/02/2017 at the request of Dr. Leta [name], at which time she reported snoring and excessive daytime somnolence. I suggested we proceed with sleep study testing. She had a baseline sleep study, followed by a CPAP titration study. I went over her test results with her in detail today. Baseline sleep study from  10/31/2017 showed a sleep latency of 38 minutes, REM latency was delayed at 240 minutes, sleep efficiency reduced at 66.2%. She had an increased percentage of stage I sleep, and a significantly reduced percentage of REM sleep. Total AHI was 60 per hour, REM AHI was 87 per hour, supine AHI was 100.2 per hour. Average oxygen saturation was 93%, nadir was 71%. Based on her test results I suggested we proceed with a CPAP titration study. She had this on 01/21/2018. Sleep efficiency was 94.4%, sleep latency was 6.5 minutes REM latency 126.5 minutes. She had an increased percentage of slow-wave sleep and REM sleep. She was fitted with nasal pillows and CPAP was initiated at 6 cm and advanced 12 cm. At a pressure of 12 cm her AHI was reduced to 0 per hour with supine REM sleep achieved an O2 nadir of 91%. Based on her test results I prescribed CPAP therapy of 12 cm for home use.  Today, 04/16/2018: I reviewed her CPAP compliance data from the last 30 days but she is no longer using her CPAP, left a registered wears 03/22/2018. She was very consistent with her CPAP usage in the beginning, in the month of March through mid April she had a compliance percentage of 70%, from 02/20/2018 through 03/21/2018 she used her CPAP 24 out of 30 days with a percent used days greater than 4 hours) 70%, indicating compliance. Average usage was 6 hours and 56 minutes, residual AHI at goal at 0.8 per hour, leak in the low/acceptable  range with the 95thpercentile at 8.7 L/m on a pressure of 12 cm with EPR of 3. She reports that she feels she needs a new mask. She has not called her DME company. She had interim weight gain, but stable since last visit with Dr. Leta [name] in March 2019. Of note, she had some interim weight gain. She interim loop recorder insertion in November, after having a TEE. She admits that she has not used her CPAP in the past couple weeks. She reports that she felt she needed a new mask. When she first started using  CPAP and was consistent with the usage, she did notice an improvement in her daytime somnolence and sleep quality.  UPDATE 11/4/2019CM Kathleen [name], 58 year old female returns for follow-up with history of obstructive sleep apnea which is severe.  She admits that she does not use her CPAP every night.  Compliance data dated 09/06/2018-10/05/2018 shows compliance greater than 4 hours at 20% or 6 days.  Average usage 7 hours 20 minutes.  Set pressure 12 cm EPR level 3 leak 95th percentile 11.6 AHI 0.3.  She says she needs a new mask but has not called her DME company.  She has a history of stroke in June 2018.  She claims she is [name] to get routine labs with her primary care in the next week or so.  She returns for reevaluation  UPDATE 3/4/2020CM Kathleen [name], 58 year old female returns for follow-up with history of obstructive sleep apnea which is severe.  She is here for CPAP compliance she continues to have poor compliance.  Data dated 12/05/2018-02/02/2019 shows compliance greater than 4 hours at 37% for 22 days less than 4 hours 12% for 7 days for total compliance 29 out of 60 days at 48%.  Average usage 6 hours set pressure 12 leak 95th percentile 5.2 AHI 0.8 ESS 1.  She returns for reevaluation   Observations/Objective:  Generalized: Well developed, in no acute distress  Mentation: Alert oriented to time, place, history taking. Follows all commands speech and language fluent   Assessment and Plan:  58 y.o. year old female  has a past medical history of Anxiety, Asthma, Hypertension, Obesity, and Stroke (Kathleen [name]) (05/2017). here with    ICD-10-CM   1. OSA on CPAP G47.33    Z99.89    Unfortunately we do not have a download to review today for compliance.  She states that there must be something wrong with her machine as she received a call from the DME company 2 days ago.  She is adamant that she is using her machine every night.  She reports average usage is 4 to 6 hours.  She definitely notes benefit  of CPAP therapy.  She was advised that we will reach out to area care to troubleshoot compliance data.  I will addend this note once compliance data received.  Patient verbalizes understanding and agreement with this plan.  No orders of the defined types were placed in this encounter.   No orders of the defined types were placed in this encounter.   Addended note: Compliance download report dated 04/07/2019 through 05/06/2019 reveals that she is using her CPAP machine 26 last 30 days for compliance of 87% 23 of these days she used her machine greater than 4 hours for compliance of 77%.  AHI was 0.6 on 12 cm of water and EPR of 3.  There was no significant leak.  Follow Up Instructions:  I discussed the assessment and treatment plan with the patient. The  patient was provided an opportunity to ask questions and all were answered. The patient agreed with the plan and demonstrated an understanding of the instructions.   The patient was advised to call back or seek an in-person evaluation if the symptoms worsen or if the condition fails to improve as anticipated.  I provided 20 minutes of non-face-to-face time during this encounter.  Patient is located at her place of residence during video conference.  Provider is located at the office.  Maryelizabeth Kaufmann, CMA helped to facilitate visit.   Debbora Presto, NP   I reviewed the above note and documentation by the Nurse Practitioner and agree with the history, exam, assessment and plan as outlined above. I was immediately available for consultation. Star Age, MD, PhD [facility])

## 2019-05-07 NOTE — Telephone Encounter (Signed)
Unable to get a cpap report for the patient on [facility]. I have contacted Margreta Journey and Janett Billow at [employer] about this issues. Waiting for them to tag the office on [place] so that I can print the report off.

## 2019-05-07 NOTE — Telephone Encounter (Signed)
Attempted to call pt. No answer and unable to leave a voice mail.

## 2019-05-08 NOTE — Telephone Encounter (Signed)
Spoke w/ pt and she informed me that she tired to call tech support but the office was closed. Instructed pt that I would send a message to [facility] to call her in 1-2 business days and that a 1-800 number will be calling for them. She verbalized understanding.

## 2019-05-14 DIAGNOSIS — Z720 Tobacco use: Secondary | ICD-10-CM | POA: Diagnosis not present

## 2019-05-14 DIAGNOSIS — M170 Bilateral primary osteoarthritis of knee: Secondary | ICD-10-CM | POA: Diagnosis not present

## 2019-05-14 DIAGNOSIS — M25512 Pain in left shoulder: Secondary | ICD-10-CM | POA: Diagnosis not present

## 2019-05-14 DIAGNOSIS — I10 Essential (primary) hypertension: Secondary | ICD-10-CM | POA: Diagnosis not present

## 2019-05-14 DIAGNOSIS — E213 Hyperparathyroidism, unspecified: Secondary | ICD-10-CM | POA: Diagnosis not present

## 2019-05-14 DIAGNOSIS — R7303 Prediabetes: Secondary | ICD-10-CM | POA: Diagnosis not present

## 2019-05-15 ENCOUNTER — Telehealth: Payer: Self-pay

## 2019-05-15 NOTE — Telephone Encounter (Signed)
I spoke with the pt and [employer] tech support called her. They are sending her a new monitor. She will receive the new monitor within 7-10 business days. The pt states she will call us when she receives her new monitor to send a transmission.

## 2019-05-18 ENCOUNTER — Ambulatory Visit (INDEPENDENT_AMBULATORY_CARE_PROVIDER_SITE_OTHER): Payer: Medicare Other | Admitting: *Deleted

## 2019-05-18 ENCOUNTER — Telehealth: Payer: Self-pay | Admitting: Family Medicine

## 2019-05-18 DIAGNOSIS — I639 Cerebral infarction, unspecified: Secondary | ICD-10-CM | POA: Diagnosis not present

## 2019-05-18 LAB — CUP PACEART REMOTE DEVICE CHECK
Date Time Interrogation Session: 20200614064403
Implantable Pulse Generator Implant Date: 20181106

## 2019-05-18 NOTE — Telephone Encounter (Signed)
Pt called in and stated she seen her PCP due to stomach pain and feeling as if she has some trapped gas, and her PCP advised her to contact [facility] and ask to turn machine down it seems as if she may be getting to much air.

## 2019-05-19 ENCOUNTER — Other Ambulatory Visit: Payer: Self-pay | Admitting: Family Medicine

## 2019-05-19 DIAGNOSIS — G4733 Obstructive sleep apnea (adult) (pediatric): Secondary | ICD-10-CM

## 2019-05-19 DIAGNOSIS — Z9989 Dependence on other enabling machines and devices: Secondary | ICD-10-CM

## 2019-05-19 NOTE — Telephone Encounter (Signed)
I called pt and relayed the recommendations of reducing pressure of cpap to 11cm. Will send order message to [facility].  Pt will let us know how she does or if continues with problems.  44mo RV made 08-20-19 at 1030.  Pt verbalized understanding.

## 2019-05-19 NOTE — Telephone Encounter (Signed)
I will send an order to her DME to reduce pressure to 11cmH2O. Have her follow up in 3 months, sooner if symptoms persist.

## 2019-05-25 DIAGNOSIS — M25512 Pain in left shoulder: Secondary | ICD-10-CM | POA: Diagnosis not present

## 2019-05-25 DIAGNOSIS — M170 Bilateral primary osteoarthritis of knee: Secondary | ICD-10-CM | POA: Diagnosis not present

## 2019-05-25 NOTE — Telephone Encounter (Signed)
Transmission received 05-19-2019

## 2019-05-26 NOTE — Telephone Encounter (Signed)
Manual transmission reviewed. 2 "AF" episodes noted, both from 02/2019 but were not previously transmitted. Routed to Dr. Rayann Heman for review as EGMs were not available on previous summary reports.  Episode from 04/25/19, duration 58min:     Episode from 04/08/19, duration 72min:

## 2019-05-26 NOTE — Progress Notes (Signed)
Carelink Summary Report / Loop Recorder 

## 2019-05-31 NOTE — Telephone Encounter (Signed)
Appears to be sinus with artifact.  I do not see true AF.

## 2019-06-04 DIAGNOSIS — R7303 Prediabetes: Secondary | ICD-10-CM | POA: Diagnosis not present

## 2019-06-04 DIAGNOSIS — E213 Hyperparathyroidism, unspecified: Secondary | ICD-10-CM | POA: Diagnosis not present

## 2019-06-04 DIAGNOSIS — Z720 Tobacco use: Secondary | ICD-10-CM | POA: Diagnosis not present

## 2019-06-04 DIAGNOSIS — M170 Bilateral primary osteoarthritis of knee: Secondary | ICD-10-CM | POA: Diagnosis not present

## 2019-06-04 DIAGNOSIS — I10 Essential (primary) hypertension: Secondary | ICD-10-CM | POA: Diagnosis not present

## 2019-06-19 ENCOUNTER — Ambulatory Visit (INDEPENDENT_AMBULATORY_CARE_PROVIDER_SITE_OTHER): Payer: Medicare Other | Admitting: *Deleted

## 2019-06-19 DIAGNOSIS — I639 Cerebral infarction, unspecified: Secondary | ICD-10-CM | POA: Diagnosis not present

## 2019-06-19 LAB — CUP PACEART REMOTE DEVICE CHECK
Date Time Interrogation Session: 20200717062400
Implantable Pulse Generator Implant Date: 20181106

## 2019-06-24 NOTE — Progress Notes (Signed)
Carelink Summary Report / Loop Recorder 

## 2019-07-20 DIAGNOSIS — R1013 Epigastric pain: Secondary | ICD-10-CM | POA: Diagnosis not present

## 2019-07-23 ENCOUNTER — Ambulatory Visit (INDEPENDENT_AMBULATORY_CARE_PROVIDER_SITE_OTHER): Payer: Medicare Other | Admitting: *Deleted

## 2019-07-23 DIAGNOSIS — I639 Cerebral infarction, unspecified: Secondary | ICD-10-CM | POA: Diagnosis not present

## 2019-07-23 DIAGNOSIS — G4733 Obstructive sleep apnea (adult) (pediatric): Secondary | ICD-10-CM | POA: Diagnosis not present

## 2019-07-23 LAB — CUP PACEART REMOTE DEVICE CHECK
Date Time Interrogation Session: 20200819114125
Implantable Pulse Generator Implant Date: 20181106

## 2019-07-31 NOTE — Progress Notes (Signed)
Carelink Summary Report / Loop Recorder 

## 2019-08-20 ENCOUNTER — Telehealth: Payer: Self-pay

## 2019-08-20 ENCOUNTER — Ambulatory Visit: Payer: Self-pay | Admitting: Family Medicine

## 2019-08-20 NOTE — Telephone Encounter (Signed)
Patient was a no call/no show for their appointment today.   

## 2019-08-24 ENCOUNTER — Encounter: Payer: Self-pay | Admitting: Family Medicine

## 2019-08-24 ENCOUNTER — Ambulatory Visit (INDEPENDENT_AMBULATORY_CARE_PROVIDER_SITE_OTHER): Payer: Medicare Other | Admitting: *Deleted

## 2019-08-24 DIAGNOSIS — I639 Cerebral infarction, unspecified: Secondary | ICD-10-CM | POA: Diagnosis not present

## 2019-08-25 LAB — CUP PACEART REMOTE DEVICE CHECK
Date Time Interrogation Session: 20200921134128
Implantable Pulse Generator Implant Date: 20181106

## 2019-09-01 NOTE — Progress Notes (Signed)
Carelink Summary Report / Loop Recorder 

## 2019-09-21 DIAGNOSIS — M170 Bilateral primary osteoarthritis of knee: Secondary | ICD-10-CM | POA: Diagnosis not present

## 2019-09-21 DIAGNOSIS — E213 Hyperparathyroidism, unspecified: Secondary | ICD-10-CM | POA: Diagnosis not present

## 2019-09-21 DIAGNOSIS — R7303 Prediabetes: Secondary | ICD-10-CM | POA: Diagnosis not present

## 2019-09-21 DIAGNOSIS — I10 Essential (primary) hypertension: Secondary | ICD-10-CM | POA: Diagnosis not present

## 2019-09-27 LAB — CUP PACEART REMOTE DEVICE CHECK
Date Time Interrogation Session: 20201024133842
Implantable Pulse Generator Implant Date: 20181106

## 2019-09-28 ENCOUNTER — Ambulatory Visit (INDEPENDENT_AMBULATORY_CARE_PROVIDER_SITE_OTHER): Payer: Medicare Other | Admitting: *Deleted

## 2019-09-28 DIAGNOSIS — I639 Cerebral infarction, unspecified: Secondary | ICD-10-CM

## 2019-10-16 NOTE — Progress Notes (Signed)
Carelink Summary Report / Loop Recorder 

## 2019-10-23 DIAGNOSIS — G4733 Obstructive sleep apnea (adult) (pediatric): Secondary | ICD-10-CM | POA: Diagnosis not present

## 2019-11-01 LAB — CUP PACEART REMOTE DEVICE CHECK
Date Time Interrogation Session: 20201126084042
Implantable Pulse Generator Implant Date: 20181106

## 2019-11-02 ENCOUNTER — Ambulatory Visit (INDEPENDENT_AMBULATORY_CARE_PROVIDER_SITE_OTHER): Payer: Medicare Other | Admitting: *Deleted

## 2019-11-02 DIAGNOSIS — Z0110 Encounter for examination of ears and hearing without abnormal findings: Secondary | ICD-10-CM | POA: Diagnosis not present

## 2019-11-02 DIAGNOSIS — Z0000 Encounter for general adult medical examination without abnormal findings: Secondary | ICD-10-CM | POA: Diagnosis not present

## 2019-11-02 DIAGNOSIS — Z136 Encounter for screening for cardiovascular disorders: Secondary | ICD-10-CM | POA: Diagnosis not present

## 2019-11-02 DIAGNOSIS — Z131 Encounter for screening for diabetes mellitus: Secondary | ICD-10-CM | POA: Diagnosis not present

## 2019-11-02 DIAGNOSIS — I639 Cerebral infarction, unspecified: Secondary | ICD-10-CM | POA: Diagnosis not present

## 2019-11-02 DIAGNOSIS — Z0100 Encounter for examination of eyes and vision without abnormal findings: Secondary | ICD-10-CM | POA: Diagnosis not present

## 2019-11-02 DIAGNOSIS — Z23 Encounter for immunization: Secondary | ICD-10-CM | POA: Diagnosis not present

## 2019-11-25 NOTE — Progress Notes (Signed)
ILR remote 

## 2019-12-03 ENCOUNTER — Ambulatory Visit (INDEPENDENT_AMBULATORY_CARE_PROVIDER_SITE_OTHER): Payer: Medicare Other | Admitting: *Deleted

## 2019-12-03 DIAGNOSIS — I639 Cerebral infarction, unspecified: Secondary | ICD-10-CM

## 2019-12-03 LAB — CUP PACEART REMOTE DEVICE CHECK
Date Time Interrogation Session: 20201229084703
Implantable Pulse Generator Implant Date: 20181106

## 2019-12-14 DIAGNOSIS — E213 Hyperparathyroidism, unspecified: Secondary | ICD-10-CM | POA: Diagnosis not present

## 2019-12-14 DIAGNOSIS — E782 Mixed hyperlipidemia: Secondary | ICD-10-CM | POA: Diagnosis not present

## 2019-12-14 DIAGNOSIS — I10 Essential (primary) hypertension: Secondary | ICD-10-CM | POA: Diagnosis not present

## 2019-12-14 DIAGNOSIS — K439 Ventral hernia without obstruction or gangrene: Secondary | ICD-10-CM | POA: Diagnosis not present

## 2020-01-04 ENCOUNTER — Ambulatory Visit (INDEPENDENT_AMBULATORY_CARE_PROVIDER_SITE_OTHER): Payer: Medicare Other | Admitting: *Deleted

## 2020-01-04 DIAGNOSIS — I639 Cerebral infarction, unspecified: Secondary | ICD-10-CM

## 2020-01-04 DIAGNOSIS — I10 Essential (primary) hypertension: Secondary | ICD-10-CM | POA: Diagnosis not present

## 2020-01-04 DIAGNOSIS — Z0001 Encounter for general adult medical examination with abnormal findings: Secondary | ICD-10-CM | POA: Diagnosis not present

## 2020-01-04 DIAGNOSIS — K439 Ventral hernia without obstruction or gangrene: Secondary | ICD-10-CM | POA: Diagnosis not present

## 2020-01-04 DIAGNOSIS — E213 Hyperparathyroidism, unspecified: Secondary | ICD-10-CM | POA: Diagnosis not present

## 2020-01-04 LAB — CUP PACEART REMOTE DEVICE CHECK
Date Time Interrogation Session: 20210201002724
Implantable Pulse Generator Implant Date: 20181106

## 2020-01-04 NOTE — Progress Notes (Signed)
ILR Remote 

## 2020-01-05 ENCOUNTER — Ambulatory Visit: Payer: Self-pay

## 2020-01-05 ENCOUNTER — Encounter: Payer: Self-pay | Admitting: Orthopaedic Surgery

## 2020-01-05 ENCOUNTER — Other Ambulatory Visit: Payer: Self-pay

## 2020-01-05 ENCOUNTER — Ambulatory Visit (INDEPENDENT_AMBULATORY_CARE_PROVIDER_SITE_OTHER): Payer: Medicare Other | Admitting: Orthopaedic Surgery

## 2020-01-05 DIAGNOSIS — M170 Bilateral primary osteoarthritis of knee: Secondary | ICD-10-CM | POA: Diagnosis not present

## 2020-01-05 MED ORDER — CYCLOBENZAPRINE HCL 10 MG PO TABS: 10.0000 mg | ORAL_TABLET | Freq: Two times a day (BID) | ORAL | 0 refills | Status: AC | PRN

## 2020-01-05 NOTE — Progress Notes (Signed)
Office Visit Note   Patient: Kathleen [name]           Date of Birth: Apr 10, 1961           MRN: [id number] Visit Date: 01/05/2020              Requested by: Benito Mccreedy, MD [id number] [place],  [place] [address] PCP: Benito Mccreedy, MD   Assessment & Plan: Visit Diagnoses:  1. Bilateral primary osteoarthritis of knee     Plan: Impression is bilateral knee advanced osteoarthritis right greater than left.  The patient has exhausted conservative treatment to include cortisone injections.  She would like to proceed with definitive treatment of bilateral sequential total knee replacements.  She is currently working on losing weight and has gone from 344 pounds to 288.  She does need to get to 245 pounds which would put her at a BMI of just under 40.  She is aware and agrees.  She will follow-up with [place] once she meets that goal.  Follow-Up Instructions: Return if symptoms worsen or fail to improve.   Orders:  Orders Placed This Encounter  Procedures  . XR KNEE 3 VIEW LEFT  . XR KNEE 3 VIEW RIGHT   Meds ordered this encounter  Medications  . cyclobenzaprine (FLEXERIL) 10 MG tablet    Sig: Take 1 tablet (10 mg total) by mouth 2 (two) times daily as needed for muscle spasms.    Dispense:  30 tablet    Refill:  0      Procedures: No procedures performed   Clinical Data: No additional findings.   Subjective: Chief Complaint  Patient presents with  . Left Knee - Pain  . Right Knee - Pain    HPI patient is a pleasant 59 year old female who comes in today with recurrent bilateral knee pain right greater than left.  Both knees have been bothering her for over 5 years.  She describes this as a constant throb with associated night pain.  Her pain is aggravated with ambulation as well as with going up and down stairs.  She does get mild relief with Flexeril as well as sleeping with a pillow between her legs.  She has had multiple cortisone injections to  both knees without long-lasting relief.  No previous viscosupplementation injection.  Review of Systems as detailed in HPI.  All others reviewed and are negative.   Objective: Vital Signs: There were no vitals taken for this visit.  Physical Exam well-developed well-nourished female no acute distress.  Alert oriented x3.  Ortho Exam examination of both knees reveals a trace effusion.  Range of motion 0 to 100 degrees.  Marked patellofemoral crepitus.  Medial joint line tenderness.  Ligaments are stable.  She is neurovascular intact distally.  Specialty Comments:  No specialty comments available.  Imaging: XR KNEE 3 VIEW LEFT  Result Date: 01/05/2020 Marked tricompartmental degenerative changes   XR KNEE 3 VIEW RIGHT  Result Date: 01/05/2020 Marked tricompartmental degenerative changes    PMFS History: Patient Active Problem List   Diagnosis Date Noted  . Obstructive sleep apnea treated with continuous positive airway pressure (CPAP) 10/06/2018  . Obesity, Class III, BMI 40-49.9 (morbid obesity) ([place])   . Hyperlipidemia   . Tobacco abuse   . Cerebral embolism with cerebral infarction 05/17/2017  . Cryptogenic stroke ([place]) 05/17/2017  . Nausea & vomiting 05/17/2017  . Hypertension 05/16/2017  . DJD (degenerative joint disease) of knee 07/01/2014   Past Medical  History:  Diagnosis Date  . Anxiety   . Asthma   . Hypertension    NO MED MD TO GIVE RX  . Obesity   . Stroke [facility]) 05/2017    Family History  Problem Relation Age of Onset  . Hypertension Father   . Diabetes Father     Past Surgical History:  Procedure Laterality Date  . CARPAL TUNNEL RELEASE  01/12/2012   Procedure: CARPAL TUNNEL RELEASE;  Surgeon: Schuyler Amor, MD;  Location: [place];  Service: Orthopedics;  Laterality: Right;  . CESAREAN SECTION    . LOOP RECORDER INSERTION N/A 10/08/2017   Procedure: LOOP RECORDER INSERTION;  Surgeon: Thompson Grayer, MD;  Location: [place] CV LAB;  Service:  Cardiovascular;  Laterality: N/A;  . NOSE SURGERY     BROKEN   MANY YRS AGO   . TEE WITHOUT CARDIOVERSION N/A 10/08/2017   Procedure: TRANSESOPHAGEAL ECHOCARDIOGRAM (TEE);  Surgeon: Sanda Klein, MD;  Location: [place];  Service: Cardiovascular;  Laterality: N/A;  . TUBAL LIGATION    . WRIST FRACTURE SURGERY  Feb 2013   Social History   Occupational History  . Not on file  Tobacco Use  . Smoking status: Former Smoker    Quit date: 05/17/2017    Years since quitting: 2.6  . Smokeless tobacco: Never Used  . Tobacco comment: Smoking about 6 cigs/week  Substance and Sexual Activity  . Alcohol use: Yes    Alcohol/week: 21.0 standard drinks    Types: 21 Glasses of wine per week    Comment: DAILY ALCOHOL and WINE  . Drug use: No  . Sexual activity: Yes    Birth control/protection: None

## 2020-01-07 DIAGNOSIS — M6208 Separation of muscle (nontraumatic), other site: Secondary | ICD-10-CM | POA: Diagnosis not present

## 2020-01-23 DIAGNOSIS — G4733 Obstructive sleep apnea (adult) (pediatric): Secondary | ICD-10-CM | POA: Diagnosis not present

## 2020-02-03 DIAGNOSIS — I10 Essential (primary) hypertension: Secondary | ICD-10-CM | POA: Diagnosis not present

## 2020-02-03 DIAGNOSIS — K439 Ventral hernia without obstruction or gangrene: Secondary | ICD-10-CM | POA: Diagnosis not present

## 2020-02-03 DIAGNOSIS — E213 Hyperparathyroidism, unspecified: Secondary | ICD-10-CM | POA: Diagnosis not present

## 2020-02-03 DIAGNOSIS — E782 Mixed hyperlipidemia: Secondary | ICD-10-CM | POA: Diagnosis not present

## 2020-02-04 ENCOUNTER — Ambulatory Visit (INDEPENDENT_AMBULATORY_CARE_PROVIDER_SITE_OTHER): Payer: Medicare Other | Admitting: *Deleted

## 2020-02-04 DIAGNOSIS — I639 Cerebral infarction, unspecified: Secondary | ICD-10-CM

## 2020-02-04 LAB — CUP PACEART REMOTE DEVICE CHECK
Date Time Interrogation Session: 20210304030922
Implantable Pulse Generator Implant Date: 20181106

## 2020-02-04 NOTE — Progress Notes (Signed)
ILR Remote 

## 2020-02-22 DIAGNOSIS — K439 Ventral hernia without obstruction or gangrene: Secondary | ICD-10-CM | POA: Diagnosis not present

## 2020-02-22 DIAGNOSIS — M25562 Pain in left knee: Secondary | ICD-10-CM | POA: Diagnosis not present

## 2020-02-22 DIAGNOSIS — I10 Essential (primary) hypertension: Secondary | ICD-10-CM | POA: Diagnosis not present

## 2020-02-22 DIAGNOSIS — S82435A Nondisplaced oblique fracture of shaft of left fibula, initial encounter for closed fracture: Secondary | ICD-10-CM | POA: Diagnosis not present

## 2020-02-23 DIAGNOSIS — M25572 Pain in left ankle and joints of left foot: Secondary | ICD-10-CM | POA: Diagnosis not present

## 2020-02-26 ENCOUNTER — Ambulatory Visit: Payer: Medicare Other | Attending: Internal Medicine

## 2020-02-26 DIAGNOSIS — Z23 Encounter for immunization: Secondary | ICD-10-CM

## 2020-02-26 DIAGNOSIS — S82832A Other fracture of upper and lower end of left fibula, initial encounter for closed fracture: Secondary | ICD-10-CM | POA: Diagnosis not present

## 2020-02-26 NOTE — Progress Notes (Signed)
   Covid-19 Vaccination Clinic  Name:  Kathleen [name]    MRN: [id number] DOB: 1961/05/02  02/26/2020  [name] was observed post Covid-19 immunization for 15 minutes without incident. She was provided with Vaccine Information Sheet and instruction to access the V-Safe system.   [name] was instructed to call 911 with any severe reactions post vaccine: Marland Kitchen Difficulty breathing  . Swelling of face and throat  . A fast heartbeat  . A bad rash all over body  . Dizziness and weakness   Immunizations Administered    Name Date Dose VIS Date Route   Pfizer COVID-19 Vaccine 02/26/2020  3:19 PM 0.3 mL 11/13/2019 Intramuscular   Manufacturer: [place]   Lot: [id number]   [place]: [id number]

## 2020-03-01 DIAGNOSIS — S82832A Other fracture of upper and lower end of left fibula, initial encounter for closed fracture: Secondary | ICD-10-CM | POA: Diagnosis not present

## 2020-03-07 ENCOUNTER — Ambulatory Visit (INDEPENDENT_AMBULATORY_CARE_PROVIDER_SITE_OTHER): Payer: Medicare Other | Admitting: *Deleted

## 2020-03-07 DIAGNOSIS — I639 Cerebral infarction, unspecified: Secondary | ICD-10-CM

## 2020-03-07 LAB — CUP PACEART REMOTE DEVICE CHECK
Date Time Interrogation Session: 20210404041202
Implantable Pulse Generator Implant Date: 20181106

## 2020-03-08 NOTE — Progress Notes (Signed)
ILR Remote 

## 2020-03-10 ENCOUNTER — Telehealth: Payer: Self-pay

## 2020-03-10 NOTE — Telephone Encounter (Signed)
The pt states she keep getting a question mark when she tries to send the transmission. I gave her the number to tech support to get additional help. The pt agreed to call me tomorrow to let me know what tech support tell her.

## 2020-03-15 NOTE — Telephone Encounter (Signed)
LMOVM to f/u if she called tech support to get help with her monitor.

## 2020-03-16 NOTE — Telephone Encounter (Signed)
LMOVM

## 2020-03-17 DIAGNOSIS — S82832A Other fracture of upper and lower end of left fibula, initial encounter for closed fracture: Secondary | ICD-10-CM | POA: Diagnosis not present

## 2020-03-18 NOTE — Telephone Encounter (Signed)
Letter sent 03-17-2020

## 2020-03-22 ENCOUNTER — Ambulatory Visit: Payer: Medicare Other | Attending: Internal Medicine

## 2020-03-22 DIAGNOSIS — Z23 Encounter for immunization: Secondary | ICD-10-CM

## 2020-03-22 NOTE — Progress Notes (Signed)
   Covid-19 Vaccination Clinic  Name:  Kathleen [name]    MRN: [id number] DOB: Sep 12, 1961  03/22/2020  [name] was observed post Covid-19 immunization for 15 minutes without incident. She was provided with Vaccine Information Sheet and instruction to access the V-Safe system.   [name] was instructed to call 911 with any severe reactions post vaccine: Marland Kitchen Difficulty breathing  . Swelling of face and throat  . A fast heartbeat  . A bad rash all over body  . Dizziness and weakness   Immunizations Administered    Name Date Dose VIS Date Route   Pfizer COVID-19 Vaccine 03/22/2020 12:06 PM 0.3 mL 01/27/2019 Intramuscular   Manufacturer: [place]   Lot: [id number]   [place]: [id number]

## 2020-03-23 NOTE — Telephone Encounter (Signed)
[employer] alert received- 2 more possible AF episodes, no EGMs avail, need manual transmission.  Attempted to reach patient to have her send transmission.  No answer, left VM with DC phone # to callback if assistance is needed with sending transmission

## 2020-03-30 NOTE — Telephone Encounter (Signed)
Called patient, states her monitor is working and she will send a manual transmission when she gest home after 5:00 PM.  Patient does recall during the time of [birth year] her birthday she was fatigue those past few days. States she fell 02/23/20 and fx left tibula. Currently taking ASA 325 mg BID. Patient denies any symptoms or complaints at this time.

## 2020-03-31 DIAGNOSIS — S82832A Other fracture of upper and lower end of left fibula, initial encounter for closed fracture: Secondary | ICD-10-CM | POA: Diagnosis not present

## 2020-03-31 NOTE — Telephone Encounter (Signed)
Transmission received, both AF episodes 2 minutes in duration and likely false. 1 episode with ? Brief nocturnal heart block. Per phone note, no symptoms recently, will continue to monitor  Chanetta Marshall, NP 03/31/2020 9:11 AM

## 2020-04-06 ENCOUNTER — Ambulatory Visit (INDEPENDENT_AMBULATORY_CARE_PROVIDER_SITE_OTHER): Payer: Medicare Other | Admitting: *Deleted

## 2020-04-06 DIAGNOSIS — I639 Cerebral infarction, unspecified: Secondary | ICD-10-CM | POA: Diagnosis not present

## 2020-04-07 LAB — CUP PACEART REMOTE DEVICE CHECK
Date Time Interrogation Session: 20210505041611
Implantable Pulse Generator Implant Date: 20181106

## 2020-04-07 NOTE — Progress Notes (Signed)
Carelink Summary Report / Loop Recorder 

## 2020-04-19 DIAGNOSIS — S82832A Other fracture of upper and lower end of left fibula, initial encounter for closed fracture: Secondary | ICD-10-CM | POA: Diagnosis not present

## 2020-04-28 DIAGNOSIS — G4733 Obstructive sleep apnea (adult) (pediatric): Secondary | ICD-10-CM | POA: Diagnosis not present

## 2020-05-09 ENCOUNTER — Ambulatory Visit (INDEPENDENT_AMBULATORY_CARE_PROVIDER_SITE_OTHER): Payer: Medicare Other | Admitting: *Deleted

## 2020-05-09 DIAGNOSIS — I639 Cerebral infarction, unspecified: Secondary | ICD-10-CM | POA: Diagnosis not present

## 2020-05-09 LAB — CUP PACEART REMOTE DEVICE CHECK
Date Time Interrogation Session: 20210606231509
Implantable Pulse Generator Implant Date: 20181106

## 2020-05-11 NOTE — Progress Notes (Signed)
Carelink Summary Report / Loop Recorder 

## 2020-06-09 ENCOUNTER — Ambulatory Visit (INDEPENDENT_AMBULATORY_CARE_PROVIDER_SITE_OTHER): Payer: Medicare Other | Admitting: *Deleted

## 2020-06-09 DIAGNOSIS — I63112 Cerebral infarction due to embolism of left vertebral artery: Secondary | ICD-10-CM

## 2020-06-09 LAB — CUP PACEART REMOTE DEVICE CHECK
Date Time Interrogation Session: 20210707232100
Implantable Pulse Generator Implant Date: 20181106

## 2020-06-10 DIAGNOSIS — R05 Cough: Secondary | ICD-10-CM | POA: Diagnosis not present

## 2020-06-10 DIAGNOSIS — K439 Ventral hernia without obstruction or gangrene: Secondary | ICD-10-CM | POA: Diagnosis not present

## 2020-06-10 DIAGNOSIS — E213 Hyperparathyroidism, unspecified: Secondary | ICD-10-CM | POA: Diagnosis not present

## 2020-06-10 DIAGNOSIS — I10 Essential (primary) hypertension: Secondary | ICD-10-CM | POA: Diagnosis not present

## 2020-06-13 NOTE — Progress Notes (Signed)
Carelink Summary Report / Loop Recorder 

## 2020-06-21 ENCOUNTER — Telehealth: Payer: Self-pay

## 2020-06-21 NOTE — Telephone Encounter (Signed)
Alert for AF event, no details available.  Pt implanted for cryptogenic stroke, no documetned AF, not on [place].  Has history of false AF episodes (#1-4).  Need manual transmission to obtain details for episode #5.  Attempted to reach pt, no answer, VM is full.

## 2020-06-22 NOTE — Telephone Encounter (Signed)
Second attempt to reach patient;  Mailbox is full.

## 2020-07-04 NOTE — Telephone Encounter (Signed)
The pt agreed to send a manual transmission with her home monitor after 6 pm. I gave her the number to tech  support in case she needs additional help.

## 2020-07-05 NOTE — Telephone Encounter (Signed)
[facility] report received. Appears SR w/ PAC's.   Will route to Dr. Rayann Heman for clarification.

## 2020-07-10 NOTE — Telephone Encounter (Signed)
I agree that this is sinus with PACs.

## 2020-07-11 ENCOUNTER — Ambulatory Visit (INDEPENDENT_AMBULATORY_CARE_PROVIDER_SITE_OTHER): Payer: Medicare Other | Admitting: *Deleted

## 2020-07-11 DIAGNOSIS — I639 Cerebral infarction, unspecified: Secondary | ICD-10-CM

## 2020-07-13 LAB — CUP PACEART REMOTE DEVICE CHECK
Date Time Interrogation Session: 20210809232053
Implantable Pulse Generator Implant Date: 20181106

## 2020-07-14 NOTE — Progress Notes (Signed)
Carelink Summary Report / Loop Recorder 

## 2020-08-11 ENCOUNTER — Ambulatory Visit (INDEPENDENT_AMBULATORY_CARE_PROVIDER_SITE_OTHER): Payer: Medicare Other | Admitting: *Deleted

## 2020-08-11 DIAGNOSIS — I639 Cerebral infarction, unspecified: Secondary | ICD-10-CM | POA: Diagnosis not present

## 2020-08-11 DIAGNOSIS — G4733 Obstructive sleep apnea (adult) (pediatric): Secondary | ICD-10-CM | POA: Diagnosis not present

## 2020-08-14 LAB — CUP PACEART REMOTE DEVICE CHECK
Date Time Interrogation Session: 20210911232716
Implantable Pulse Generator Implant Date: 20181106

## 2020-08-16 NOTE — Progress Notes (Signed)
Carelink Summary Report / Loop Recorder 

## 2020-09-16 ENCOUNTER — Ambulatory Visit (INDEPENDENT_AMBULATORY_CARE_PROVIDER_SITE_OTHER): Payer: Medicare Other

## 2020-09-16 DIAGNOSIS — I639 Cerebral infarction, unspecified: Secondary | ICD-10-CM | POA: Diagnosis not present

## 2020-09-17 LAB — CUP PACEART REMOTE DEVICE CHECK
Date Time Interrogation Session: 20211014233525
Implantable Pulse Generator Implant Date: 20181106

## 2020-09-21 NOTE — Progress Notes (Signed)
Carelink Summary Report / Loop Recorder 

## 2020-10-19 ENCOUNTER — Ambulatory Visit (INDEPENDENT_AMBULATORY_CARE_PROVIDER_SITE_OTHER): Payer: Medicare Other

## 2020-10-19 DIAGNOSIS — I639 Cerebral infarction, unspecified: Secondary | ICD-10-CM

## 2020-10-19 LAB — CUP PACEART REMOTE DEVICE CHECK
Date Time Interrogation Session: 20211116225124
Implantable Pulse Generator Implant Date: 20181106

## 2020-10-21 NOTE — Progress Notes (Signed)
Carelink Summary Report / Loop Recorder 

## 2020-10-25 DIAGNOSIS — F17210 Nicotine dependence, cigarettes, uncomplicated: Secondary | ICD-10-CM | POA: Diagnosis not present

## 2020-10-25 DIAGNOSIS — E559 Vitamin D deficiency, unspecified: Secondary | ICD-10-CM | POA: Diagnosis not present

## 2020-10-25 DIAGNOSIS — Z0000 Encounter for general adult medical examination without abnormal findings: Secondary | ICD-10-CM | POA: Diagnosis not present

## 2020-10-25 DIAGNOSIS — Z23 Encounter for immunization: Secondary | ICD-10-CM | POA: Diagnosis not present

## 2020-10-25 DIAGNOSIS — Z79899 Other long term (current) drug therapy: Secondary | ICD-10-CM | POA: Diagnosis not present

## 2020-10-25 DIAGNOSIS — Z1159 Encounter for screening for other viral diseases: Secondary | ICD-10-CM | POA: Diagnosis not present

## 2020-10-25 DIAGNOSIS — R5383 Other fatigue: Secondary | ICD-10-CM | POA: Diagnosis not present

## 2020-10-25 DIAGNOSIS — I10 Essential (primary) hypertension: Secondary | ICD-10-CM | POA: Diagnosis not present

## 2020-10-25 DIAGNOSIS — R0602 Shortness of breath: Secondary | ICD-10-CM | POA: Diagnosis not present

## 2020-10-25 DIAGNOSIS — E7800 Pure hypercholesterolemia, unspecified: Secondary | ICD-10-CM | POA: Diagnosis not present

## 2020-11-18 ENCOUNTER — Other Ambulatory Visit: Payer: Self-pay | Admitting: Family Medicine

## 2020-11-18 DIAGNOSIS — Z87891 Personal history of nicotine dependence: Secondary | ICD-10-CM

## 2020-11-21 ENCOUNTER — Ambulatory Visit (INDEPENDENT_AMBULATORY_CARE_PROVIDER_SITE_OTHER): Payer: Medicare Other

## 2020-11-21 DIAGNOSIS — I639 Cerebral infarction, unspecified: Secondary | ICD-10-CM | POA: Diagnosis not present

## 2020-11-21 LAB — CUP PACEART REMOTE DEVICE CHECK
Date Time Interrogation Session: 20211219225126
Implantable Pulse Generator Implant Date: 20181106

## 2020-11-30 ENCOUNTER — Other Ambulatory Visit: Payer: Self-pay | Admitting: Family Medicine

## 2020-11-30 DIAGNOSIS — E2839 Other primary ovarian failure: Secondary | ICD-10-CM

## 2020-12-05 NOTE — Progress Notes (Signed)
Carelink Summary Report / Loop Recorder 

## 2020-12-24 LAB — CUP PACEART REMOTE DEVICE CHECK
Date Time Interrogation Session: 20220121230433
Implantable Pulse Generator Implant Date: 20181106

## 2020-12-26 ENCOUNTER — Ambulatory Visit (INDEPENDENT_AMBULATORY_CARE_PROVIDER_SITE_OTHER): Payer: Medicare Other

## 2020-12-26 DIAGNOSIS — I639 Cerebral infarction, unspecified: Secondary | ICD-10-CM

## 2021-01-06 NOTE — Progress Notes (Signed)
Carelink Summary Report / Loop Recorder 

## 2021-01-28 LAB — CUP PACEART REMOTE DEVICE CHECK
Date Time Interrogation Session: 20220223230556
Implantable Pulse Generator Implant Date: 20181106

## 2021-01-30 ENCOUNTER — Ambulatory Visit (INDEPENDENT_AMBULATORY_CARE_PROVIDER_SITE_OTHER): Payer: Medicare Other

## 2021-01-30 DIAGNOSIS — I639 Cerebral infarction, unspecified: Secondary | ICD-10-CM | POA: Diagnosis not present

## 2021-02-07 NOTE — Progress Notes (Signed)
Carelink Summary Report / Loop Recorder 

## 2021-02-28 ENCOUNTER — Ambulatory Visit (INDEPENDENT_AMBULATORY_CARE_PROVIDER_SITE_OTHER): Payer: Medicare Other

## 2021-02-28 DIAGNOSIS — I639 Cerebral infarction, unspecified: Secondary | ICD-10-CM | POA: Diagnosis not present

## 2021-02-28 LAB — CUP PACEART REMOTE DEVICE CHECK
Date Time Interrogation Session: 20220329001808
Implantable Pulse Generator Implant Date: 20181106

## 2021-03-14 NOTE — Progress Notes (Signed)
Carelink Summary Report / Loop Recorder 

## 2021-03-17 ENCOUNTER — Other Ambulatory Visit: Payer: Medicare Other

## 2021-03-31 ENCOUNTER — Telehealth: Payer: Self-pay

## 2021-03-31 NOTE — Telephone Encounter (Signed)
[facility] alert received- New episodes available, need manual transmission for data from episodes 6 and 7.  Pt implanted for cryptogenic stroke, no documented AF. Previous episodes noted have been SR with ectopy.    LVM for pt with device clinic # to return call.

## 2021-04-03 ENCOUNTER — Ambulatory Visit (INDEPENDENT_AMBULATORY_CARE_PROVIDER_SITE_OTHER): Payer: Medicare Other

## 2021-04-03 DIAGNOSIS — I639 Cerebral infarction, unspecified: Secondary | ICD-10-CM | POA: Diagnosis not present

## 2021-04-04 LAB — CUP PACEART REMOTE DEVICE CHECK
Date Time Interrogation Session: 20220501001946
Implantable Pulse Generator Implant Date: 20181106

## 2021-04-04 NOTE — Telephone Encounter (Signed)
LMOM to call device clinic with call back #. Need manual transmission.

## 2021-04-06 NOTE — Telephone Encounter (Signed)
LMOVM for patient to send manual transmission. °

## 2021-04-18 NOTE — Telephone Encounter (Signed)
Certified letter sent 04/18/2021.

## 2021-04-24 NOTE — Progress Notes (Signed)
Carelink Summary Report / Loop Recorder 

## 2021-05-10 ENCOUNTER — Telehealth: Payer: Self-pay

## 2021-05-10 NOTE — Telephone Encounter (Signed)
Attempted to contact patient to discuss ILR battery dead. Unable to leave VM d/t mailbox full. Will check back at later date.

## 2021-05-11 NOTE — Telephone Encounter (Signed)
2nd attempt to contact patient about ILR. No answer, unable to leave VM d/t mailbox is full. Attempted to contact Kathleen [name] on file, same number as the patient. No other number on file to call. Will have to call back at later date.

## 2021-05-16 NOTE — Telephone Encounter (Signed)
3rd attempt to contact patient. No answer. LMTCB.    Certified letter sent.

## 2021-05-25 ENCOUNTER — Telehealth: Payer: Self-pay

## 2021-05-25 NOTE — Telephone Encounter (Signed)
Attempted to return patient's call received voicemail with full mailbox.

## 2021-05-25 NOTE — Telephone Encounter (Signed)
I let the patient know she reach Rrt. I let her know the nurse is going to call her back and she is calling from her home number.

## 2021-05-31 ENCOUNTER — Telehealth: Payer: Self-pay

## 2021-05-31 NOTE — Telephone Encounter (Signed)
2nd attempt to contact patient. No answer, LMTCB. 

## 2021-06-01 NOTE — Telephone Encounter (Signed)
2nd attempt to contact patient to discuss ILR explant or leave in.  No answer, LMTCB.

## 2021-06-02 NOTE — Telephone Encounter (Signed)
Spoke with pt, advised device at RRT.  She does not want it removed.  Pt v/u to unplug monitor, a return kit requested via [facility].

## 2021-08-29 ENCOUNTER — Ambulatory Visit
Admission: RE | Admit: 2021-08-29 | Discharge: 2021-08-29 | Disposition: A | Discharge status: Home / Self Care | Payer: Medicare Other | Source: Ambulatory Visit | Attending: Family Medicine | Admitting: Family Medicine

## 2021-08-29 ENCOUNTER — Other Ambulatory Visit: Payer: Self-pay

## 2021-08-29 DIAGNOSIS — E2839 Other primary ovarian failure: Secondary | ICD-10-CM

## 2021-11-07 ENCOUNTER — Ambulatory Visit: Payer: Medicare Other | Admitting: Obstetrics

## 2023-08-25 ENCOUNTER — Emergency Department (HOSPITAL_COMMUNITY)
Admission: EM | Admit: 2023-08-25 | Discharge: 2023-08-25 | Disposition: A | Discharge status: Home / Self Care | Payer: 59 | Attending: Emergency Medicine | Admitting: Emergency Medicine

## 2023-08-25 DIAGNOSIS — S0993XA Unspecified injury of face, initial encounter: Secondary | ICD-10-CM | POA: Diagnosis present

## 2023-08-25 DIAGNOSIS — I10 Essential (primary) hypertension: Secondary | ICD-10-CM | POA: Insufficient documentation

## 2023-08-25 DIAGNOSIS — Z23 Encounter for immunization: Secondary | ICD-10-CM | POA: Diagnosis not present

## 2023-08-25 DIAGNOSIS — W19XXXA Unspecified fall, initial encounter: Secondary | ICD-10-CM

## 2023-08-25 DIAGNOSIS — Y92000 Kitchen of unspecified non-institutional (private) residence as  the place of occurrence of the external cause: Secondary | ICD-10-CM | POA: Insufficient documentation

## 2023-08-25 DIAGNOSIS — S01511A Laceration without foreign body of lip, initial encounter: Secondary | ICD-10-CM | POA: Diagnosis not present

## 2023-08-25 DIAGNOSIS — W010XXA Fall on same level from slipping, tripping and stumbling without subsequent striking against object, initial encounter: Secondary | ICD-10-CM | POA: Diagnosis not present

## 2023-08-25 MED ORDER — LIDOCAINE-EPINEPHRINE (PF) 2 %-1:200000 IJ SOLN
10.0000 mL | Freq: Once | INTRAMUSCULAR | Status: AC
  Administered 2023-08-25: 10 mL
  Filled 2023-08-25: qty 20

## 2023-08-25 MED ORDER — MIDAZOLAM HCL 2 MG/2ML IJ SOLN
1.0000 mg | Freq: Once | INTRAMUSCULAR | Status: AC
  Administered 2023-08-25: 1 mg via INTRAMUSCULAR
  Filled 2023-08-25: qty 2

## 2023-08-25 MED ORDER — TETANUS-DIPHTH-ACELL PERTUSSIS 5-2.5-18.5 LF-MCG/0.5 IM SUSY
0.5000 mL | PREFILLED_SYRINGE | Freq: Once | INTRAMUSCULAR | Status: AC
  Administered 2023-08-25: 0.5 mL via INTRAMUSCULAR
  Filled 2023-08-25: qty 0.5

## 2023-08-25 MED ORDER — LIDOCAINE-EPINEPHRINE-TETRACAINE (LET) TOPICAL GEL
3.0000 mL | Freq: Once | TOPICAL | Status: DC
  Filled 2023-08-25 (×2): qty 3

## 2023-08-25 NOTE — Discharge Instructions (Addendum)
Evaluation today of your fall and lip laceration was overall reassuring.  Laceration repair went very well.  I placed a 5 absorbable sutures and the laceration came back together nicely.  Recommend you follow-up with your PCP in 4 to 5 days for reevaluation.  If you notice any signs of infection in your lip which includes redness, pain, oozing pustulant discharge or you develop a fever or any other concerning symptom please return emerged part for further evaluation.

## 2023-08-25 NOTE — ED Provider Notes (Signed)
[place] EMERGENCY DEPARTMENT AT [facility] Provider Note   CSN: [id number] Arrival date & time: 08/25/23  0750     History  Chief Complaint  Patient presents with   Fall   HPI Kathleen [name] is a 62 y.o. female with hypertension, history of CVA, and hyperlipidemia presenting for fall. States this morning she was in the kitchen when she tripped over her dog she fell to the ground landing on her right arm and then struck her lip on the ground.  Now with a 2 cm laceration in the inside art of her lower lip. States there was some bleeding initially but stopped shortly after the fall.  Denies headache, visual disturbance.  Denies preceding dizziness, syncope, chest pain or shortness of breath.  Denies any pain in her upper extremities.  States she had some discomfort in her right hip after the fall but it was improved this morning.  No issues with ambulation today.  Unsure of last tetanus shot.   Fall       Home Medications Prior to Admission medications   Medication Sig Start Date End Date Taking? Authorizing Provider  aspirin 325 MG tablet Take 1 tablet (325 mg total) by mouth daily. 07/08/17   Lizbeth Bark, FNP  atorvastatin (LIPITOR) 40 MG tablet Take 1 tablet (40 mg total) by mouth daily at 6 PM. 09/03/17   Lizbeth Bark, FNP  Cholecalciferol (VITAMIN D PO) Take by mouth. 5000 u daily    [provider]  cyclobenzaprine (FLEXERIL) 10 MG tablet Take 1 tablet (10 mg total) by mouth 2 (two) times daily as needed for muscle spasms. 01/05/20   Cristie Hem, PA-C  hydrochlorothiazide (HYDRODIURIL) 25 MG tablet Take 0.5 tablets (12.5 mg total) by mouth daily. 09/19/17   Lennette Bihari, MD  losartan (COZAAR) 100 MG tablet Take 1 tablet (100 mg total) by mouth daily. Patient taking differently: Take 50 mg by mouth daily.  09/03/17   Lizbeth Bark, FNP      Allergies    Hydrocodone, Lisinopril, and Tramadol    Review of Systems   See HPI for  pertinent positives   Physical Exam   Vitals:   08/25/23 0755  BP: (!) 125/94  Pulse: 82  Resp: 16  Temp: 98.2 F (36.8 C)  SpO2: 100%    CONSTITUTIONAL:  well-appearing, NAD NEURO:  GCS 15. Speech is goal oriented. No deficits appreciated to CN III-XII; symmetric eyebrow raise, no facial drooping, tongue midline. Patient has equal grip strength bilaterally with 5/5 strength against resistance in all major muscle groups bilaterally. Sensation to light touch intact. Patient moves extremities without ataxia. Normal finger-nose-finger. Patient ambulatory with steady gait. Head: atraumatic EYES:  eyes equal and reactive ENT/NECK:  Supple, no stridor. 2 cm laceration noted to the posterior/interior aspect of the lower lip. No trauma noted to teeth or gums.  CARDIO:  regular rate and rhythm, appears well-perfused  PULM:  No respiratory distress, CTAB GI/GU:  non-distended, soft MSK/SPINE:  No gross deformities, no edema, moves all extremities  SKIN:  no rash, atraumatic  *Additional and/or pertinent findings included in MDM below  ED Results / Procedures / Treatments   Labs (all labs ordered are listed, but only abnormal results are displayed) Labs Reviewed - No data to display  EKG None  Radiology No results found.  Procedures .Marland KitchenLaceration Repair  Date/Time: 08/25/2023 9:31 AM  Performed by: Gareth Eagle, PA-C Authorized by: Gareth Eagle, PA-C  Consent:    Consent obtained:  Verbal   Consent given by:  Patient   Risks discussed:  Infection, retained foreign body and need for additional repair Universal protocol:    Procedure explained and questions answered to patient or proxy's satisfaction: yes     Relevant documents present and verified: yes     Patient identity confirmed:  Verbally with patient and arm band Anesthesia:    Anesthesia method:  Nerve block   Block location:  Bilateral mental block   Block needle gauge:  25 G   Block anesthetic:  Lidocaine  1% WITH epi Laceration details:    Location:  Lip   Lip location:  Lower interior lip   Length (cm):  2   Depth (mm):  5 Pre-procedure details:    Preparation:  Patient was prepped and draped in usual sterile fashion Exploration:    Limited defect created (wound extended): yes     Hemostasis achieved with:  Direct pressure   Imaging outcome: foreign body noted     Wound exploration: wound explored through full range of motion     Wound extent: areolar tissue violated   Treatment:    Area cleansed with:  Saline   Amount of cleaning:  Extensive   Irrigation solution:  Sterile saline   Irrigation volume:  500 ml   Irrigation method:  Pressure wash   Visualized foreign bodies/material removed: no     Undermining:  None Skin repair:    Repair method:  Sutures   Suture size:  4-0   Suture material:  Chromic gut   Suture technique:  Simple interrupted   Number of sutures:  5 Approximation:    Approximation:  Close Repair type:    Repair type:  Simple Post-procedure details:    Dressing:  Open (no dressing)   Procedure completion:  Tolerated well, no immediate complications     Medications Ordered in ED Medications  lidocaine-EPINEPHrine-tetracaine (LET) topical gel (3 mLs Topical Not Given 08/25/23 0933)  Tdap (BOOSTRIX) injection 0.5 mL (has no administration in time range)  midazolam (VERSED) injection 1 mg (1 mg Intramuscular Given 08/25/23 0848)  lidocaine-EPINEPHrine (XYLOCAINE W/EPI) 2 %-1:200000 (PF) injection 10 mL (10 mLs Infiltration Given 08/25/23 0848)     ED Course/ Medical Decision Making/ A&P                                 Medical Decision Making  62 year old well-appearing female presenting for fall and lip laceration.  Exam notable for 2 cm anterior lower lip laceration.  Laceration repair was well-tolerated.  Given how well she appears with reassuring physical exam and atraumatic head and neck have low suspicion for any other traumatic injuries to her  head neck.  Gave tetanus shot.  Advised to follow-up PCP.  Discussed return precautions.  Discharged.        Final Clinical Impression(s) / ED Diagnoses Final diagnoses:  Fall, initial encounter  Lip laceration, initial encounter    Rx / DC Orders ED Discharge Orders     None         Gareth Eagle, PA-C 08/25/23 0942    Rexford Maus, DO 08/25/23 1002

## 2023-08-25 NOTE — ED Triage Notes (Signed)
Pt bib POV from home. Pt was in the kitchen when she fell and hit her lip on the floor while bracing herself.

## 2024-09-21 ENCOUNTER — Encounter (HOSPITAL_COMMUNITY): Payer: Self-pay | Admitting: Emergency Medicine

## 2024-09-21 ENCOUNTER — Other Ambulatory Visit: Payer: Self-pay

## 2024-09-21 ENCOUNTER — Emergency Department (HOSPITAL_COMMUNITY)
Admission: EM | Admit: 2024-09-21 | Discharge: 2024-09-22 | Disposition: A | Attending: Emergency Medicine | Admitting: Emergency Medicine

## 2024-09-21 DIAGNOSIS — Z79899 Other long term (current) drug therapy: Secondary | ICD-10-CM | POA: Diagnosis not present

## 2024-09-21 DIAGNOSIS — Z7982 Long term (current) use of aspirin: Secondary | ICD-10-CM | POA: Insufficient documentation

## 2024-09-21 DIAGNOSIS — M5432 Sciatica, left side: Secondary | ICD-10-CM | POA: Insufficient documentation

## 2024-09-21 DIAGNOSIS — M79605 Pain in left leg: Secondary | ICD-10-CM | POA: Diagnosis present

## 2024-09-21 DIAGNOSIS — I10 Essential (primary) hypertension: Secondary | ICD-10-CM | POA: Insufficient documentation

## 2024-09-21 DIAGNOSIS — Z7952 Long term (current) use of systemic steroids: Secondary | ICD-10-CM | POA: Diagnosis not present

## 2024-09-21 DIAGNOSIS — Z8673 Personal history of transient ischemic attack (TIA), and cerebral infarction without residual deficits: Secondary | ICD-10-CM | POA: Diagnosis not present

## 2024-09-21 DIAGNOSIS — M25562 Pain in left knee: Secondary | ICD-10-CM

## 2024-09-21 DIAGNOSIS — M79662 Pain in left lower leg: Secondary | ICD-10-CM | POA: Diagnosis not present

## 2024-09-21 DIAGNOSIS — J45909 Unspecified asthma, uncomplicated: Secondary | ICD-10-CM | POA: Insufficient documentation

## 2024-09-21 MED ORDER — ACETAMINOPHEN 325 MG PO TABS
650.0000 mg | ORAL_TABLET | Freq: Once | ORAL | Status: AC
  Administered 2024-09-21: 650 mg via ORAL
  Filled 2024-09-21: qty 2

## 2024-09-21 NOTE — ED Triage Notes (Signed)
 Pt additionally adds she developed L calf and L hamstring pain 1 week ago, which she feels is a combination of her fall onto knee last Saturday and frequent bending over. Pt has been taking Meloxicam  with no relief.

## 2024-09-21 NOTE — ED Provider Triage Note (Signed)
 Emergency Medicine Provider Triage Evaluation Note  Kathleen [name] , a 63 y.o. female  was evaluated in triage.  Pt complains of left calf and thigh pain that started 1 week ago.  She did report falling onto her left knee prior to this starting, but she denies any significant pain in her left knee now.  She denies any numbness or tingling in the lower extremity.  She has been taking ibuprofen  at home without relief of pain.  Review of Systems  Positive: As above Negative: As above  Physical Exam  BP (!) 124/94   Pulse 87   Temp 98.1 F (36.7 C)   Resp 18   Wt 95.3 kg   SpO2 100%   BMI 33.89 kg/m  Gen:   Awake, no distress   Resp:  Normal effort  MSK:   Moves extremities without difficulty  Other:  Left calf non-tender to palpation, no significant lower extremity edema  Medical Decision Making  Medically screening exam initiated at 8:20 PM.  Appropriate orders placed.  Kathleen [name] was informed that the remainder of the evaluation will be completed by another provider, this initial triage assessment does not replace that evaluation, and the importance of remaining in the ED until their evaluation is complete.     Veta Palma, PA-C 09/21/24 2020

## 2024-09-21 NOTE — ED Triage Notes (Signed)
 PT here for 9/10  left knee pain since Saturday when her legs went out and she fell.  No other injuries. Walking with a limp.

## 2024-09-22 ENCOUNTER — Emergency Department (EMERGENCY_DEPARTMENT_HOSPITAL)

## 2024-09-22 DIAGNOSIS — M79662 Pain in left lower leg: Secondary | ICD-10-CM | POA: Diagnosis not present

## 2024-09-22 MED ORDER — KETOROLAC TROMETHAMINE 60 MG/2ML IM SOLN
60.0000 mg | Freq: Once | INTRAMUSCULAR | Status: AC
  Administered 2024-09-22: 60 mg via INTRAMUSCULAR
  Filled 2024-09-22: qty 2

## 2024-09-22 MED ORDER — PREDNISONE 20 MG PO TABS: ORAL_TABLET | ORAL | 0 refills | Status: AC

## 2024-09-22 MED ORDER — LIDOCAINE 5 % EX PTCH: 1.0000 | MEDICATED_PATCH | CUTANEOUS | 0 refills | Status: AC

## 2024-09-22 MED ORDER — METHOCARBAMOL 500 MG PO TABS: 500.0000 mg | ORAL_TABLET | Freq: Two times a day (BID) | ORAL | 0 refills | Status: AC

## 2024-09-22 MED ORDER — PREDNISONE 20 MG PO TABS
60.0000 mg | ORAL_TABLET | Freq: Once | ORAL | Status: AC
  Administered 2024-09-22: 60 mg via ORAL
  Filled 2024-09-22: qty 3

## 2024-09-22 MED ORDER — LIDOCAINE 5 % EX PTCH
3.0000 | MEDICATED_PATCH | CUTANEOUS | Status: DC
  Administered 2024-09-22: 3 via TRANSDERMAL
  Filled 2024-09-22: qty 3

## 2024-09-22 NOTE — Progress Notes (Signed)
 Left lower extremity venous duplex has been completed.  Results can be found in chart review under CV Proc.  09/22/2024 10:51 AM  Jef Futch Elden Appl, RVT.
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
## 2024-09-22 NOTE — Discharge Instructions (Addendum)
 As discussed, your evaluation today has been largely reassuring.  But, it is important that you monitor your condition carefully, and do not hesitate to return to the ED if you develop new, or concerning changes in your condition. ? ?Otherwise, please follow-up with your physician for appropriate ongoing care. ? ?

## 2024-09-22 NOTE — ED Provider Notes (Signed)
 11:09 AM Care of the patient assumed at signout.  Now after ultrasound is available, results discussed with the patient, reassuring, no evidence for DVT, she will follow-up with primary care.   Garrick Charleston, MD 09/22/24 [phone]
